# Patient Record
Sex: Female | Born: 1935 | Race: White | Hispanic: No | Smoking: Never smoker
Health system: Southern US, Community
[De-identification: ages and names within clinical notes are randomized; demographics above are authoritative.]

## PROBLEM LIST (undated history)

## (undated) DIAGNOSIS — E785 Hyperlipidemia, unspecified: Secondary | ICD-10-CM

## (undated) DIAGNOSIS — I714 Abdominal aortic aneurysm, without rupture, unspecified: Secondary | ICD-10-CM

## (undated) DIAGNOSIS — I442 Atrioventricular block, complete: Secondary | ICD-10-CM

## (undated) DIAGNOSIS — I10 Essential (primary) hypertension: Secondary | ICD-10-CM

## (undated) DIAGNOSIS — R42 Dizziness and giddiness: Secondary | ICD-10-CM

## (undated) HISTORY — PX: CHOLECYSTECTOMY: SHX55

## (undated) HISTORY — DX: Hyperlipidemia, unspecified: E78.5

## (undated) HISTORY — PX: TONSILLECTOMY: SUR1361

## (undated) HISTORY — PX: APPENDECTOMY: SHX54

---

## 2011-04-01 DIAGNOSIS — I517 Cardiomegaly: Secondary | ICD-10-CM

## 2011-04-01 DIAGNOSIS — I10 Essential (primary) hypertension: Secondary | ICD-10-CM

## 2012-06-13 DIAGNOSIS — R931 Abnormal findings on diagnostic imaging of heart and coronary circulation: Secondary | ICD-10-CM | POA: Insufficient documentation

## 2012-06-13 DIAGNOSIS — R011 Cardiac murmur, unspecified: Secondary | ICD-10-CM | POA: Insufficient documentation

## 2012-06-13 DIAGNOSIS — R9431 Abnormal electrocardiogram [ECG] [EKG]: Secondary | ICD-10-CM | POA: Insufficient documentation

## 2012-06-13 DIAGNOSIS — I4510 Unspecified right bundle-branch block: Secondary | ICD-10-CM | POA: Insufficient documentation

## 2015-05-26 DIAGNOSIS — I10 Essential (primary) hypertension: Secondary | ICD-10-CM | POA: Diagnosis not present

## 2015-05-26 DIAGNOSIS — Z1389 Encounter for screening for other disorder: Secondary | ICD-10-CM | POA: Diagnosis not present

## 2015-05-26 DIAGNOSIS — Z6821 Body mass index (BMI) 21.0-21.9, adult: Secondary | ICD-10-CM | POA: Diagnosis not present

## 2015-05-26 DIAGNOSIS — Z0000 Encounter for general adult medical examination without abnormal findings: Secondary | ICD-10-CM | POA: Diagnosis not present

## 2015-06-10 DIAGNOSIS — Z9049 Acquired absence of other specified parts of digestive tract: Secondary | ICD-10-CM | POA: Diagnosis not present

## 2015-06-10 DIAGNOSIS — R1084 Generalized abdominal pain: Secondary | ICD-10-CM | POA: Diagnosis not present

## 2015-06-10 DIAGNOSIS — I714 Abdominal aortic aneurysm, without rupture: Secondary | ICD-10-CM | POA: Diagnosis not present

## 2015-09-01 DIAGNOSIS — Z0000 Encounter for general adult medical examination without abnormal findings: Secondary | ICD-10-CM | POA: Diagnosis not present

## 2015-09-01 DIAGNOSIS — I10 Essential (primary) hypertension: Secondary | ICD-10-CM | POA: Diagnosis not present

## 2015-09-01 DIAGNOSIS — M810 Age-related osteoporosis without current pathological fracture: Secondary | ICD-10-CM | POA: Diagnosis not present

## 2015-09-01 DIAGNOSIS — Z6820 Body mass index (BMI) 20.0-20.9, adult: Secondary | ICD-10-CM | POA: Diagnosis not present

## 2015-09-01 DIAGNOSIS — R55 Syncope and collapse: Secondary | ICD-10-CM | POA: Diagnosis not present

## 2015-09-08 DIAGNOSIS — R55 Syncope and collapse: Secondary | ICD-10-CM | POA: Diagnosis not present

## 2015-09-08 DIAGNOSIS — I358 Other nonrheumatic aortic valve disorders: Secondary | ICD-10-CM | POA: Diagnosis not present

## 2015-09-08 DIAGNOSIS — G458 Other transient cerebral ischemic attacks and related syndromes: Secondary | ICD-10-CM | POA: Diagnosis not present

## 2015-09-08 DIAGNOSIS — I517 Cardiomegaly: Secondary | ICD-10-CM | POA: Diagnosis not present

## 2015-12-08 DIAGNOSIS — Z23 Encounter for immunization: Secondary | ICD-10-CM | POA: Diagnosis not present

## 2015-12-08 DIAGNOSIS — E784 Other hyperlipidemia: Secondary | ICD-10-CM | POA: Diagnosis not present

## 2015-12-08 DIAGNOSIS — Z6821 Body mass index (BMI) 21.0-21.9, adult: Secondary | ICD-10-CM | POA: Diagnosis not present

## 2015-12-08 DIAGNOSIS — M810 Age-related osteoporosis without current pathological fracture: Secondary | ICD-10-CM | POA: Diagnosis not present

## 2015-12-08 DIAGNOSIS — I10 Essential (primary) hypertension: Secondary | ICD-10-CM | POA: Diagnosis not present

## 2016-03-22 DIAGNOSIS — M810 Age-related osteoporosis without current pathological fracture: Secondary | ICD-10-CM | POA: Diagnosis not present

## 2016-03-22 DIAGNOSIS — Z6821 Body mass index (BMI) 21.0-21.9, adult: Secondary | ICD-10-CM | POA: Diagnosis not present

## 2016-03-22 DIAGNOSIS — E784 Other hyperlipidemia: Secondary | ICD-10-CM | POA: Diagnosis not present

## 2016-03-22 DIAGNOSIS — I10 Essential (primary) hypertension: Secondary | ICD-10-CM | POA: Diagnosis not present

## 2016-06-28 DIAGNOSIS — E784 Other hyperlipidemia: Secondary | ICD-10-CM | POA: Diagnosis not present

## 2016-06-28 DIAGNOSIS — I872 Venous insufficiency (chronic) (peripheral): Secondary | ICD-10-CM | POA: Diagnosis not present

## 2016-06-28 DIAGNOSIS — M810 Age-related osteoporosis without current pathological fracture: Secondary | ICD-10-CM | POA: Diagnosis not present

## 2016-06-28 DIAGNOSIS — I10 Essential (primary) hypertension: Secondary | ICD-10-CM | POA: Diagnosis not present

## 2016-10-11 DIAGNOSIS — E784 Other hyperlipidemia: Secondary | ICD-10-CM | POA: Diagnosis not present

## 2016-10-11 DIAGNOSIS — I10 Essential (primary) hypertension: Secondary | ICD-10-CM | POA: Diagnosis not present

## 2016-10-11 DIAGNOSIS — I872 Venous insufficiency (chronic) (peripheral): Secondary | ICD-10-CM | POA: Diagnosis not present

## 2016-10-11 DIAGNOSIS — M810 Age-related osteoporosis without current pathological fracture: Secondary | ICD-10-CM | POA: Diagnosis not present

## 2017-01-17 DIAGNOSIS — I872 Venous insufficiency (chronic) (peripheral): Secondary | ICD-10-CM | POA: Diagnosis not present

## 2017-01-17 DIAGNOSIS — I10 Essential (primary) hypertension: Secondary | ICD-10-CM | POA: Diagnosis not present

## 2017-01-17 DIAGNOSIS — E7849 Other hyperlipidemia: Secondary | ICD-10-CM | POA: Diagnosis not present

## 2017-01-17 DIAGNOSIS — M810 Age-related osteoporosis without current pathological fracture: Secondary | ICD-10-CM | POA: Diagnosis not present

## 2017-04-25 DIAGNOSIS — E7849 Other hyperlipidemia: Secondary | ICD-10-CM | POA: Diagnosis not present

## 2017-04-25 DIAGNOSIS — M810 Age-related osteoporosis without current pathological fracture: Secondary | ICD-10-CM | POA: Diagnosis not present

## 2017-04-25 DIAGNOSIS — I10 Essential (primary) hypertension: Secondary | ICD-10-CM | POA: Diagnosis not present

## 2017-04-25 DIAGNOSIS — N181 Chronic kidney disease, stage 1: Secondary | ICD-10-CM | POA: Diagnosis not present

## 2017-05-30 DIAGNOSIS — M4184 Other forms of scoliosis, thoracic region: Secondary | ICD-10-CM | POA: Diagnosis not present

## 2017-05-30 DIAGNOSIS — Z6822 Body mass index (BMI) 22.0-22.9, adult: Secondary | ICD-10-CM | POA: Diagnosis not present

## 2017-05-30 DIAGNOSIS — M4804 Spinal stenosis, thoracic region: Secondary | ICD-10-CM | POA: Diagnosis not present

## 2017-05-30 DIAGNOSIS — M545 Low back pain: Secondary | ICD-10-CM | POA: Diagnosis not present

## 2017-06-09 DIAGNOSIS — I700 Atherosclerosis of aorta: Secondary | ICD-10-CM | POA: Diagnosis not present

## 2017-07-10 ENCOUNTER — Inpatient Hospital Stay (HOSPITAL_COMMUNITY)
Admission: AD | Admit: 2017-07-10 | Discharge: 2017-07-13 | DRG: 244 | Disposition: A | Discharge status: Home / Self Care | Payer: Medicare Other | Source: Other Acute Inpatient Hospital | Attending: Internal Medicine | Admitting: Internal Medicine

## 2017-07-10 DIAGNOSIS — I700 Atherosclerosis of aorta: Secondary | ICD-10-CM | POA: Diagnosis not present

## 2017-07-10 DIAGNOSIS — Z79899 Other long term (current) drug therapy: Secondary | ICD-10-CM

## 2017-07-10 DIAGNOSIS — R55 Syncope and collapse: Secondary | ICD-10-CM | POA: Diagnosis present

## 2017-07-10 DIAGNOSIS — I10 Essential (primary) hypertension: Secondary | ICD-10-CM | POA: Diagnosis present

## 2017-07-10 DIAGNOSIS — J90 Pleural effusion, not elsewhere classified: Secondary | ICD-10-CM | POA: Diagnosis not present

## 2017-07-10 DIAGNOSIS — Z950 Presence of cardiac pacemaker: Secondary | ICD-10-CM | POA: Diagnosis not present

## 2017-07-10 DIAGNOSIS — R001 Bradycardia, unspecified: Secondary | ICD-10-CM | POA: Diagnosis not present

## 2017-07-10 DIAGNOSIS — I442 Atrioventricular block, complete: Principal | ICD-10-CM | POA: Diagnosis present

## 2017-07-10 DIAGNOSIS — R7989 Other specified abnormal findings of blood chemistry: Secondary | ICD-10-CM | POA: Diagnosis present

## 2017-07-10 DIAGNOSIS — E785 Hyperlipidemia, unspecified: Secondary | ICD-10-CM | POA: Diagnosis present

## 2017-07-10 DIAGNOSIS — I4891 Unspecified atrial fibrillation: Secondary | ICD-10-CM | POA: Diagnosis not present

## 2017-07-10 DIAGNOSIS — R42 Dizziness and giddiness: Secondary | ICD-10-CM | POA: Diagnosis not present

## 2017-07-10 DIAGNOSIS — Z8249 Family history of ischemic heart disease and other diseases of the circulatory system: Secondary | ICD-10-CM

## 2017-07-10 DIAGNOSIS — I714 Abdominal aortic aneurysm, without rupture: Secondary | ICD-10-CM | POA: Diagnosis not present

## 2017-07-10 DIAGNOSIS — J9811 Atelectasis: Secondary | ICD-10-CM | POA: Diagnosis not present

## 2017-07-10 DIAGNOSIS — K449 Diaphragmatic hernia without obstruction or gangrene: Secondary | ICD-10-CM | POA: Diagnosis not present

## 2017-07-10 DIAGNOSIS — Z7983 Long term (current) use of bisphosphonates: Secondary | ICD-10-CM

## 2017-07-10 DIAGNOSIS — E7800 Pure hypercholesterolemia, unspecified: Secondary | ICD-10-CM | POA: Diagnosis not present

## 2017-07-10 HISTORY — DX: Abdominal aortic aneurysm, without rupture, unspecified: I71.40

## 2017-07-10 HISTORY — DX: Essential (primary) hypertension: I10

## 2017-07-10 HISTORY — DX: Dizziness and giddiness: R42

## 2017-07-10 HISTORY — DX: Abdominal aortic aneurysm, without rupture: I71.4

## 2017-07-10 HISTORY — DX: Atrioventricular block, complete: I44.2

## 2017-07-11 ENCOUNTER — Other Ambulatory Visit: Payer: Self-pay

## 2017-07-11 ENCOUNTER — Encounter (HOSPITAL_COMMUNITY): Payer: Self-pay | Admitting: Cardiology

## 2017-07-11 DIAGNOSIS — E785 Hyperlipidemia, unspecified: Secondary | ICD-10-CM

## 2017-07-11 DIAGNOSIS — I442 Atrioventricular block, complete: Principal | ICD-10-CM | POA: Diagnosis present

## 2017-07-11 DIAGNOSIS — I10 Essential (primary) hypertension: Secondary | ICD-10-CM

## 2017-07-11 LAB — BASIC METABOLIC PANEL
ANION GAP: 8 (ref 5–15)
Anion gap: 10 (ref 5–15)
BUN: 29 mg/dL — ABNORMAL HIGH (ref 6–20)
BUN: 31 mg/dL — AB (ref 6–20)
CHLORIDE: 110 mmol/L (ref 101–111)
CHLORIDE: 111 mmol/L (ref 101–111)
CO2: 24 mmol/L (ref 22–32)
CO2: 25 mmol/L (ref 22–32)
CREATININE: 1.56 mg/dL — AB (ref 0.44–1.00)
Calcium: 9.6 mg/dL (ref 8.9–10.3)
Calcium: 9.2 mg/dL (ref 8.9–10.3)
Creatinine, Ser: 1.39 mg/dL — ABNORMAL HIGH (ref 0.44–1.00)
GFR calc Af Amer: 34 mL/min — ABNORMAL LOW (ref >60)
GFR calc non Af Amer: 29 mL/min — ABNORMAL LOW (ref >60)
GFR calc non Af Amer: 34 mL/min — ABNORMAL LOW (ref >60)
GFR, EST AFRICAN AMERICAN: 39 mL/min — AB (ref >60)
GLUCOSE: 119 mg/dL — AB (ref 65–99)
Glucose, Bld: 97 mg/dL (ref 65–99)
POTASSIUM: 4.8 mmol/L (ref 3.5–5.1)
Potassium: 5 mmol/L (ref 3.5–5.1)
SODIUM: 144 mmol/L (ref 135–145)
Sodium: 144 mmol/L (ref 135–145)

## 2017-07-11 LAB — CBC
HCT: 34.2 % — ABNORMAL LOW (ref 36.0–46.0)
HEMATOCRIT: 32 % — AB (ref 36.0–46.0)
HEMOGLOBIN: 10 g/dL — AB (ref 12.0–15.0)
Hemoglobin: 10.8 g/dL — ABNORMAL LOW (ref 12.0–15.0)
MCH: 28.5 pg (ref 26.0–34.0)
MCH: 29 pg (ref 26.0–34.0)
MCHC: 31.3 g/dL (ref 30.0–36.0)
MCHC: 31.6 g/dL (ref 30.0–36.0)
MCV: 91.2 fL (ref 78.0–100.0)
MCV: 91.9 fL (ref 78.0–100.0)
PLATELETS: 242 10*3/uL (ref 150–400)
Platelets: 235 10*3/uL (ref 150–400)
RBC: 3.72 MIL/uL — ABNORMAL LOW (ref 3.87–5.11)
RBC: 3.51 MIL/uL — AB (ref 3.87–5.11)
RDW: 13.3 % (ref 11.5–15.5)
RDW: 13.4 % (ref 11.5–15.5)
WBC: 11.3 10*3/uL — ABNORMAL HIGH (ref 4.0–10.5)
WBC: 7.9 10*3/uL (ref 4.0–10.5)

## 2017-07-11 LAB — HEMOGLOBIN A1C
Hgb A1c MFr Bld: 5.9 % — ABNORMAL HIGH (ref 4.8–5.6)
Mean Plasma Glucose: 122.63 mg/dL

## 2017-07-11 LAB — SURGICAL PCR SCREEN
MRSA, PCR: NEGATIVE
Staphylococcus aureus: NEGATIVE

## 2017-07-11 LAB — MAGNESIUM: MAGNESIUM: 2.2 mg/dL (ref 1.7–2.4)

## 2017-07-11 LAB — PROTIME-INR
INR: 1.05
Prothrombin Time: 13.6 seconds (ref 11.4–15.2)

## 2017-07-11 LAB — MRSA PCR SCREENING: MRSA by PCR: NEGATIVE

## 2017-07-11 LAB — LIPID PANEL
CHOLESTEROL: 148 mg/dL (ref 0–200)
HDL: 42 mg/dL (ref >40)
LDL Cholesterol: 73 mg/dL (ref 0–99)
TRIGLYCERIDES: 167 mg/dL — AB (ref <150)
Total CHOL/HDL Ratio: 3.5 RATIO
VLDL: 33 mg/dL (ref 0–40)

## 2017-07-11 LAB — TSH: TSH: 2.004 u[IU]/mL (ref 0.350–4.500)

## 2017-07-11 LAB — BRAIN NATRIURETIC PEPTIDE: B Natriuretic Peptide: 2333.8 pg/mL — ABNORMAL HIGH (ref 0.0–100.0)

## 2017-07-11 MED ORDER — CEFAZOLIN SODIUM-DEXTROSE 1-4 GM/50ML-% IV SOLN
1.0000 g | INTRAVENOUS | Status: AC
  Administered 2017-07-12: 1 g via INTRAVENOUS

## 2017-07-11 MED ORDER — CALCIUM CARBONATE-VITAMIN D 500-200 MG-UNIT PO TABS
1.0000 | ORAL_TABLET | Freq: Every day | ORAL | Status: DC
  Administered 2017-07-11 – 2017-07-13 (×3): 1 via ORAL
  Filled 2017-07-11 (×3): qty 1

## 2017-07-11 MED ORDER — ACETAMINOPHEN 325 MG PO TABS
650.0000 mg | ORAL_TABLET | ORAL | Status: DC | PRN
  Administered 2017-07-11 – 2017-07-13 (×4): 650 mg via ORAL
  Filled 2017-07-11 (×4): qty 2

## 2017-07-11 MED ORDER — LISINOPRIL 10 MG PO TABS
10.0000 mg | ORAL_TABLET | Freq: Every day | ORAL | Status: DC
  Administered 2017-07-12 – 2017-07-13 (×3): 10 mg via ORAL
  Filled 2017-07-11 (×3): qty 1

## 2017-07-11 MED ORDER — ASPIRIN 81 MG PO CHEW: 81.0000 mg | CHEWABLE_TABLET | Freq: Every day | ORAL | Status: DC

## 2017-07-11 MED ORDER — NITROGLYCERIN 0.4 MG SL SUBL: 0.4000 mg | SUBLINGUAL_TABLET | SUBLINGUAL | Status: DC | PRN

## 2017-07-11 MED ORDER — HEPARIN SODIUM (PORCINE) 5000 UNIT/ML IJ SOLN
5000.0000 [IU] | Freq: Three times a day (TID) | INTRAMUSCULAR | Status: DC
  Administered 2017-07-11 – 2017-07-13 (×5): 5000 [IU] via SUBCUTANEOUS
  Filled 2017-07-11 (×5): qty 1

## 2017-07-11 MED ORDER — HYDRALAZINE HCL 25 MG PO TABS
25.0000 mg | ORAL_TABLET | Freq: Three times a day (TID) | ORAL | Status: DC | PRN
Start: 2017-07-11 — End: 2017-07-13
  Administered 2017-07-11 – 2017-07-13 (×4): 25 mg via ORAL
  Filled 2017-07-11 (×4): qty 1

## 2017-07-11 MED ORDER — PRAVASTATIN SODIUM 40 MG PO TABS
40.0000 mg | ORAL_TABLET | Freq: Every day | ORAL | Status: DC
  Administered 2017-07-11 – 2017-07-12 (×2): 40 mg via ORAL
  Filled 2017-07-11 (×2): qty 1

## 2017-07-11 MED ORDER — ASPIRIN EC 81 MG PO TBEC
81.0000 mg | DELAYED_RELEASE_TABLET | Freq: Every day | ORAL | Status: DC
  Administered 2017-07-12 – 2017-07-13 (×2): 81 mg via ORAL
  Filled 2017-07-11 (×2): qty 1

## 2017-07-11 MED ORDER — ONDANSETRON HCL 4 MG/2ML IJ SOLN
4.0000 mg | Freq: Four times a day (QID) | INTRAMUSCULAR | Status: DC | PRN
  Administered 2017-07-12: 4 mg via INTRAVENOUS

## 2017-07-11 MED ORDER — OMEGA-3-ACID ETHYL ESTERS 1 G PO CAPS
1.0000 g | ORAL_CAPSULE | Freq: Every day | ORAL | Status: DC
  Administered 2017-07-11 – 2017-07-13 (×3): 1 g via ORAL
  Filled 2017-07-11 (×3): qty 1

## 2017-07-11 MED ORDER — SODIUM CHLORIDE 0.9 % IV SOLN
INTRAVENOUS | Status: DC
  Administered 2017-07-12: 07:00:00 via INTRAVENOUS

## 2017-07-11 MED ORDER — SODIUM CHLORIDE 0.9 % IV SOLN
80.0000 mg | INTRAVENOUS | Status: AC
  Administered 2017-07-12: 80 mg
  Filled 2017-07-11: qty 2

## 2017-07-11 NOTE — Progress Notes (Signed)
EKG CRITICAL VALUE     12 lead EKG performed.  Critical value noted.Vinnie Langton , RN notified.   Irish Elders, [place] 07/11/2017 8:02 AM

## 2017-07-11 NOTE — H&P (Signed)
Cardiology Admission History and Physical:   Patient ID: Deborah Moses; MRN: [id number]; DOB: 1931/08/03   Admission date: 07/10/2017  Primary Care Provider: Toma Deiters, MD Primary Cardiologist: Titus Mould [facility])  Chief Complaint:  CHB  Patient Profile:   Deborah Moses is a 84 y.o. female with a history of HTN, vertigo, AAA, who presented to OSH with presyncope and is found to have complete heart block.   History of Present Illness:   Deborah Moses is a 84 y.o. female with a history of HTN, vertigo, AAA, who presented to OSH with presyncope and is found to have complete heart block.   The patient has a largely unremarkable cardiac history but was evaluated by cardiology at [facility] previously after she had abnormal echocardiogram that apparently showed unspecified wall motion abnormalities. Conservative management was taken without further workup due to absence of symptoms. She also has known conduction disease with baseline RBBB.  She presented to [facility] today with episodes of lightheadedness and presyncope. She felt dizzy when ambulating and would have to sit down. She thought that she was experiencing a recurrence of her vertigo. She denied associated chest pain, dyspnea, edema or other cardiac symptoms. At the OSH ED, she was noted to be bradycardic to the 30s. ECG was obtained and showed complete heart block. CT head was performed and unremarkable, CXR showed L atelectasis with minimal L pleural effusion. Labs notable for Cr 1.7. Troponin negative.  She was transferred to [facility] for further management.  On arrival to [facility], the patient had HR 30s with SBP 150s. ECG showed complete heart block with variable RBBB-pattern escape. She is resting comfortably in bed and denies any active complaints.   Past Medical History:  Diagnosis Date  . AAA (abdominal aortic aneurysm) (HCC)   . Heart block AV third degree (HCC)   . HTN (hypertension)   . Vertigo       Medications Prior to Admission: Prior to Admission medications   Medication Sig Start Date End Date Taking? Authorizing Provider  alendronate (FOSAMAX) 70 MG tablet Take 70 mg by mouth once a week. Take with a full glass of water on an empty stomach.    [provider]  amLODipine (NORVASC) 5 MG tablet Take 5 mg by mouth daily.    [provider]  aspirin 81 MG chewable tablet Chew by mouth daily.    [provider]  calcium-vitamin D (OSCAL WITH D) 500-200 MG-UNIT TABS tablet Take by mouth.    [provider]  lisinopril (PRINIVIL,ZESTRIL) 20 MG tablet Take 20 mg by mouth daily.    [provider]  lovastatin (MEVACOR) 20 MG tablet Take 20 mg by mouth at bedtime.    [provider]  metoprolol succinate (TOPROL-XL) 25 MG 24 hr tablet Take 25 mg by mouth daily.    [provider]  Omega-3 Fatty Acids (FISH OIL) 1000 MG CPDR Take by mouth.    [provider]     Allergies:   No Known Allergies  Social History:   Social History   Socioeconomic History  . Marital status: Widowed    Spouse name: Not on file  . Number of children: 4  . Years of education: Not on file  . Highest education level: Not on file  Occupational History  . Not on file  Social Needs  . Financial resource strain: Not on file  . Food insecurity:    Worry: Not on file    Inability:  Not on file  . Transportation needs:    Medical: Not on file    Non-medical: Not on file  Tobacco Use  . Smoking status: Never Smoker  . Smokeless tobacco: Never Used  Substance and Sexual Activity  . Alcohol use: Never    Frequency: Never  . Drug use: Not on file  . Sexual activity: Not on file  Lifestyle  . Physical activity:    Days per week: Not on file    Minutes per session: Not on file  . Stress: Not on file  Relationships  . Social connections:    Talks on phone: Not on file    Gets together: Not on file    Attends religious service:  Not on file    Active member of club or organization: Not on file    Attends meetings of clubs or organizations: Not on file    Relationship status: Not on file  . Intimate partner violence:    Fear of current or ex partner: Not on file    Emotionally abused: Not on file    Physically abused: Not on file    Forced sexual activity: Not on file  Other Topics Concern  . Not on file  Social History Narrative  . Not on file    Family History:  The patient's family history includes Heart failure in her father.    ROS:  Please see the history of present illness.  All other ROS reviewed and negative.     Physical Exam/Data:   Vitals:   07/11/17 0000  Weight: 56.6 kg (124 lb 12.5 oz)  Height:  (1.575 m)   No intake or output data in the 24 hours ending 07/11/17 0057 Filed Weights   07/11/17 0000  Weight: 56.6 kg (124 lb 12.5 oz)   Body mass index is 22.82 kg/m.  General:  Well nourished, well developed, in no acute distress HEENT: poor dentition Lymph: no adenopathy Neck: no JVD. Cannon A waves present Vascular: 2+ radial pulse Cardiac:  Bradycardic, regular, II/VI systolic murmur  Lungs:  Some coarse breath sounds bilaterally Abd: soft, nontender Ext: no LE edema Musculoskeletal:  No deformities, BUE and BLE strength normal and equal Skin: warm and dry  Neuro:  No focal abnormalities noted Psych:  Normal affect    EKG:  The ECG that was done and was personally reviewed and demonstrates findings as above  Relevant CV Studies: TTE ~2012 (CareEverywhere): Ejection fraction 6065% with segmental wall motion abnormalities.   Laboratory Data:  ChemistryNo results for input(s): NA, K, CL, CO2, GLUCOSE, BUN, CREATININE, CALCIUM, GFRNONAA, GFRAA, ANIONGAP in the last 168 hours.  No results for input(s): PROT, ALBUMIN, AST, ALT, ALKPHOS, BILITOT in the last 168 hours. HematologyNo results for input(s): WBC, RBC, HGB, HCT, MCV, MCH, MCHC, RDW, PLT in the last 168  hours. Cardiac EnzymesNo results for input(s): TROPONINI in the last 168 hours. No results for input(s): TROPIPOC in the last 168 hours.  BNPNo results for input(s): BNP, PROBNP in the last 168 hours.  DDimer No results for input(s): DDIMER in the last 168 hours.  Radiology/Studies:  No results found.  Assessment and Plan:   Presyncope Complete heart block The patient has a history of conductive disease and presents as transfer from OSH ED, where she presented with syncope and is found to have complete heart block. She denies any active complaints and is hemodynamically stable with a stable escape. She will need PPM, which was discussed with her this  evening.  -Continue to monitor on telemetry -Hold nodal blockade -Echocardiogram ordered to evaluate for EF for device type planning -NPO for PPM. Will need cath lab activation for temporary pacer if she becomes unstable -Pads currently on patient.  Elevated Cr Cr 1.7 at OSH, baseline unclear. Repeat order pending here -Continue to monitor  HTN BP stable at this time -Will not order home antihypertensives at this time given tenuous state  AAA Reportedly measured at 4.6 cm at outside facility. Has pending outpatient vascular appointment.   HLD -Continue statin, fish oil   Severity of Illness: The appropriate patient status for this patient is INPATIENT. Inpatient status is judged to be reasonable and necessary in order to provide the required intensity of service to ensure the patient's safety. The patient's presenting symptoms, physical exam findings, and initial radiographic and laboratory data in the context of their chronic comorbidities is felt to place them at high risk for further clinical deterioration. Furthermore, it is not anticipated that the patient will be medically stable for discharge from the hospital within 2 midnights of admission. The following factors support the patient status of inpatient.   " The patient's  presenting symptoms include presyncope. " The worrisome physical exam findings include bradycardia. " The initial radiographic and laboratory data are worrisome because of CHB. " The chronic co-morbidities include HTN.   * I certify that at the point of admission it is my clinical judgment that the patient will require inpatient hospital care spanning beyond 2 midnights from the point of admission due to high intensity of service, high risk for further deterioration and high frequency of surveillance required.*    For questions or updates, please contact [facility] Please consult [email or web address] for contact info under Cardiology/STEMI.    Signed, Ernest Mallick, MD  07/11/2017 12:57 AM

## 2017-07-11 NOTE — Consult Note (Signed)
Cardiology Admission History and Physical:   Patient ID: Brandyn Thien; MRN: [id number]; DOB: [birth year]   Admission date: 07/10/2017  Primary Care Provider: Toma Deiters, MD Primary Cardiologist: Titus Mould [facility])  Chief Complaint:  CHB  Patient Profile:   Kyrielle Urbanski is a 84 y.o. female with a history of HTN, vertigo, AAA, who presented to OSH with presyncope and is found to have complete heart block.   History of Present Illness:   Talene Glastetter is a 84 y.o. female with a history of HTN, vertigo, AAA, who presented to OSH with presyncope and is found to have complete heart block. She is referred by Dr. Donnie Aho for additional evaluation.   The patient has a largely unremarkable cardiac history but was evaluated by cardiology at [facility] previously after she had abnormal echocardiogram that apparently showed unspecified wall motion abnormalities. Conservative management was taken without further workup due to absence of symptoms. She also has known conduction disease with baseline RBBB.  She presented to [facility] today with episodes of lightheadedness and presyncope. She felt dizzy when ambulating and would have to sit down. She thought that she was experiencing a recurrence of her vertigo. She denied associated chest pain, dyspnea, edema or other cardiac symptoms. At the OSH ED, she was noted to be bradycardic to the 30s. ECG was obtained and showed complete heart block. CT head was performed and unremarkable, CXR showed L atelectasis with minimal L pleural effusion. Labs notable for Cr 1.7. Troponin negative.  She was transferred to [facility] for further management.  On arrival to [facility], the patient had HR 30s with SBP 150s. ECG showed complete heart block with variable RBBB-pattern escape. She is resting comfortably in bed and denies any active complaints.       Past Medical History:  Diagnosis Date  . AAA (abdominal aortic aneurysm) (HCC)   .  Heart block AV third degree (HCC)   . HTN (hypertension)   . Vertigo      Medications Prior to Admission:        Prior to Admission medications   Medication Sig Start Date End Date Taking? Authorizing Provider  alendronate (FOSAMAX) 70 MG tablet Take 70 mg by mouth once a week. Take with a full glass of water on an empty stomach.    [provider]  amLODipine (NORVASC) 5 MG tablet Take 5 mg by mouth daily.    [provider]  aspirin 81 MG chewable tablet Chew by mouth daily.    [provider]  calcium-vitamin D (OSCAL WITH D) 500-200 MG-UNIT TABS tablet Take by mouth.    [provider]  lisinopril (PRINIVIL,ZESTRIL) 20 MG tablet Take 20 mg by mouth daily.    [provider]  lovastatin (MEVACOR) 20 MG tablet Take 20 mg by mouth at bedtime.    [provider]  metoprolol succinate (TOPROL-XL) 25 MG 24 hr tablet Take 25 mg by mouth daily.    [provider]  Omega-3 Fatty Acids (FISH OIL) 1000 MG CPDR Take by mouth.    [provider]     Allergies:   No Known Allergies  Social History:   Social History        Socioeconomic History  . Marital status: Widowed    Spouse name: Not on file  . Number of children: 4  . Years of education: Not on file  . Highest education level: Not on file  Occupational History  . Not on file  Social Needs  . Financial resource strain: Not on file  . Food insecurity:    Worry: Not on file    Inability: Not on file  . Transportation needs:    Medical: Not on file    Non-medical: Not on file  Tobacco Use  . Smoking status: Never Smoker  . Smokeless tobacco: Never Used  Substance and Sexual Activity  . Alcohol use: Never    Frequency: Never  . Drug use: Not on file  . Sexual activity: Not on file  Lifestyle  . Physical activity:    Days per week: Not on file    Minutes per session: Not on file  . Stress: Not on file    Relationships  . Social connections:    Talks on phone: Not on file    Gets together: Not on file    Attends religious service: Not on file    Active member of club or organization: Not on file    Attends meetings of clubs or organizations: Not on file    Relationship status: Not on file  . Intimate partner violence:    Fear of current or ex partner: Not on file    Emotionally abused: Not on file    Physically abused: Not on file    Forced sexual activity: Not on file  Other Topics Concern  . Not on file  Social History Narrative  . Not on file    Family History:  The patient's family history includes Heart failure in her father.    ROS:  Please see the history of present illness.  All other ROS reviewed and negative.     Physical Exam/Data:      Vitals:   07/11/17 0000  Weight: 56.6 kg (124 lb 12.5 oz)  Height:  (1.575 m)   No intake or output data in the 24 hours ending 07/11/17 0057    Filed Weights   07/11/17 0000  Weight: 56.6 kg (124 lb 12.5 oz)   Body mass index is 22.82 kg/m.  General:  Well nourished, well developed, in no acute distress HEENT: poor dentition Lymph: no adenopathy Neck: no JVD. Cannon A waves present Vascular: 2+ radial pulse Cardiac:  Bradycardic, regular, II/VI systolic murmur  Lungs:  Some coarse breath sounds bilaterally Abd: soft, nontender Ext: no LE edema Musculoskeletal:  No deformities, BUE and BLE strength normal and equal Skin: warm and dry  Neuro:  No focal abnormalities noted Psych:  Normal affect    EKG:  NSR with CHB and RBBB escape  Relevant CV Studies: TTE ~2012 (CareEverywhere): Ejection fraction 6065% with segmental wall motion abnormalities.   Laboratory Data:  Chemistry [facility]  No results for input(s): NA, K, CL, CO2, GLUCOSE, BUN, CREATININE, CALCIUM, GFRNONAA, GFRAA, ANIONGAP in the last 168 hours.    [facility]  No results for input(s): PROT, ALBUMIN, AST,  ALT, ALKPHOS, BILITOT in the last 168 hours.   Hematology [facility]  No results for input(s): WBC, RBC, HGB, HCT, MCV, MCH, MCHC, RDW, PLT in the last 168 hours.   Cardiac Enzymes [employer]  No results for input(s): TROPONINI in the last 168 hours.    [employer]  No results for input(s): TROPIPOC in the last 168 hours.    BNP [employer]  No results for input(s): BNP, PROBNP in the last 168 hours.    DDimer  Beau Fanny  No results for input(s): DDIMER in the last 168 hours.    Radiology/Studies:  No results found.  Assessment and  Plan:   Presyncope Complete heart block The patient has a history of conductive disease and presents as transfer from OSH ED, where she presented with syncope and is found to have complete heart block. She denies any active complaints and is hemodynamically stable with a stable escape. She will need PPM, which was discussed with her this evening.  -Continue to monitor on telemetry -Hold nodal blockade -Echocardiogram ordered to evaluate for EF for device type planning -NPO for PPM. Will need cath lab activation for temporary pacer if she becomes unstable -Pads currently on patient.  Elevated Cr Cr 1.7 at OSH, baseline unclear. Repeat order pending here -Continue to monitor  HTN BP stable at this time -Will not order home antihypertensives at this time given tenuous state  AAA Reportedly measured at 4.6 cm at outside facility. Has pending outpatient vascular appointment.   HLD -Continue statin, fish oil   I have reviewed the indications/risks/benefits/goals/expectations of PPM insertion with the patient and her family and they wish to proceed. We will plan to perform tomorrow morning.   [name]

## 2017-07-12 ENCOUNTER — Encounter (HOSPITAL_COMMUNITY): Payer: Self-pay

## 2017-07-12 ENCOUNTER — Inpatient Hospital Stay (HOSPITAL_COMMUNITY): Payer: Medicare Other

## 2017-07-12 ENCOUNTER — Inpatient Hospital Stay (HOSPITAL_COMMUNITY)
Admission: AD | Disposition: A | Discharge status: Home / Self Care | Payer: Self-pay | Source: Other Acute Inpatient Hospital | Attending: Cardiology

## 2017-07-12 DIAGNOSIS — I10 Essential (primary) hypertension: Secondary | ICD-10-CM

## 2017-07-12 HISTORY — PX: PACEMAKER IMPLANT: EP1218

## 2017-07-12 LAB — ECHOCARDIOGRAM COMPLETE
Height: 62 in
WEIGHTICAEL: 1996.49 [oz_av]

## 2017-07-12 LAB — CBC
HCT: 32.2 % — ABNORMAL LOW (ref 36.0–46.0)
Hemoglobin: 9.9 g/dL — ABNORMAL LOW (ref 12.0–15.0)
MCH: 28.6 pg (ref 26.0–34.0)
MCHC: 30.7 g/dL (ref 30.0–36.0)
MCV: 93.1 fL (ref 78.0–100.0)
PLATELETS: 230 10*3/uL (ref 150–400)
RBC: 3.46 MIL/uL — ABNORMAL LOW (ref 3.87–5.11)
RDW: 13.6 % (ref 11.5–15.5)
WBC: 8.7 10*3/uL (ref 4.0–10.5)

## 2017-07-12 LAB — BASIC METABOLIC PANEL
ANION GAP: 8 (ref 5–15)
BUN: 37 mg/dL — ABNORMAL HIGH (ref 6–20)
CALCIUM: 9 mg/dL (ref 8.9–10.3)
CO2: 24 mmol/L (ref 22–32)
Chloride: 111 mmol/L (ref 101–111)
Creatinine, Ser: 1.43 mg/dL — ABNORMAL HIGH (ref 0.44–1.00)
GFR calc non Af Amer: 33 mL/min — ABNORMAL LOW (ref >60)
GFR, EST AFRICAN AMERICAN: 38 mL/min — AB (ref >60)
Glucose, Bld: 110 mg/dL — ABNORMAL HIGH (ref 65–99)
POTASSIUM: 4.5 mmol/L (ref 3.5–5.1)
Sodium: 143 mmol/L (ref 135–145)

## 2017-07-12 SURGERY — PACEMAKER IMPLANT
Anesthesia: LOCAL

## 2017-07-12 MED ORDER — HEPARIN (PORCINE) IN NACL 2-0.9 UNITS/ML
INTRAMUSCULAR | Status: AC | PRN
  Administered 2017-07-12: 500 mL

## 2017-07-12 MED ORDER — FENTANYL CITRATE (PF) 100 MCG/2ML IJ SOLN
INTRAMUSCULAR | Status: AC
  Filled 2017-07-12: qty 2

## 2017-07-12 MED ORDER — ONDANSETRON HCL 4 MG/2ML IJ SOLN: 4.0000 mg | Freq: Four times a day (QID) | INTRAMUSCULAR | Status: DC | PRN

## 2017-07-12 MED ORDER — YOU HAVE A PACEMAKER BOOK
Freq: Once | Status: AC
  Administered 2017-07-12: 07:00:00
  Filled 2017-07-12: qty 1

## 2017-07-12 MED ORDER — ONDANSETRON HCL 4 MG/2ML IJ SOLN
INTRAMUSCULAR | Status: AC
  Filled 2017-07-12: qty 2

## 2017-07-12 MED ORDER — IOPAMIDOL (ISOVUE-370) INJECTION 76%
INTRAVENOUS | Status: AC
  Filled 2017-07-12: qty 50

## 2017-07-12 MED ORDER — FENTANYL CITRATE (PF) 100 MCG/2ML IJ SOLN
INTRAMUSCULAR | Status: DC | PRN
  Administered 2017-07-12: 12.5 ug via INTRAVENOUS

## 2017-07-12 MED ORDER — OFF THE BEAT BOOK
Freq: Once | Status: AC
  Administered 2017-07-12: 07:00:00
  Filled 2017-07-12: qty 1

## 2017-07-12 MED ORDER — CEFAZOLIN SODIUM-DEXTROSE 1-4 GM/50ML-% IV SOLN
1.0000 g | Freq: Four times a day (QID) | INTRAVENOUS | Status: AC
  Administered 2017-07-12 – 2017-07-13 (×3): 1 g via INTRAVENOUS
  Filled 2017-07-12 (×4): qty 50

## 2017-07-12 MED ORDER — LIDOCAINE HCL (PF) 1 % IJ SOLN
INTRAMUSCULAR | Status: DC | PRN
  Administered 2017-07-12: 60 mL

## 2017-07-12 MED ORDER — GENTAMICIN SULFATE 40 MG/ML IJ SOLN
INTRAMUSCULAR | Status: AC
  Filled 2017-07-12: qty 2

## 2017-07-12 MED ORDER — CHLORHEXIDINE GLUCONATE 4 % EX LIQD
CUTANEOUS | Status: AC
  Administered 2017-07-12: 09:00:00 via TOPICAL
  Filled 2017-07-12: qty 60

## 2017-07-12 MED ORDER — IOPAMIDOL (ISOVUE-370) INJECTION 76%
INTRAVENOUS | Status: DC | PRN
  Administered 2017-07-12: 10 mL via INTRAVENOUS

## 2017-07-12 MED ORDER — ACETAMINOPHEN 325 MG PO TABS
325.0000 mg | ORAL_TABLET | ORAL | Status: DC | PRN
  Administered 2017-07-12: 650 mg via ORAL
  Filled 2017-07-12: qty 2

## 2017-07-12 MED ORDER — CEFAZOLIN SODIUM-DEXTROSE 2-4 GM/100ML-% IV SOLN
INTRAVENOUS | Status: AC
  Filled 2017-07-12: qty 100

## 2017-07-12 MED ORDER — MIDAZOLAM HCL 5 MG/5ML IJ SOLN
INTRAMUSCULAR | Status: AC
  Filled 2017-07-12: qty 5

## 2017-07-12 SURGICAL SUPPLY — 12 items
CABLE SURGICAL S-101-97-12 (CABLE) ×4 IMPLANT
CATH RIGHTSITE C315HIS02 (CATHETERS) ×2 IMPLANT
IPG PACE AZUR XT DR MRI W1DR01 (Pacemaker) ×1 IMPLANT
LEAD CAPSURE NOVUS 5076-52CM (Lead) ×2 IMPLANT
LEAD SELECT SECURE 3830 383069 (Lead) ×1 IMPLANT
PACE AZURE XT DR MRI W1DR01 (Pacemaker) ×2 IMPLANT
PAD DEFIB LIFELINK (PAD) ×2 IMPLANT
SELECT SECURE 3830 383069 (Lead) ×2 IMPLANT
SHEATH CLASSIC 7F (SHEATH) ×4 IMPLANT
SLITTER 6232ADJ (MISCELLANEOUS) ×2 IMPLANT
TRAY PACEMAKER INSERTION (PACKS) ×2 IMPLANT
WIRE HI TORQ VERSACORE-J 145CM (WIRE) ×2 IMPLANT

## 2017-07-12 NOTE — Interval H&P Note (Signed)
History and Physical Interval Note:  07/12/2017 9:40 AM  Deborah Moses  has presented today for surgery, with the diagnosis of CHB  The various methods of treatment have been discussed with the patient and family. After consideration of risks, benefits and other options for treatment, the patient has consented to  Procedure(s): PACEMAKER IMPLANT (N/A) as a surgical intervention .  The patient's history has been reviewed, patient examined, no change in status, stable for surgery.  I have reviewed the patient's chart and labs.  Questions were answered to the patient's satisfaction.     Lewayne Bunting

## 2017-07-12 NOTE — Progress Notes (Signed)
Echocardiogram 2D Echocardiogram has been performed.  Pieter Partridge 07/12/2017, 8:12 AM

## 2017-07-12 NOTE — Progress Notes (Signed)
On call MD notified and updated regarding patients elevated BP despite Hydralazine and Lisinopril administration. Patients baseline with CHB from day shift to night shift has been noted to sustain elevated BP. BP systolic dopplered for confirmation and MD aware of period where systolic doppler was noted at 161W, which has since returned back to 150s-160s after Hydralazine administration. Manual BP assessed hourly at times due to bedside monitor having difficulty reading with such low HR. Patient remains asymptomatic with CHB (HR 27-35). Patient alert and oriented, no acute distress noted. No further orders received at this time for hypertension with concern of hypotension that may negatively affect patients ability to compensate for low HR. Will continue to monitor.

## 2017-07-12 NOTE — Progress Notes (Addendum)
Progress Note  Patient Name: Deborah Moses Date of Encounter: 07/12/2017  Primary Cardiologist: Dr. Titus Mould [facility])  Subjective   No dizziness, weakness at rest, no CP, palpitations or SOB here  Inpatient Medications    Scheduled Meds: . aspirin EC  81 mg Oral Daily  . calcium-vitamin D  1 tablet Oral Q breakfast  . chlorhexidine   Topical STAT  . gentamicin irrigation  80 mg Irrigation On Call  . heparin  5,000 Units Subcutaneous Q8H  . lisinopril  10 mg Oral Daily  . omega-3 acid ethyl esters  1 g Oral Daily  . pravastatin  40 mg Oral q1800   Continuous Infusions: . sodium chloride 50 mL/hr at 07/12/17 0640  .  ceFAZolin (ANCEF) IV     PRN Meds: acetaminophen, hydrALAZINE, nitroGLYCERIN, ondansetron (ZOFRAN) IV   Vital Signs    Vitals:   07/12/17 0320 07/12/17 0400 07/12/17 0426 07/12/17 0630  BP:   (!) 156/50 (!) 154/52  Pulse: (!) 29  (!) 30 (!) 29  Resp: 17  (!) 21 (!) 22  Temp:  97.9 F (36.6 C)    TempSrc:  Oral    SpO2: 93%  96% 96%  Weight:      Height:        Intake/Output Summary (Last 24 hours) at 07/12/2017 0845 Last data filed at 07/12/2017 0600 Gross per 24 hour  Intake 60 ml  Output 400 ml  Net -340 ml   Filed Weights   07/11/17 0000  Weight: 124 lb 12.5 oz (56.6 kg)    Telemetry    CHB, 30's, narrow QRS, occ PVCs - Personally Reviewed  ECG    No new EKGs - Personally Reviewed  Physical Exam   GEN: No acute distress, frail/thin body habitus  Neck: No JVD Cardiac: RRR, bradycardic, no murmurs, rubs, or gallops.  Respiratory: CTA b/l. GI: Soft, nontender, non-distended  MS: No edema; No deformity. Neuro:  Nonfocal  Psych: Normal affect   Labs    Chemistry Recent Labs  Lab 07/11/17 0028 07/11/17 0820 07/12/17 0347  NA 144 144 143  K 5.0 4.8 4.5  CL 110 111 111  CO2 GLUCOSE 119* 97 110*  BUN 31* 29* 37*  CREATININE 1.56* 1.39* 1.43*  CALCIUM 9.6 9.2 9.0  GFRNONAA 29* 34* 33*  GFRAA 34* 39* 38*    ANIONGAP Hematology Recent Labs  Lab 07/11/17 0028 07/11/17 0820 07/12/17 0347  WBC 11.3* 7.9 8.7  RBC 3.72* 3.51* 3.46*  HGB 10.8* 10.0* 9.9*  HCT 34.2* 32.0* 32.2*  MCV 91.9 91.2 93.1  MCH 29.0 28.5 28.6  MCHC 31.6 31.3 30.7  RDW 13.3 13.4 13.6  PLT 242 235 230    Cardiac EnzymesNo results for input(s): TROPONINI in the last 168 hours. No results for input(s): TROPIPOC in the last 168 hours.   BNP Recent Labs  Lab 07/11/17 0028  BNP 2,333.8*     DDimer No results for input(s): DDIMER in the last 168 hours.   Radiology    No results found.  Cardiac Studies   Echo is done, pending read  Patient Profile     84 y.o. female with a history of HTN, vertigo, AAA,who [facility] with presyncopeand is found to have complete heart block.  Her home Toprol was held, planned for likely PPM need today  Assessment & Plan    1. CHB     Remains in  CHB this AM, now > 24 hours off her metoprolol     Echo is done, pending read     Planned for PPM implant today  The patient confirms discussion yesterday with Dr. Ladona Ridgel, plans for PPM, indication, risks/benefits of procedure.  She has no follow up questions.  2. HTN     Can resume BB post pacer   3. AAA Reportedly measured at 4.6 cm at outside facility. Has pending outpatient vascular appointment.  4. Elevated Creat     Possibly 2/2 bradyc, waxing/waning here     F/u tomorrow   For questions or updates, please contact [facility] Please consult [email or web address] for contact info under Cardiology/STEMI.      Signed, Sheilah Pigeon, PA-C  07/12/2017, 8:45 AM    EP Attending  Patient seen and examined. Agree with above. The patient's CHB persists. We will plan to proceed with DDD PM insertion.  [name]

## 2017-07-12 NOTE — Progress Notes (Signed)
On call MD notified and updated regarding patients elevated BP. SBP goal noted to be less than 140 with patient maintaining 150s-160s. Orders received and carried out.

## 2017-07-12 NOTE — H&P (View-Only) (Signed)
 Progress Note  Patient Name: Deborah Moses Date of Encounter: 07/12/2017  Primary Cardiologist: Dr. Belford ([place])  Subjective   No dizziness, weakness at rest, no CP, palpitations or SOB here  Inpatient Medications    Scheduled Meds: . aspirin EC  81 mg Oral Daily  . calcium-vitamin D  1 tablet Oral Q breakfast  . chlorhexidine   Topical STAT  . gentamicin irrigation  80 mg Irrigation On Call  . heparin  5,000 Units Subcutaneous Q8H  . lisinopril  10 mg Oral Daily  . omega-3 acid ethyl esters  1 g Oral Daily  . pravastatin  40 mg Oral q1800   Continuous Infusions: . sodium chloride 50 mL/hr at 07/12/17 0640  .  ceFAZolin (ANCEF) IV     PRN Meds: acetaminophen, hydrALAZINE, nitroGLYCERIN, ondansetron (ZOFRAN) IV   Vital Signs    Vitals:   07/12/17 0320 07/12/17 0400 07/12/17 0426 07/12/17 0630  BP:   (!) 156/50 (!) 154/52  Pulse: (!) 29  (!) 30 (!) 29  Resp: 17  (!) 21 (!) 22  Temp:  97.9 F (36.6 C)    TempSrc:  Oral    SpO2: 93%  96% 96%  Weight:      Height:        Intake/Output Summary (Last 24 hours) at 07/12/2017 0845 Last data filed at 07/12/2017 0600 Gross per 24 hour  Intake 60 ml  Output 400 ml  Net -340 ml   Filed Weights   07/11/17 0000  Weight: 124 lb 12.5 oz (56.6 kg)    Telemetry    CHB, 30's, narrow QRS, occ PVCs - Personally Reviewed  ECG    No new EKGs - Personally Reviewed  Physical Exam   GEN: No acute distress, frail/thin body habitus  Neck: No JVD Cardiac: RRR, bradycardic, no murmurs, rubs, or gallops.  Respiratory: CTA b/l. GI: Soft, nontender, non-distended  MS: No edema; No deformity. Neuro:  Nonfocal  Psych: Normal affect   Labs    Chemistry Recent Labs  Lab 07/11/17 0028 07/11/17 0820 07/12/17 0347  NA 144 144 143  K 5.0 4.8 4.5  CL 110 111 111  CO2 24 25 24  GLUCOSE 119* 97 110*  BUN 31* 29* 37*  CREATININE 1.56* 1.39* 1.43*  CALCIUM 9.6 9.2 9.0  GFRNONAA 29* 34* 33*  GFRAA 34* 39* 38*    ANIONGAP 10 8 8     Hematology Recent Labs  Lab 07/11/17 0028 07/11/17 0820 07/12/17 0347  WBC 11.3* 7.9 8.7  RBC 3.72* 3.51* 3.46*  HGB 10.8* 10.0* 9.9*  HCT 34.2* 32.0* 32.2*  MCV 91.9 91.2 93.1  MCH 29.0 28.5 28.6  MCHC 31.6 31.3 30.7  RDW 13.3 13.4 13.6  PLT 242 235 230    Cardiac EnzymesNo results for input(s): TROPONINI in the last 168 hours. No results for input(s): TROPIPOC in the last 168 hours.   BNP Recent Labs  Lab 07/11/17 0028  BNP 2,333.8*     DDimer No results for input(s): DDIMER in the last 168 hours.   Radiology    No results found.  Cardiac Studies   Echo is done, pending read  Patient Profile     84 y.o. female with a history of HTN, vertigo, AAA,who presented to [facility] with presyncopeand is found to have complete heart block.  Her home Toprol was held, planned for likely PPM need today  Assessment & Plan    1. CHB     Remains in   CHB this AM, now > 24 hours off her metoprolol     Echo is done, pending read     Planned for PPM implant today  The patient confirms discussion yesterday with Dr. Ladona Ridgel, plans for PPM, indication, risks/benefits of procedure.  She has no follow up questions.  2. HTN     Can resume BB post pacer   3. AAA Reportedly measured at 4.6 cm at outside facility. Has pending outpatient vascular appointment.  4. Elevated Creat     Possibly 2/2 bradyc, waxing/waning here     F/u tomorrow   For questions or updates, please contact [facility] Please consult [email or web address] for contact info under Cardiology/STEMI.      Signed, Sheilah Pigeon, PA-C  07/12/2017, 8:45 AM    EP Attending  Patient seen and examined. Agree with above. The patient's CHB persists. We will plan to proceed with DDD PM insertion.  [name]

## 2017-07-13 ENCOUNTER — Inpatient Hospital Stay (HOSPITAL_COMMUNITY): Payer: Medicare Other

## 2017-07-13 LAB — BASIC METABOLIC PANEL
Anion gap: 8 (ref 5–15)
BUN: 29 mg/dL — AB (ref 6–20)
CHLORIDE: 107 mmol/L (ref 101–111)
CO2: 25 mmol/L (ref 22–32)
Calcium: 8.9 mg/dL (ref 8.9–10.3)
Creatinine, Ser: 1.13 mg/dL — ABNORMAL HIGH (ref 0.44–1.00)
GFR calc Af Amer: 50 mL/min — ABNORMAL LOW (ref >60)
GFR calc non Af Amer: 43 mL/min — ABNORMAL LOW (ref >60)
GLUCOSE: 104 mg/dL — AB (ref 65–99)
POTASSIUM: 3.9 mmol/L (ref 3.5–5.1)
Sodium: 140 mmol/L (ref 135–145)

## 2017-07-13 LAB — CBC
HEMATOCRIT: 29.7 % — AB (ref 36.0–46.0)
Hemoglobin: 9.3 g/dL — ABNORMAL LOW (ref 12.0–15.0)
MCH: 28.8 pg (ref 26.0–34.0)
MCHC: 31.3 g/dL (ref 30.0–36.0)
MCV: 92 fL (ref 78.0–100.0)
Platelets: 224 10*3/uL (ref 150–400)
RBC: 3.23 MIL/uL — ABNORMAL LOW (ref 3.87–5.11)
RDW: 13.5 % (ref 11.5–15.5)
WBC: 9.4 10*3/uL (ref 4.0–10.5)

## 2017-07-13 MED ORDER — APIXABAN 2.5 MG PO TABS: 2.5000 mg | ORAL_TABLET | Freq: Two times a day (BID) | ORAL | 6 refills | Status: DC

## 2017-07-13 MED FILL — Cefazolin Sodium-Dextrose IV Solution 2 GM/100ML-4%: INTRAVENOUS | Qty: 100 | Status: AC

## 2017-07-13 NOTE — Care Management Note (Addendum)
Case Management Note  Patient Details  Name: Marsela Kuan MRN: [id number] Date of Birth: 02/19/1931  Subjective/Objective:    Pt admitted with CHB and new onset A fib              Action/Plan:  PTA independent from home.  Pt has PCP.  CM provided Eliquis  free 30day card - pt informed CM that she does not have prescription coverage.  Pts grand daughter contacted drug company at bedside and provided all pertinent info.  CM also spoke with drug company; information will be mailed out today to pt and pt instructed to complete information and send back in promptly - assistance will be determined once information is received and processed.  Per drug company; this can occur within the free 30 day inventory.  CM also spoke with EP PA  and informed of barrier - EP department will follow up with pt next week to ensure process in complete - if not they will work with pt to either assist with pt getting medication or switch medication to more affordable choice.  Pt instructed if assistance is not approved or affordable to promptly call EP office.  Pt only wants to get her prescription filled at [facility] in [place] - CM confirmed that pharmacy can fill prescription today - also verified that pharmacy will take free 30day card  Expected Discharge Date:  07/13/17               Expected Discharge Plan:  Home/Self Care  In-House Referral:     Discharge planning Services  CM Consult  Post Acute Care Choice:    Choice offered to:     DME Arranged:    DME Agency:     HH Arranged:    HH Agency:     Status of Service:  In process, will continue to follow  If discussed at Long Length of Stay Meetings, dates discussed:    Additional Comments:  Cherylann Parr, RN 07/13/2017, 12:14 PM

## 2017-07-13 NOTE — Discharge Instructions (Signed)
° ° °  Supplemental Discharge Instructions for  Pacemaker/Defibrillator Patients  Activity No heavy lifting or vigorous activity with your left/right arm for 6 to 8 weeks.  Do not raise your left/right arm above your head for one week.  Gradually raise your affected arm as drawn below.             07/16/17                       07/17/17                      07/18/17                     07/19/17 __  NO DRIVING (patient does not drive)  WOUND CARE - Keep the wound area clean and dry.  Do not get this area wet for one week. No showers for one week; you may shower on  07/19/17  . - The tape/steri-strips on your wound will fall off; do not pull them off.  No bandage is needed on the site.  DO  NOT apply any creams, oils, or ointments to the wound area. - If you notice any drainage or discharge from the wound, any swelling or bruising at the site, or you develop a fever > 101? F after you are discharged home, call the office at once.  Special Instructions - You are still able to use cellular telephones; use the ear opposite the side where you have your pacemaker/defibrillator.  Avoid carrying your cellular phone near your device. - When traveling through airports, show security personnel your identification card to avoid being screened in the metal detectors.  Ask the security personnel to use the hand wand. - Avoid arc welding equipment, MRI testing (magnetic resonance imaging), TENS units (transcutaneous nerve stimulators).  Call the office for questions about other devices. - Avoid electrical appliances that are in poor condition or are not properly grounded. - Microwave ovens are safe to be near or to operate.

## 2017-07-13 NOTE — Discharge Summary (Addendum)
ELECTROPHYSIOLOGY PROCEDURE DISCHARGE SUMMARY    Patient ID: Deborah Moses,  MRN: [id number], DOB/AGE: April 29, 1931 84 y.o.  Admit date: 07/10/2017 Discharge date: 07/13/2017  Primary Care Physician: Toma Deiters, MD Primary Cardiologist: Dr. Titus Mould Electrophysiologist: new, Dr. Ladona Ridgel  Primary Discharge Diagnosis:  1. CHB 2. New AFib     CHA2DS2Vasc is 4  Secondary Discharge Diagnosis:  1. HTN 2. AAA (known)     Has out patient f/u   No Known Allergies   Procedures This Admission:  1.  Implantation of a MDT dual chamber PPM on 07/12/17 by Dr Ladona Ridgel.  The patient received a Medtronic (serial number [id number]) pacemaker, Medtronic (serial number [id number]) right atrial lead and a Medtronic (serial number [id number]) right ventricular lead (HIS) There were no immediate post procedure complications. 2.  CXR on 07/13/17 demonstrated no pneumothorax status post device implantation.   Brief HPI: Deborah Moses is a 84 y.o. female was admitted to [facility] with episodes of near syncope, weakness, dizziness, found in CHB, admitted to ICU, planned for [facility] implant  Hospital Course:  The patient was admitted and monitored closely in the ICU setting, BP remaind stable, and she remained asymptomatic asymptomatic at rest.  Echo was done noted LVEF 60-65%,  No WMA.  She  underwent implantation of a PPM with details as outlined above.  She was monitored on telemetry overnight which demonstrated new onset AFib, w/V paced rhythm.  Left chest was without hematoma or ecchymosis.  The device was interrogated and found to be functioning normally.  CXR was obtained and demonstrated no pneumothorax status post device implantation, lungs sound clear, no c/o SOB, encouraged ambulation to ability and OOB.  Wound care, arm mobility, and restrictions were reviewed with the patient.  We will stop her ASA and start Eliquis 2.5mg  BID to start in 3 days given new implant site.  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  The patient denies any CP or SOB, was examined by Dr. Ladona Ridgel and considered stable for discharge to home.   Case management provided 30 day card to patient for Eliquis, and given the patient paperwork from drug company to fill out for financial assistance.  I have reached out and discussed the case with our prior auth/assistance staff to follow up with the patient in the next next week or 2 to assist if needed with the process.  I have instructed to patient to follow up as well when at her wound check appointment on this.   Physical Exam: Vitals:   07/13/17 0800 07/13/17 0900 07/13/17 1000 07/13/17 1100  BP: (!) 122/59  (!) 130/53   Pulse: 71 (!) 58 60 60  Resp: 15 20 (!) 22 18  Temp: 98 F (36.7 C)     TempSrc: Oral     SpO2: 91% 91% 92% 90%  Weight:      Height:        GEN- The patient is well appearing, alert and oriented x 3 today.   HEENT: normocephalic, atraumatic; sclera clear, conjunctiva pink; hearing intact; oropharynx clear; neck supple, no JVP Lungs- CTA b/l, normal work of breathing.  No wheezes, rales, rhonchi Heart- RRR, no murmurs, rubs or gallops, PMI not laterally displaced GI- soft, non-tender, non-distended Extremities- no clubbing, cyanosis, or edema MS- no significant deformity or atrophy Skin- warm and dry, no rash or lesion, left chest without  hematoma/ecchymosis Psych- euthymic mood, full affect Neuro- no gross deficits   Labs:   Lab Results  Component Value Date   WBC 9.4 07/13/2017   HGB 9.3 (L) 07/13/2017   HCT 29.7 (L) 07/13/2017   MCV 92.0 07/13/2017   PLT 224 07/13/2017    Recent Labs  Lab 07/13/17 0248  NA 140  K 3.9  CL 107  CO2 25  BUN 29*  CREATININE 1.13*  CALCIUM 8.9  GLUCOSE 104*    Discharge Medications:  Allergies as of 07/13/2017   No Known Allergies     Medication List    STOP taking these medications     aspirin 81 MG chewable tablet     TAKE these medications   alendronate 70 MG tablet Commonly known as:  FOSAMAX Take 70 mg by mouth once a week. Take with a full glass of water on an empty stomach.   amLODipine 5 MG tablet Commonly known as:  NORVASC Take 5 mg by mouth daily.   apixaban 2.5 MG Tabs tablet Commonly known as:  ELIQUIS Take 1 tablet (2.5 mg total) by mouth 2 (two) times daily. Notes to patient:  Do not start until 07/16/17   calcium-vitamin D 500-200 MG-UNIT Tabs tablet Commonly known as:  OSCAL WITH D Take by mouth.   Fish Oil 1000 MG Cpdr Take 1,000 mg by mouth daily.   lisinopril 20 MG tablet Commonly known as:  PRINIVIL,ZESTRIL Take 40 mg by mouth daily.   lovastatin 20 MG tablet Commonly known as:  MEVACOR Take 20 mg by mouth at bedtime.   metoprolol succinate 25 MG 24 hr tablet Commonly known as:  TOPROL-XL Take 25 mg by mouth daily.       Disposition:  Home Discharge Instructions    Diet - low sodium heart healthy   Complete by:  As directed    Increase activity slowly   Complete by:  As directed      Follow-up Information    [facility] Office Follow up on 07/25/2017.   Specialty:  Cardiology Why:  3:30PM, wound check visit Contact information: [address], [address] [place] [address] [phone]       Marinus Maw, MD .   Specialty:  Cardiology Contact information: [address] [place] [address] [phone]           Duration of Discharge Encounter: Greater than 30 minutes including physician time.  Norma Fredrickson, PA-C 07/13/2017 11:39 AM   EP Attending  Patient seen and examined. Agree with above. Patient is doing well, s/p PPM insertion. PPM interogation under my direction demonstrates normal device function. Will dc home with usual followup.  [name]

## 2017-07-13 NOTE — Plan of Care (Signed)
  Problem: Education: Goal: Knowledge of General Education information will improve Outcome: Adequate for Discharge   Problem: Health Behavior/Discharge Planning: Goal: Ability to manage health-related needs will improve Outcome: Adequate for Discharge   Problem: Clinical Measurements: Goal: Ability to maintain clinical measurements within normal limits will improve Outcome: Adequate for Discharge Goal: Will remain free from infection Outcome: Adequate for Discharge Goal: Diagnostic test results will improve Outcome: Adequate for Discharge Goal: Respiratory complications will improve Outcome: Adequate for Discharge Goal: Cardiovascular complication will be avoided Outcome: Adequate for Discharge   Problem: Activity: Goal: Risk for activity intolerance will decrease Outcome: Adequate for Discharge   Problem: Nutrition: Goal: Adequate nutrition will be maintained Outcome: Adequate for Discharge   Problem: Coping: Goal: Level of anxiety will decrease Outcome: Adequate for Discharge   Problem: Elimination: Goal: Will not experience complications related to bowel motility Outcome: Adequate for Discharge Goal: Will not experience complications related to urinary retention Outcome: Adequate for Discharge   Problem: Pain Managment: Goal: General experience of comfort will improve Outcome: Adequate for Discharge   Problem: Safety: Goal: Ability to remain free from injury will improve Outcome: Adequate for Discharge   Problem: Education: Goal: Ability to manage disease process will improve Outcome: Adequate for Discharge   Problem: Skin Integrity: Goal: Risk for impaired skin integrity will decrease Outcome: Adequate for Discharge   Problem: Cardiac: Goal: Ability to achieve and maintain adequate cardiopulmonary perfusion will improve Outcome: Adequate for Discharge   Problem: Education: Goal: Knowledge of cardiac device and self-care will improve Outcome: Adequate  for Discharge Goal: Ability to safely manage health related needs after discharge will improve Outcome: Adequate for Discharge   Problem: Cardiac: Goal: Ability to achieve and maintain adequate cardiopulmonary perfusion will improve Outcome: Adequate for Discharge

## 2017-07-13 NOTE — Progress Notes (Signed)
CSW spoke with RN about consult regarding patient's financial concerns. Per RN, patient has concerns about how she can receive help with her hospital bill. That isn't something that CSW can help with; patient will need to reach out to hospital billing after discharge to determine if she would qualify for any assistance programs. CSW relayed information to RN, RN said she would pass info along to the patient.  Please consult if CSW need arises. CSW signing off.  Blenda Nicely, [place] Clinical Social Worker [phone]

## 2017-07-18 ENCOUNTER — Encounter: Payer: Self-pay | Admitting: Surgery

## 2017-07-18 ENCOUNTER — Ambulatory Visit: Payer: Medicare Other | Admitting: Surgery

## 2017-07-18 VITALS — BP 141/71 | HR 65 | Temp 97.3°F | Resp 17 | Ht 62.0 in | Wt 120.9 lb

## 2017-07-18 DIAGNOSIS — I714 Abdominal aortic aneurysm, without rupture, unspecified: Secondary | ICD-10-CM

## 2017-07-18 NOTE — Progress Notes (Signed)
Vascular and Vein Specialist of [facility]  Patient name: Deborah Moses MRN: [id number] DOB: [birth year] Sex: female   REQUESTING PROVIDER:    Dr. Olena Leatherwood   REASON FOR CONSULT:    AAA  HISTORY OF PRESENT ILLNESS:   Deborah Moses is a 84 y.o. female, who is referred today for evaluation of an abdominal aortic aneurysm.  She has a known history of an abdominal aortic aneurysm however this is now increased to 4.6 cm on recent ultrasound.  She denies any abdominal pain.  The patient recently had a pacemaker placed for symptomatic bradycardia and third-degree AV block she is a non-smoker..  She is medically managed for hypertension.  She does suffer from arthritis.  PAST MEDICAL HISTORY    Past Medical History:  Diagnosis Date  . AAA (abdominal aortic aneurysm) (HCC)   . Heart block AV third degree (HCC)   . HTN (hypertension)   . Vertigo      FAMILY HISTORY   Family History  Problem Relation Age of Onset  . Heart failure Father     SOCIAL HISTORY:   Social History   Socioeconomic History  . Marital status: Widowed    Spouse name: Not on file  . Number of children: 4  . Years of education: Not on file  . Highest education level: Not on file  Occupational History  . Not on file  Social Needs  . Financial resource strain: Not on file  . Food insecurity:    Worry: Not on file    Inability: Not on file  . Transportation needs:    Medical: Not on file    Non-medical: Not on file  Tobacco Use  . Smoking status: Never Smoker  . Smokeless tobacco: Never Used  Substance and Sexual Activity  . Alcohol use: Never    Frequency: Never  . Drug use: Not on file  . Sexual activity: Not on file  Lifestyle  . Physical activity:    Days per week: Not on file    Minutes per session: Not on file  . Stress: Not on file  Relationships  . Social connections:    Talks on phone: Not on file    Gets together: Not on file    Attends  religious service: Not on file    Active member of club or organization: Not on file    Attends meetings of clubs or organizations: Not on file    Relationship status: Not on file  . Intimate partner violence:    Fear of current or ex partner: Not on file    Emotionally abused: Not on file    Physically abused: Not on file    Forced sexual activity: Not on file  Other Topics Concern  . Not on file  Social History Narrative  . Not on file    ALLERGIES:    No Known Allergies  CURRENT MEDICATIONS:    Current Outpatient Medications  Medication Sig Dispense Refill  . acetaminophen (TYLENOL) 500 MG tablet Take 500 mg by mouth every 6 (six) hours as needed.    Marland Kitchen alendronate (FOSAMAX) 70 MG tablet Take 70 mg by mouth once a week. Take with a full glass of water on an empty stomach.    Marland Kitchen amLODipine (NORVASC) 5 MG tablet Take 5 mg by mouth daily.    Marland Kitchen apixaban (ELIQUIS) 2.5 MG TABS tablet Take 1 tablet (2.5 mg total) by mouth 2 (two) times daily. 60 tablet 6  . calcium-vitamin D (OSCAL WITH  D) 500-200 MG-UNIT TABS tablet Take by mouth.    Marland Kitchen. lisinopril (PRINIVIL,ZESTRIL) 20 MG tablet Take 40 mg by mouth daily.     Marland Kitchen. lovastatin (MEVACOR) 20 MG tablet Take 20 mg by mouth at bedtime.    . metoprolol succinate (TOPROL-XL) 25 MG 24 hr tablet Take 25 mg by mouth daily.    . Omega-3 Fatty Acids (FISH OIL) 1000 MG CPDR Take 1,000 mg by mouth daily.      No current facility-administered medications for this visit.     REVIEW OF SYSTEMS:   [X]  denotes positive finding, [ ]  denotes negative finding Cardiac  Comments:  Chest pain or chest pressure:    Shortness of breath upon exertion:    Short of breath when lying flat:    Irregular heart rhythm:        Vascular    Pain in calf, thigh, or hip brought on by ambulation:    Pain in feet at night that wakes you up from your sleep:     Blood clot in your veins:    Leg swelling:         Pulmonary    Oxygen at home:    Productive cough:       Wheezing:         Neurologic    Sudden weakness in arms or legs:     Sudden numbness in arms or legs:     Sudden onset of difficulty speaking or slurred speech:    Temporary loss of vision in one eye:     Problems with dizziness:  x       Gastrointestinal    Blood in stool:      Vomited blood:         Genitourinary    Burning when urinating:     Blood in urine:        Psychiatric    Major depression:         Hematologic    Bleeding problems:    Problems with blood clotting too easily:        Skin    Rashes or ulcers:        Constitutional    Fever or chills:     PHYSICAL EXAM:   Vitals:   07/18/17 0921 07/18/17 0924  BP: (!) 150/74 (!) 141/71  Pulse: 65   Resp: 17   Temp: (!) 97.3 F (36.3 C)   TempSrc: Oral   SpO2: 96%   Weight: 120 lb 14.4 oz (54.8 kg)   Height: 5\' 2"  (1.575 m)     GENERAL: The patient is a well-nourished female, in no acute distress. The vital signs are documented above. CARDIAC: There is a regular rate and rhythm.  VASCULAR: Palpable left dorsalis pedis pulse, nonpalpable right.  No carotid bruits. PULMONARY: Nonlabored respirations ABDOMEN: Soft and non-tender.  Pulsatile mass MUSCULOSKELETAL: There are no major deformities or cyanosis. NEUROLOGIC: No focal weakness or paresthesias are detected. SKIN: There are no ulcers or rashes noted. PSYCHIATRIC: The patient has a normal affect.  STUDIES:   I have reviewed her ultrasound which is a 4.6 cm infrarenal abdominal aortic aneurysm.  ASSESSMENT and PLAN   AAA: I have recommended getting a CT angiogram in 6 months to determine the true diameter of the aneurysm and also to determine if she is a candidate for percutaneous repair.  We discussed the possibility of rupture.  All other questions were answered today.   Durene CalWells Emmert Roethler, MD Vascular and Vein  Specialists of [facility] [phone] Pager [phone]

## 2017-07-19 ENCOUNTER — Telehealth: Payer: Self-pay

## 2017-07-19 ENCOUNTER — Encounter: Payer: Self-pay | Admitting: Internal Medicine

## 2017-07-19 NOTE — Telephone Encounter (Signed)
**Note De-Identified Maximilian Tallo Obfuscation** I called the pt and she stated that her daughter has the Eliquis pt assistance application and is going to fill it out. I have advised the pt that the sooner we get her application back the better as we want to be sure that she has her medications as it is very important that she not miss any doses.  I have advised her that if she gets down to 10 tablets (5 days worth of Eliquis) to call the office to let us know and we will give her samples if available.  She did write our office phone number down and verbalized understanding.

## 2017-07-19 NOTE — Telephone Encounter (Addendum)
**Note De-identified Adalida Garver Obfuscation** -----  **Note De-Identified Lesleyann Fichter Obfuscation** Message from Lorelle FormosaPatricia M Cheron Pasquarelli, LPN sent at 4/09/81195/29/2019  1:15 PM EDT ----- Regarding: Eliquis Per Francis Dowseenee Ursuy, PA-c this pt was d/ced from hosp on 07/13/17 with pt asst application for Eliquis and a 30 day card to use at her pharmacy. Per Luster Landsbergenee she has no medication coverage. Luster LandsbergRenee has asked me to call the pt next week to see if she has completed her Eliquis pt assistance application.

## 2017-07-25 ENCOUNTER — Encounter (INDEPENDENT_AMBULATORY_CARE_PROVIDER_SITE_OTHER): Payer: Self-pay

## 2017-07-25 ENCOUNTER — Ambulatory Visit (INDEPENDENT_AMBULATORY_CARE_PROVIDER_SITE_OTHER): Payer: Medicare Other | Admitting: *Deleted

## 2017-07-25 ENCOUNTER — Ambulatory Visit: Payer: Medicare Other

## 2017-07-25 DIAGNOSIS — I442 Atrioventricular block, complete: Secondary | ICD-10-CM | POA: Diagnosis not present

## 2017-07-25 LAB — CUP PACEART INCLINIC DEVICE CHECK
Battery Remaining Longevity: 59 mo
Battery Voltage: 3.04 V
Brady Statistic AS VS Percent: 7.51 %
Implantable Lead Implant Date: 20190528
Implantable Lead Implant Date: 20190528
Implantable Lead Location: 753860
Implantable Lead Model: 5076
Implantable Pulse Generator Implant Date: 20190528
Lead Channel Impedance Value: 190 Ohm
Lead Channel Impedance Value: 570 Ohm
Lead Channel Pacing Threshold Amplitude: 0.5 V
Lead Channel Sensing Intrinsic Amplitude: 0.25 mV
Lead Channel Setting Pacing Pulse Width: 1 ms
MDC IDC LEAD LOCATION: 753859
MDC IDC MSMT LEADCHNL RA IMPEDANCE VALUE: 266 Ohm
MDC IDC MSMT LEADCHNL RA SENSING INTR AMPL: 0.5 mV
MDC IDC MSMT LEADCHNL RV IMPEDANCE VALUE: 437 Ohm
MDC IDC MSMT LEADCHNL RV PACING THRESHOLD PULSEWIDTH: 1 ms
MDC IDC MSMT LEADCHNL RV SENSING INTR AMPL: 16.75 mV
MDC IDC MSMT LEADCHNL RV SENSING INTR AMPL: 19.875 mV
MDC IDC SESS DTM: 20190610121039
MDC IDC SET LEADCHNL RA PACING AMPLITUDE: 5 V
MDC IDC SET LEADCHNL RV PACING AMPLITUDE: 3.5 V
MDC IDC SET LEADCHNL RV SENSING SENSITIVITY: 1.2 mV
MDC IDC STAT BRADY AP VP PERCENT: 25.75 %
MDC IDC STAT BRADY AP VS PERCENT: 0.17 %
MDC IDC STAT BRADY AS VP PERCENT: 66.49 %
MDC IDC STAT BRADY RA PERCENT PACED: 21.73 %
MDC IDC STAT BRADY RV PERCENT PACED: 92.13 %

## 2017-07-25 NOTE — Progress Notes (Signed)
Wound check appointment. Steri-strips removed. Wound without redness or edema. Incision edges approximated, wound well healed. Normal device function. Thresholds, sensing, and impedances consistent with implant measurements. RA unipolar impedance alert- trends stable, lowest impedance alert parameter already programmed. HIS lead tested with 12 lead EKG, septal capture noted from 5V @ 1ms down to LOC at 0.25V @ 1ms. Device programmed at 3.5V @ 1 in RV (HIS) and autocap in RA for extra safety margin until 3 month visit. Histogram distribution appropriate for patient and level of activity. 38% AT/AF- Eliquis. No high ventricular rates noted. Patient educated about wound care, arm mobility, lifting restrictions and Carelink monitoring. ROV with Dr. Ladona Ridgelaylor in [facility] 10/27/17.

## 2017-07-26 ENCOUNTER — Telehealth: Payer: Self-pay | Admitting: Cardiology

## 2017-07-26 NOTE — Telephone Encounter (Signed)
Pt is in a fib > 6 hours Device unipolar but supposed to be bi polar?

## 2017-07-29 ENCOUNTER — Other Ambulatory Visit: Payer: Self-pay

## 2017-07-29 ENCOUNTER — Other Ambulatory Visit: Payer: Self-pay | Admitting: Surgery

## 2017-07-29 DIAGNOSIS — I714 Abdominal aortic aneurysm, without rupture, unspecified: Secondary | ICD-10-CM

## 2017-08-01 ENCOUNTER — Other Ambulatory Visit: Payer: Self-pay

## 2017-08-01 DIAGNOSIS — Z6821 Body mass index (BMI) 21.0-21.9, adult: Secondary | ICD-10-CM | POA: Diagnosis not present

## 2017-08-01 DIAGNOSIS — M545 Low back pain: Secondary | ICD-10-CM | POA: Diagnosis not present

## 2017-08-01 DIAGNOSIS — I10 Essential (primary) hypertension: Secondary | ICD-10-CM | POA: Diagnosis not present

## 2017-08-01 MED ORDER — APIXABAN 2.5 MG PO TABS: 2.5000 mg | ORAL_TABLET | Freq: Two times a day (BID) | ORAL | 3 refills | Status: DC

## 2017-08-01 NOTE — Telephone Encounter (Signed)
The pt left the provider part of her BMS pt [facility]st [facility] with a request to fax to this office to be completed. I have completed the providers part of the application, printed an Eliquis RX and placed both in Dr Koren BoundKleins Mail bin awaiting his signature.

## 2017-08-02 ENCOUNTER — Telehealth: Payer: Self-pay | Admitting: *Deleted

## 2017-08-02 NOTE — Telephone Encounter (Signed)
Received telephone from [employer]ynn Via at [address] [facility]. Patient needs samples of Eliquis 2.5 mg. .States that they have no transportation [place] go [place]. Wanted to know if our office could supply samples to patient.  Please call patient at home.

## 2017-08-02 NOTE — Telephone Encounter (Signed)
Samples ready for pick up

## 2017-08-09 NOTE — Telephone Encounter (Signed)
Dr Graciela HusbandsKlein has signed the pts BMS Pt asst application and Eliquis RX.  The pt only sent the provider part of the application to us and not the pt part that she filled out.  I called to ask her if she wanted to bring me her part and I will fax it all to BMS together. She requested that I mail the provider part along with the signed RX for Eliquis to her and she states that she will mail into BMS herself.  I have placed the application and Eliquis RX in out out going mail bin.

## 2017-08-25 NOTE — Telephone Encounter (Signed)
I called patient because we received duplicate [employer] medication assistance forms with help with ELIQUIS. She states she mailed those forms about a week ago, I did call BMS and confirmed they received forms. BMS did have a question for us and now they are going to process the application. I called patient back and gave her this information.

## 2017-08-30 NOTE — Telephone Encounter (Signed)
Letter received via fax from BMS stating that they have approved the pt for pt asst with Eliquis. Approval good until 02/14/2018.  Application Case# [id number]

## 2017-10-14 ENCOUNTER — Ambulatory Visit: Payer: Medicare Other | Admitting: Internal Medicine

## 2017-10-27 ENCOUNTER — Encounter: Payer: Self-pay | Admitting: Internal Medicine

## 2017-10-27 ENCOUNTER — Ambulatory Visit (INDEPENDENT_AMBULATORY_CARE_PROVIDER_SITE_OTHER): Payer: Medicare Other | Admitting: Internal Medicine

## 2017-10-27 VITALS — BP 140/82 | HR 73 | Ht 62.0 in | Wt 112.2 lb

## 2017-10-27 DIAGNOSIS — I442 Atrioventricular block, complete: Secondary | ICD-10-CM | POA: Diagnosis not present

## 2017-10-27 DIAGNOSIS — I495 Sick sinus syndrome: Secondary | ICD-10-CM

## 2017-10-27 NOTE — Progress Notes (Signed)
HPI Deborah Moses returns today for followup of her PPM. She is a pleasant 84 yo woman with peripheral vascular disease and a AAA, sinus node dysfunction, s/p PPM insertion, and PAF. She has not had palpitations. She was found to have a AAA and a CT scan was recommended but she decided not to have this done. She denies abdominal pain. No chest pain or sob. No edema. No Known Allergies   Current Outpatient Medications  Medication Sig Dispense Refill  . acetaminophen (TYLENOL) 500 MG tablet Take 500 mg by mouth every 6 (six) hours as needed.    Marland Kitchen. alendronate (FOSAMAX) 70 MG tablet Take 70 mg by mouth once a week. Take with a full glass of water on an empty stomach.    Marland Kitchen. amLODipine (NORVASC) 5 MG tablet Take 5 mg by mouth daily.    Marland Kitchen. apixaban (ELIQUIS) 2.5 MG TABS tablet Take 1 tablet (2.5 mg total) by mouth 2 (two) times daily. 180 tablet 3  . calcium-vitamin D (OSCAL WITH D) 500-200 MG-UNIT TABS tablet Take by mouth.    Marland Kitchen. lisinopril (PRINIVIL,ZESTRIL) 20 MG tablet Take 40 mg by mouth daily.     Marland Kitchen. lovastatin (MEVACOR) 20 MG tablet Take 20 mg by mouth at bedtime.    . metoprolol succinate (TOPROL-XL) 25 MG 24 hr tablet Take 25 mg by mouth daily.    . Omega-3 Fatty Acids (FISH OIL) 1000 MG CPDR Take 1,000 mg by mouth daily.      No current facility-administered medications for this visit.      Past Medical History:  Diagnosis Date  . AAA (abdominal aortic aneurysm) (HCC)   . Heart block AV third degree (HCC)   . HTN (hypertension)   . Vertigo     ROS:   All systems reviewed and negative except as noted in the HPI.   Past Surgical History:  Procedure Laterality Date  . PACEMAKER IMPLANT N/A 07/12/2017   Procedure: PACEMAKER IMPLANT;  Surgeon: Marinus Mawaylor, Arriah Wadle W, MD;  [facility];  Service: Cardiovascular;  Laterality: N/A;     Family History  Problem Relation Age of Onset  . Heart failure Father      Social History   Socioeconomic History  . Marital  status: Widowed    Spouse name: Not on file  . Number of children: 4  . Years of education: Not on file  . Highest education level: Not on file  Occupational History  . Not on file  Social Needs  . Financial resource strain: Not on file  . Food insecurity:    Worry: Not on file    Inability: Not on file  . Transportation needs:    Medical: Not on file    Non-medical: Not on file  Tobacco Use  . Smoking status: Never Smoker  . Smokeless tobacco: Never Used  Substance and Sexual Activity  . Alcohol use: Never    Frequency: Never  . Drug use: Not on file  . Sexual activity: Not on file  Lifestyle  . Physical activity:    Days per week: Not on file    Minutes per session: Not on file  . Stress: Not on file  Relationships  . Social connections:    Talks on phone: Not on file    Gets together: Not on file    Attends religious service: Not on file    Active member of club or organization: Not on file    Attends meetings  of clubs or organizations: Not on file    Relationship status: Not on file  . Intimate partner violence:    Fear of current or ex partner: Not on file    Emotionally abused: Not on file    Physically abused: Not on file    Forced sexual activity: Not on file  Other Topics Concern  . Not on file  Social History Narrative  . Not on file     BP 140/82   Pulse 73   Ht 5\' 2"  (1.575 m)   Wt 112 lb 3.2 oz (50.9 kg)   SpO2 96%   BMI 20.52 kg/m   Physical Exam:  Well appearing elderly woman, NAD HEENT: Unremarkable Neck:  6 cm JVD, no thyromegally Lymphatics:  No adenopathy Back:  No CVA tenderness Lungs:  Clear with no wheezes HEART:  Regular rate rhythm, no murmurs, no rubs, no clicks Abd:  soft, positive bowel sounds, no organomegally, no rebound, no guarding Ext:  2 plus pulses, no edema, no cyanosis, no clubbing Skin:  No rashes no nodules Neuro:  CN II through XII intact, motor grossly intact  DEVICE  Normal device function.  See PaceArt  for details.   Assess/Plan: 1. PAF - she is maintaining nSR. She will continue her current meds. She does not have an indication for AA drug therapy at this time. 2. HTN - her blood pressure is up a bit but better at home. She will continue her current meds. 3. PPM - her atrial lead was initially questionable but is now working normally. She is programmed DDD with long AV delays to avoid ventricular pacing 4. AAA - she has decided for now not to have the CT scan. She is not having any pain. She will followup with Dr. Myra Gianotti.   Deborah Moses.D.

## 2017-10-27 NOTE — Patient Instructions (Signed)
Medication Instructions:  Your physician recommends that you continue on your current medications as directed. Please refer to the Current Medication list given to you today.   Labwork: NONE  Testing/Procedures: NONE  Follow-Up: Your physician wants you to follow-up in: 9 MONTHS.  You will receive a reminder letter in the mail two months in advance. If you don't receive a letter, please call our office to schedule the follow-up appointment.   Any Other Special Instructions Will Be Listed Below (If Applicable).     If you need a refill on your cardiac medications before your next appointment, please call your pharmacy.   

## 2017-11-14 DIAGNOSIS — I481 Persistent atrial fibrillation: Secondary | ICD-10-CM | POA: Diagnosis not present

## 2017-11-14 DIAGNOSIS — M545 Low back pain: Secondary | ICD-10-CM | POA: Diagnosis not present

## 2017-11-14 DIAGNOSIS — Z6820 Body mass index (BMI) 20.0-20.9, adult: Secondary | ICD-10-CM | POA: Diagnosis not present

## 2017-11-14 DIAGNOSIS — Z0000 Encounter for general adult medical examination without abnormal findings: Secondary | ICD-10-CM | POA: Diagnosis not present

## 2017-11-14 DIAGNOSIS — Z6821 Body mass index (BMI) 21.0-21.9, adult: Secondary | ICD-10-CM | POA: Diagnosis not present

## 2017-11-14 DIAGNOSIS — I10 Essential (primary) hypertension: Secondary | ICD-10-CM | POA: Diagnosis not present

## 2017-11-17 DIAGNOSIS — Z23 Encounter for immunization: Secondary | ICD-10-CM | POA: Diagnosis not present

## 2017-11-21 DIAGNOSIS — M85851 Other specified disorders of bone density and structure, right thigh: Secondary | ICD-10-CM | POA: Diagnosis not present

## 2017-11-21 DIAGNOSIS — M810 Age-related osteoporosis without current pathological fracture: Secondary | ICD-10-CM | POA: Diagnosis not present

## 2017-12-21 ENCOUNTER — Other Ambulatory Visit: Payer: Self-pay

## 2017-12-21 DIAGNOSIS — I714 Abdominal aortic aneurysm, without rupture, unspecified: Secondary | ICD-10-CM

## 2018-01-03 ENCOUNTER — Telehealth: Payer: Self-pay

## 2018-01-03 NOTE — Telephone Encounter (Signed)
Dr Ladona Ridgelaylor has signed the BMS pt asst application, I have put it in an envelope addressed to the pt and placed it in out outgoing mail.

## 2018-01-03 NOTE — Telephone Encounter (Signed)
The pt mailed the provider part of a [employer] pt assistance application to us with a written letter asking us to complete the form, have Dr Ladona Ridgelaylor sign and mail back to her so she can mail her application herself.  I have completed the form and placed it in Dr Lubertha Basqueaylor's mail bin awaiting his signature.

## 2018-01-16 ENCOUNTER — Ambulatory Visit (HOSPITAL_COMMUNITY): Payer: Medicare Other

## 2018-01-16 ENCOUNTER — Ambulatory Visit (HOSPITAL_COMMUNITY)
Admission: RE | Admit: 2018-01-16 | Discharge: 2018-01-16 | Disposition: A | Discharge status: Home / Self Care | Payer: Medicare Other | Source: Ambulatory Visit | Attending: Surgery | Admitting: Surgery

## 2018-01-16 ENCOUNTER — Ambulatory Visit: Payer: Medicare Other | Admitting: Surgery

## 2018-01-16 DIAGNOSIS — I714 Abdominal aortic aneurysm, without rupture, unspecified: Secondary | ICD-10-CM

## 2018-01-16 DIAGNOSIS — I716 Thoracoabdominal aortic aneurysm, without rupture: Secondary | ICD-10-CM | POA: Diagnosis not present

## 2018-01-16 LAB — POCT I-STAT CREATININE: Creatinine, Ser: 0.9 mg/dL (ref 0.44–1.00)

## 2018-01-16 MED ORDER — IOPAMIDOL (ISOVUE-370) INJECTION 76%
100.0000 mL | Freq: Once | INTRAVENOUS | Status: AC | PRN
  Administered 2018-01-16: 100 mL via INTRAVENOUS

## 2018-01-23 ENCOUNTER — Ambulatory Visit: Payer: Medicare Other | Admitting: Surgery

## 2018-01-23 ENCOUNTER — Other Ambulatory Visit: Payer: Self-pay

## 2018-01-23 ENCOUNTER — Encounter: Payer: Self-pay | Admitting: Surgery

## 2018-01-23 VITALS — BP 153/81 | HR 70 | Temp 96.8°F | Resp 14 | Ht 62.0 in | Wt 114.0 lb

## 2018-01-23 DIAGNOSIS — I712 Thoracic aortic aneurysm, without rupture, unspecified: Secondary | ICD-10-CM

## 2018-01-23 DIAGNOSIS — I714 Abdominal aortic aneurysm, without rupture, unspecified: Secondary | ICD-10-CM

## 2018-01-23 NOTE — Progress Notes (Signed)
[facility]  Patient name: Deborah Moses MRN: [id number] DOB: May 29, 1931 Sex: female   REASON FOR VISIT:    Follow up  HISOTRY OF PRESENT ILLNESS:   Deborah Moses is a 84 y.o. female, who I initially saw in June 2019 for 4.6 cm aneurysm.  She is back today for follow-up.  She denies any abdominal pain.  The patient recently had a pacemaker placed for symptomatic bradycardia and third-degree AV block she is a non-smoker..  She is medically managed for hypertension.  She does suffer from arthritis.  PAST MEDICAL HISTORY:   Past Medical History:  Diagnosis Date  . AAA (abdominal aortic aneurysm) (HCC)   . Heart block AV third degree (HCC)   . HTN (hypertension)   . Vertigo      FAMILY HISTORY:   Family History  Problem Relation Age of Onset  . Heart failure Father     SOCIAL HISTORY:   Social History   Tobacco Use  . Smoking status: Never Smoker  . Smokeless tobacco: Never Used  Substance Use Topics  . Alcohol use: Never    Frequency: Never     ALLERGIES:   No Known Allergies   CURRENT MEDICATIONS:   Current Outpatient Medications  Medication Sig Dispense Refill  . acetaminophen (TYLENOL) 500 MG tablet Take 500 mg by mouth every 6 (six) hours as needed.    Marland Kitchen. alendronate (FOSAMAX) 70 MG tablet Take 70 mg by mouth once a week. Take with a full glass of water on an empty stomach.    Marland Kitchen. amLODipine (NORVASC) 5 MG tablet Take 5 mg by mouth daily.    Marland Kitchen. apixaban (ELIQUIS) 2.5 MG TABS tablet Take 1 tablet (2.5 mg total) by mouth 2 (two) times daily. 180 tablet 3  . calcium-vitamin D (OSCAL WITH D) 500-200 MG-UNIT TABS tablet Take by mouth.    Marland Kitchen. lisinopril (PRINIVIL,ZESTRIL) 20 MG tablet Take 40 mg by mouth daily.     Marland Kitchen. lovastatin (MEVACOR) 20 MG tablet Take 20 mg by mouth at bedtime.    . metoprolol succinate (TOPROL-XL) 25 MG 24 hr tablet Take 25 mg by mouth daily.    . Omega-3 Fatty Acids (FISH  OIL) 1000 MG CPDR Take 1,000 mg by mouth daily.      No current facility-administered medications for this visit.     REVIEW OF SYSTEMS:   [X]  denotes positive finding, [ ]  denotes negative finding Cardiac  Comments:  Chest pain or chest pressure:    Shortness of breath upon exertion:    Short of breath when lying flat:    Irregular heart rhythm:        Vascular    Pain in calf, thigh, or hip brought on by ambulation:    Pain in feet at night that wakes you up from your sleep:     Blood clot in your veins:    Leg swelling:         Pulmonary    Oxygen at home:    Productive cough:     Wheezing:         Neurologic    Sudden weakness in arms or legs:     Sudden numbness in arms or legs:     Sudden onset of difficulty speaking or slurred speech:    Temporary loss of vision in one eye:     Problems with dizziness:         Gastrointestinal    Blood in stool:  Vomited blood:         Genitourinary    Burning when urinating:     Blood in urine:        Psychiatric    Major depression:         Hematologic    Bleeding problems:    Problems with blood clotting too easily:        Skin    Rashes or ulcers:        Constitutional    Fever or chills:      PHYSICAL EXAM:   Vitals:   01/23/18 0903  BP: (!) 153/81  Pulse: 70  Resp: 14  Temp: (!) 96.8 F (36 C)  TempSrc: Oral  SpO2: 98%  Weight: 114 lb (51.7 kg)  Height: 5\' 2"  (1.575 m)    GENERAL: The patient is a well-nourished female, in no acute distress. The vital signs are documented above. CARDIAC: There is a regular rate and rhythm.  VASCULAR: Extremities are warm and well-perfused PULMONARY: Non-labored respirations ABDOMEN: Soft and non-tender with normal pitched bowel sounds.  MUSCULOSKELETAL: There are no major deformities or cyanosis. NEUROLOGIC: No focal weakness or paresthesias are detected. SKIN: There are no ulcers or rashes noted. PSYCHIATRIC: The patient has a normal affect.  STUDIES:    I have reviewed her CT scan with the following findings: 1. 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 Recommend semi-annual imaging followup by CTA or MRA and referral to cardiothoracic surgery if not already obtained. This recommendation follows 2010 ACCF/AHA/AATS/ACR/ASA/SCA/SCAI/SIR/STS/SVM Guidelines for the Diagnosis and Management of Patients With Thoracic Aortic Disease. Circulation. 2010; 121: e266-e36 2. Bilateral renal artery ostial stenosis, left worse than right. Possible FMD involving anterior and posterior division branches of the right renal artery. 3. No acute thoracic or abdominal findings. 4. Descending and sigmoid diverticulosis.   MEDICAL ISSUES:   AAA: Maximum diameter was 4.3 cm.  She does appear to be a endovascular candidate.  We discussed continuing to monitor this with consideration for surgery once the size becomes greater than 5 cm  Thoracic aneurysm: This measured 4.6 cm.  I will have her return in 9 months for repeat imaging.    Durene Cal, MD [facility] [phone] Pager [phone]

## 2018-01-26 ENCOUNTER — Ambulatory Visit (INDEPENDENT_AMBULATORY_CARE_PROVIDER_SITE_OTHER): Payer: Medicare Other

## 2018-01-26 DIAGNOSIS — I442 Atrioventricular block, complete: Secondary | ICD-10-CM

## 2018-01-27 ENCOUNTER — Encounter: Payer: Self-pay | Admitting: Cardiology

## 2018-01-27 NOTE — Progress Notes (Signed)
Remote pacemaker transmission.   

## 2018-02-20 DIAGNOSIS — N182 Chronic kidney disease, stage 2 (mild): Secondary | ICD-10-CM | POA: Diagnosis not present

## 2018-02-20 DIAGNOSIS — I10 Essential (primary) hypertension: Secondary | ICD-10-CM | POA: Diagnosis not present

## 2018-02-20 DIAGNOSIS — E785 Hyperlipidemia, unspecified: Secondary | ICD-10-CM | POA: Diagnosis not present

## 2018-02-20 DIAGNOSIS — M818 Other osteoporosis without current pathological fracture: Secondary | ICD-10-CM | POA: Diagnosis not present

## 2018-03-11 LAB — CUP PACEART REMOTE DEVICE CHECK
Battery Remaining Longevity: 76 mo
Battery Voltage: 3 V
Brady Statistic AP VP Percent: 0.1 %
Brady Statistic AP VS Percent: 30.39 %
Brady Statistic AS VP Percent: 0.04 %
Brady Statistic AS VS Percent: 69.48 %
Brady Statistic RA Percent Paced: 30.85 %
Brady Statistic RV Percent Paced: 0.14 %
Date Time Interrogation Session: 20191212121311
Implantable Lead Implant Date: 20190528
Implantable Lead Implant Date: 20190528
Implantable Lead Location: 753859
Implantable Lead Model: 3830
Implantable Lead Model: 5076
Implantable Pulse Generator Implant Date: 20190528
Lead Channel Impedance Value: 361 Ohm
Lead Channel Impedance Value: 361 Ohm
Lead Channel Impedance Value: 418 Ohm
Lead Channel Impedance Value: 494 Ohm
Lead Channel Pacing Threshold Amplitude: 1.125 V
Lead Channel Pacing Threshold Amplitude: 2.5 V
Lead Channel Pacing Threshold Pulse Width: 0.4 ms
Lead Channel Pacing Threshold Pulse Width: 0.4 ms
Lead Channel Sensing Intrinsic Amplitude: 16 mV
Lead Channel Sensing Intrinsic Amplitude: 16 mV
Lead Channel Sensing Intrinsic Amplitude: 2 mV
Lead Channel Sensing Intrinsic Amplitude: 2 mV
Lead Channel Setting Pacing Amplitude: 5 V
Lead Channel Setting Pacing Pulse Width: 1 ms
Lead Channel Setting Sensing Sensitivity: 1.2 mV
MDC IDC LEAD LOCATION: 753860
MDC IDC SET LEADCHNL RV PACING AMPLITUDE: 3.5 V

## 2018-04-27 ENCOUNTER — Ambulatory Visit (INDEPENDENT_AMBULATORY_CARE_PROVIDER_SITE_OTHER): Payer: Medicare Other | Admitting: *Deleted

## 2018-04-27 DIAGNOSIS — I442 Atrioventricular block, complete: Secondary | ICD-10-CM | POA: Diagnosis not present

## 2018-04-27 DIAGNOSIS — I495 Sick sinus syndrome: Secondary | ICD-10-CM

## 2018-04-27 LAB — CUP PACEART REMOTE DEVICE CHECK
Battery Remaining Longevity: 70 mo
Battery Voltage: 2.99 V
Brady Statistic AP VP Percent: 0.19 %
Brady Statistic AP VS Percent: 39.03 %
Brady Statistic AS VP Percent: 0.16 %
Brady Statistic AS VS Percent: 60.62 %
Brady Statistic RV Percent Paced: 0.36 %
Date Time Interrogation Session: 20200312124024
Implantable Lead Implant Date: 20190528
Implantable Lead Implant Date: 20190528
Implantable Lead Location: 753859
Implantable Lead Location: 753860
Implantable Lead Model: 3830
Implantable Pulse Generator Implant Date: 20190528
Lead Channel Impedance Value: 285 Ohm
Lead Channel Impedance Value: 342 Ohm
Lead Channel Impedance Value: 361 Ohm
Lead Channel Impedance Value: 494 Ohm
Lead Channel Pacing Threshold Amplitude: 1.125 V
Lead Channel Pacing Threshold Amplitude: 2.375 V
Lead Channel Pacing Threshold Pulse Width: 0.4 ms
Lead Channel Pacing Threshold Pulse Width: 0.4 ms
Lead Channel Sensing Intrinsic Amplitude: 15.25 mV
Lead Channel Sensing Intrinsic Amplitude: 15.25 mV
Lead Channel Sensing Intrinsic Amplitude: 3 mV
Lead Channel Sensing Intrinsic Amplitude: 3 mV
Lead Channel Setting Pacing Amplitude: 3.5 V
Lead Channel Setting Pacing Amplitude: 5 V
Lead Channel Setting Sensing Sensitivity: 1.2 mV
MDC IDC SET LEADCHNL RV PACING PULSEWIDTH: 1 ms
MDC IDC STAT BRADY RA PERCENT PACED: 43.61 %

## 2018-05-04 ENCOUNTER — Encounter: Payer: Self-pay | Admitting: Cardiology

## 2018-05-04 NOTE — Progress Notes (Signed)
Remote pacemaker transmission.   

## 2018-06-05 DIAGNOSIS — M818 Other osteoporosis without current pathological fracture: Secondary | ICD-10-CM | POA: Diagnosis not present

## 2018-06-05 DIAGNOSIS — N182 Chronic kidney disease, stage 2 (mild): Secondary | ICD-10-CM | POA: Diagnosis not present

## 2018-06-05 DIAGNOSIS — E785 Hyperlipidemia, unspecified: Secondary | ICD-10-CM | POA: Diagnosis not present

## 2018-06-05 DIAGNOSIS — I10 Essential (primary) hypertension: Secondary | ICD-10-CM | POA: Diagnosis not present

## 2018-07-27 ENCOUNTER — Ambulatory Visit (INDEPENDENT_AMBULATORY_CARE_PROVIDER_SITE_OTHER): Payer: Medicare Other | Admitting: *Deleted

## 2018-07-27 DIAGNOSIS — I495 Sick sinus syndrome: Secondary | ICD-10-CM

## 2018-07-27 DIAGNOSIS — I442 Atrioventricular block, complete: Secondary | ICD-10-CM

## 2018-07-27 LAB — CUP PACEART REMOTE DEVICE CHECK
Battery Remaining Longevity: 70 mo
Battery Voltage: 2.97 V
Brady Statistic AP VP Percent: 2.34 %
Brady Statistic AP VS Percent: 35.76 %
Brady Statistic AS VP Percent: 4.45 %
Brady Statistic AS VS Percent: 57.45 %
Brady Statistic RA Percent Paced: 40.18 %
Brady Statistic RV Percent Paced: 7 %
Date Time Interrogation Session: 20200611065829
Implantable Lead Implant Date: 20190528
Implantable Lead Implant Date: 20190528
Implantable Lead Location: 753859
Implantable Lead Location: 753860
Implantable Lead Model: 3830
Implantable Lead Model: 5076
Implantable Pulse Generator Implant Date: 20190528
Lead Channel Impedance Value: 342 Ohm
Lead Channel Impedance Value: 380 Ohm
Lead Channel Impedance Value: 399 Ohm
Lead Channel Impedance Value: 513 Ohm
Lead Channel Pacing Threshold Amplitude: 1.25 V
Lead Channel Pacing Threshold Amplitude: 2.375 V
Lead Channel Pacing Threshold Pulse Width: 0.4 ms
Lead Channel Pacing Threshold Pulse Width: 0.4 ms
Lead Channel Sensing Intrinsic Amplitude: 16.875 mV
Lead Channel Sensing Intrinsic Amplitude: 2 mV
Lead Channel Setting Pacing Amplitude: 3.5 V
Lead Channel Setting Pacing Amplitude: 5 V
Lead Channel Setting Pacing Pulse Width: 1 ms
Lead Channel Setting Sensing Sensitivity: 1.2 mV

## 2018-08-02 ENCOUNTER — Encounter: Payer: Self-pay | Admitting: Cardiology

## 2018-08-02 NOTE — Progress Notes (Signed)
Remote pacemaker transmission.   

## 2018-09-11 DIAGNOSIS — E785 Hyperlipidemia, unspecified: Secondary | ICD-10-CM | POA: Diagnosis not present

## 2018-09-11 DIAGNOSIS — I10 Essential (primary) hypertension: Secondary | ICD-10-CM | POA: Diagnosis not present

## 2018-09-11 DIAGNOSIS — N182 Chronic kidney disease, stage 2 (mild): Secondary | ICD-10-CM | POA: Diagnosis not present

## 2018-09-11 DIAGNOSIS — M818 Other osteoporosis without current pathological fracture: Secondary | ICD-10-CM | POA: Diagnosis not present

## 2018-10-16 ENCOUNTER — Other Ambulatory Visit: Payer: Self-pay | Admitting: Surgery

## 2018-10-16 DIAGNOSIS — I712 Thoracic aortic aneurysm, without rupture, unspecified: Secondary | ICD-10-CM

## 2018-10-16 DIAGNOSIS — I714 Abdominal aortic aneurysm, without rupture, unspecified: Secondary | ICD-10-CM

## 2018-10-26 ENCOUNTER — Ambulatory Visit (INDEPENDENT_AMBULATORY_CARE_PROVIDER_SITE_OTHER): Payer: Medicare Other | Admitting: *Deleted

## 2018-10-26 DIAGNOSIS — I442 Atrioventricular block, complete: Secondary | ICD-10-CM

## 2018-10-26 LAB — CUP PACEART REMOTE DEVICE CHECK
Battery Remaining Longevity: 82 mo
Battery Voltage: 2.98 V
Brady Statistic AP VP Percent: 19.44 %
Brady Statistic AP VS Percent: 33.74 %
Brady Statistic AS VP Percent: 19.59 %
Brady Statistic AS VS Percent: 27.24 %
Brady Statistic RA Percent Paced: 53.88 %
Brady Statistic RV Percent Paced: 39.26 %
Date Time Interrogation Session: 20200910070521
Implantable Lead Implant Date: 20190528
Implantable Lead Implant Date: 20190528
Implantable Lead Location: 753859
Implantable Lead Location: 753860
Implantable Lead Model: 3830
Implantable Lead Model: 5076
Implantable Pulse Generator Implant Date: 20190528
Lead Channel Impedance Value: 285 Ohm
Lead Channel Impedance Value: 342 Ohm
Lead Channel Impedance Value: 361 Ohm
Lead Channel Impedance Value: 608 Ohm
Lead Channel Pacing Threshold Amplitude: 1.75 V
Lead Channel Pacing Threshold Amplitude: 2.5 V
Lead Channel Pacing Threshold Pulse Width: 0.4 ms
Lead Channel Pacing Threshold Pulse Width: 0.4 ms
Lead Channel Sensing Intrinsic Amplitude: 15 mV
Lead Channel Sensing Intrinsic Amplitude: 2.375 mV
Lead Channel Setting Pacing Amplitude: 3.5 V
Lead Channel Setting Pacing Amplitude: 5 V
Lead Channel Setting Pacing Pulse Width: 1 ms
Lead Channel Setting Sensing Sensitivity: 1.2 mV

## 2018-11-06 NOTE — Progress Notes (Signed)
Remote pacemaker transmission.   

## 2018-11-08 ENCOUNTER — Ambulatory Visit (HOSPITAL_COMMUNITY): Payer: Medicare Other

## 2018-11-13 ENCOUNTER — Ambulatory Visit (INDEPENDENT_AMBULATORY_CARE_PROVIDER_SITE_OTHER): Payer: Medicare Other | Admitting: Surgery

## 2018-11-13 ENCOUNTER — Encounter: Payer: Self-pay | Admitting: Surgery

## 2018-11-13 ENCOUNTER — Ambulatory Visit (HOSPITAL_COMMUNITY)
Admission: RE | Admit: 2018-11-13 | Discharge: 2018-11-13 | Disposition: A | Discharge status: Home / Self Care | Payer: Medicare Other | Source: Ambulatory Visit | Attending: Surgery | Admitting: Surgery

## 2018-11-13 ENCOUNTER — Other Ambulatory Visit: Payer: Self-pay

## 2018-11-13 DIAGNOSIS — I714 Abdominal aortic aneurysm, without rupture, unspecified: Secondary | ICD-10-CM

## 2018-11-13 DIAGNOSIS — I712 Thoracic aortic aneurysm, without rupture, unspecified: Secondary | ICD-10-CM

## 2018-11-13 LAB — POCT I-STAT CREATININE: Creatinine, Ser: 1.2 mg/dL — ABNORMAL HIGH (ref 0.44–1.00)

## 2018-11-13 MED ORDER — IOHEXOL 350 MG/ML SOLN
75.0000 mL | Freq: Once | INTRAVENOUS | Status: AC | PRN
  Administered 2018-11-13: 60 mL via INTRAVENOUS

## 2018-11-13 NOTE — Progress Notes (Signed)
Vascular and Vein Specialist of [facility]  Patient name: Deborah Moses MRN: [id number] DOB: 1931-07-23 Sex: female      Virtual Visit via Telephone Note   This visit type was conducted due to national recommendations for restrictions regarding the COVID-19 Pandemic (e.g. social distancing) in an effort to limit this patient's exposure and mitigate transmission in our community.  Due to her co-morbid illnesses, this patient is at least at moderate risk for complications without adequate follow up.  This format is felt to be most appropriate for this patient at this time.  The patient did not have access to video technology/had technical difficulties with video requiring transitioning to audio format only (telephone).  All issues noted in this document were discussed and addressed.  No physical exam could be performed with this format.   Patient Location: Home Provider Location: Office    REASON FOR APPOINTMENT:    Follow up  HISTORY OF PRESENT ILLNESS:   Deborah Moses is a 85 y.o. female, who I began seeing in June 2019 for a 4.6 cm aneurysm.  She recently had a CT scan.  She denies any abdominal pain or back pain.  She denies chest pain or shortness of breath  Patient has a pacemaker for symptomatic bradycardia and third-degree heart block.  She is a non-smoker.  She is medically managed for hypertension.  She takes a statin for hypercholesterolemia.    The patient does not have symptoms concerning for COVID-19 infection (fever, chills, cough, or new shortness of breath).   PAST MEDICAL HISTORY    Past Medical History:  Diagnosis Date  . AAA (abdominal aortic aneurysm) ([place])   . Heart block AV third degree ([place])   . HTN (hypertension)   . Vertigo      FAMILY HISTORY   Family History  Problem Relation Age of Onset  . Heart failure Father     SOCIAL HISTORY:   Social History   Socioeconomic History  . Marital status: Widowed    Spouse name: Not on  file  . Number of children: 4  . Years of education: Not on file  . Highest education level: Not on file  Occupational History  . Not on file  Social Needs  . Financial resource strain: Not on file  . Food insecurity    Worry: Not on file    Inability: Not on file  . Transportation needs    Medical: Not on file    Non-medical: Not on file  Tobacco Use  . Smoking status: Never Smoker  . Smokeless tobacco: Never Used  Substance and Sexual Activity  . Alcohol use: Never    Frequency: Never  . Drug use: Not on file  . Sexual activity: Not on file  Lifestyle  . Physical activity    Days per week: Not on file    Minutes per session: Not on file  . Stress: Not on file  Relationships  . Social Herbalist on phone: Not on file    Gets together: Not on file    Attends religious service: Not on file    Active member of club or organization: Not on file    Attends meetings of clubs or organizations: Not on file    Relationship status: Not on file  . Intimate partner violence    Fear of current or ex partner: Not on file    Emotionally abused: Not on  file    Physically abused: Not on file    Forced sexual activity: Not on file  Other Topics Concern  . Not on file  Social History Narrative  . Not on file    ALLERGIES:    No Known Allergies  CURRENT MEDICATIONS:    Current Outpatient Medications  Medication Sig Dispense Refill  . acetaminophen (TYLENOL) 500 MG tablet Take 500 mg by mouth every 6 (six) hours as needed.    Marland Kitchen alendronate (FOSAMAX) 70 MG tablet Take 70 mg by mouth once a week. Take with a full glass of water on an empty stomach.    Marland Kitchen amLODipine (NORVASC) 5 MG tablet Take 5 mg by mouth daily.    Marland Kitchen apixaban (ELIQUIS) 2.5 MG TABS tablet Take 1 tablet (2.5 mg total) by mouth 2 (two) times daily. 180 tablet 3  . calcium-vitamin D (OSCAL WITH D) 500-200 MG-UNIT TABS tablet Take by mouth.    Marland Kitchen lisinopril (PRINIVIL,ZESTRIL) 20 MG tablet Take 40 mg by mouth  daily.     Marland Kitchen lovastatin (MEVACOR) 20 MG tablet Take 20 mg by mouth at bedtime.    . metoprolol succinate (TOPROL-XL) 25 MG 24 hr tablet Take 25 mg by mouth daily.    . Omega-3 Fatty Acids (FISH OIL) 1000 MG CPDR Take 1,000 mg by mouth daily.      No current facility-administered medications for this visit.     REVIEW OF SYSTEMS:   Please see the history of present illness.     All other systems reviewed and are negative.  PHYSICAL EXAM:   There were no vitals filed for this visit.  GENERAL: The patient is no acute distress.  PULMONARY: Nonlabored breathing  NEUROLOGIC: No slurred speach PSYCHIATRIC: The patient has a normal affect.   Recent Labs: 11/13/2018: Creatinine, Ser 1.20   Recent Lipid Panel Lab Results  Component Value Date/Time   CHOL 148 07/11/2017 12:28 AM   TRIG 167 (H) 07/11/2017 12:28 AM   HDL 42 07/11/2017 12:28 AM   CHOLHDL 3.5 07/11/2017 12:28 AM   LDLCALC 73 07/11/2017 12:28 AM    Wt Readings from Last 3 Encounters:  01/23/18 114 lb (51.7 kg)  10/27/17 112 lb 3.2 oz (50.9 kg)  07/18/17 120 lb 14.4 oz (54.8 kg)     STUDIES:   I have reviewed her scan with the following findings: IMPRESSION: 1. Aneurysmal dilatation of the distal aortic arch measuring up to 4.3 x 4.2 cm in diameter, and of the distal descending thoracic aorta which measures up to 3.8 x 3.5 cm. Recommend semi-annual imaging followup by CTA or MRA and referral to cardiothoracic surgery if not already obtained. This recommendation follows 2010 ACCF/AHA/AATS/ACR/ASA/SCA/SCAI/SIR/STS/SVM Guidelines for the Diagnosis and Management of Patients With Thoracic Aortic Disease. Circulation. 2010; 121: Z610-R60. Aortic aneurysm NOS (ICD10-I71.9). 2. There is also left main and 3 vessel coronary artery disease. 3. There are calcifications of the aortic valve. Echocardiographic correlation for evaluation of potential valvular dysfunction may be warranted if clinically indicated. 4. Large  hiatal hernia.  ASSESSMENT and PLAN   AAA:  No significant change in her aneurysm.  Abd / pelvis was not imaged today. I will repeat her CTA chest, abdomen and pelvis in 6 months   Time:   Today, I have spent 11 minutes with the patient with telehealth technology discussing the above problems.        Charlena Cross, MD, FACS [facility] of [facility] [phone] Pager [phone]

## 2018-12-18 DIAGNOSIS — M818 Other osteoporosis without current pathological fracture: Secondary | ICD-10-CM | POA: Diagnosis not present

## 2018-12-18 DIAGNOSIS — Z23 Encounter for immunization: Secondary | ICD-10-CM | POA: Diagnosis not present

## 2018-12-18 DIAGNOSIS — E7849 Other hyperlipidemia: Secondary | ICD-10-CM | POA: Diagnosis not present

## 2018-12-18 DIAGNOSIS — N182 Chronic kidney disease, stage 2 (mild): Secondary | ICD-10-CM | POA: Diagnosis not present

## 2018-12-18 DIAGNOSIS — I10 Essential (primary) hypertension: Secondary | ICD-10-CM | POA: Diagnosis not present

## 2019-01-04 ENCOUNTER — Telehealth: Payer: Self-pay | Admitting: *Deleted

## 2019-01-04 NOTE — Telephone Encounter (Signed)
Have completed patient [employer] pt assistance application for ELIQUIS. Will fax forms after DOD signs provider part.  

## 2019-01-23 ENCOUNTER — Other Ambulatory Visit: Payer: Self-pay | Admitting: Internal Medicine

## 2019-01-25 ENCOUNTER — Ambulatory Visit (INDEPENDENT_AMBULATORY_CARE_PROVIDER_SITE_OTHER): Payer: Medicare Other | Admitting: *Deleted

## 2019-01-25 DIAGNOSIS — I442 Atrioventricular block, complete: Secondary | ICD-10-CM | POA: Diagnosis not present

## 2019-01-25 LAB — CUP PACEART REMOTE DEVICE CHECK
Battery Remaining Longevity: 50 mo
Battery Voltage: 2.96 V
Brady Statistic AP VP Percent: 22.59 %
Brady Statistic AP VS Percent: 27.57 %
Brady Statistic AS VP Percent: 30.36 %
Brady Statistic AS VS Percent: 19.48 %
Brady Statistic RA Percent Paced: 50.49 %
Brady Statistic RV Percent Paced: 53.37 %
Date Time Interrogation Session: 20201210020525
Implantable Lead Implant Date: 20190528
Implantable Lead Implant Date: 20190528
Implantable Lead Location: 753859
Implantable Lead Location: 753860
Implantable Lead Model: 3830
Implantable Lead Model: 5076
Implantable Pulse Generator Implant Date: 20190528
Lead Channel Impedance Value: 285 Ohm
Lead Channel Impedance Value: 342 Ohm
Lead Channel Impedance Value: 361 Ohm
Lead Channel Impedance Value: 475 Ohm
Lead Channel Pacing Threshold Amplitude: 1.375 V
Lead Channel Pacing Threshold Amplitude: 1.5 V
Lead Channel Pacing Threshold Pulse Width: 0.4 ms
Lead Channel Pacing Threshold Pulse Width: 0.4 ms
Lead Channel Sensing Intrinsic Amplitude: 16 mV
Lead Channel Sensing Intrinsic Amplitude: 16 mV
Lead Channel Sensing Intrinsic Amplitude: 3.125 mV
Lead Channel Sensing Intrinsic Amplitude: 3.125 mV
Lead Channel Setting Pacing Amplitude: 3.5 V
Lead Channel Setting Pacing Amplitude: 5 V
Lead Channel Setting Pacing Pulse Width: 1 ms
Lead Channel Setting Sensing Sensitivity: 1.2 mV

## 2019-02-05 NOTE — Telephone Encounter (Signed)
Received notification that [employer] has received her Pt Asst Application. They will review and notify us when they determine if the pt is eligible for the program.

## 2019-02-13 NOTE — Telephone Encounter (Signed)
**Note De-Identified Quavion Boule Obfuscation** We have re-faxed the provider page to [employer]MS as requested.

## 2019-02-13 NOTE — Telephone Encounter (Signed)
**Note De-Identified Deborah Moses Obfuscation** Fax from [place] stating they need the provider part of the pts pt asst application for Eliquis.

## 2019-02-28 NOTE — Telephone Encounter (Signed)
**Note De-Identified Jeslie Lowe Obfuscation** Letter received from [employer] stating that they have approved the pt for asst with her Eliquis. Approval good until 02/15/2020 Application Case#: [id number]  The letter states that they have notified the pt of this approval as well.

## 2019-03-03 NOTE — Progress Notes (Signed)
PPM remote 

## 2019-03-14 ENCOUNTER — Encounter: Payer: Self-pay | Admitting: Internal Medicine

## 2019-03-14 ENCOUNTER — Ambulatory Visit (INDEPENDENT_AMBULATORY_CARE_PROVIDER_SITE_OTHER): Payer: Medicare Other | Admitting: Internal Medicine

## 2019-03-14 ENCOUNTER — Other Ambulatory Visit: Payer: Self-pay

## 2019-03-14 VITALS — BP 160/88 | HR 94 | Temp 97.4°F | Ht 64.0 in | Wt 124.0 lb

## 2019-03-14 DIAGNOSIS — I442 Atrioventricular block, complete: Secondary | ICD-10-CM | POA: Diagnosis not present

## 2019-03-14 NOTE — Patient Instructions (Signed)
Medication Instructions:  Your physician recommends that you continue on your current medications as directed. Please refer to the Current Medication list given to you today.  *If you need a refill on your cardiac medications before your next appointment, please call your pharmacy*  Lab Work: NONE  If you have labs (blood work) drawn today and your tests are completely normal, you will receive your results only by: . MyChart Message (if you have MyChart) OR . A paper copy in the mail If you have any lab test that is abnormal or we need to change your treatment, we will call you to review the results.  Testing/Procedures: NONE   Follow-Up: At [facility], you and your health needs are our priority.  As part of our continuing mission to provide you with exceptional heart care, we have created designated Provider Care Teams.  These Care Teams include your primary Cardiologist (physician) and Advanced Practice Providers (APPs -  Physician Assistants and Nurse Practitioners) who all work together to provide you with the care you need, when you need it.  Your next appointment:   1 year(s)  The format for your next appointment:   In Person  Provider:   Gregg Taylor, MD  Other Instructions Thank you for choosing [facility]!    

## 2019-03-14 NOTE — Progress Notes (Signed)
HPI Deborah Moses returns today for followup. She has a h/o PAF, CHB, s/p PPM insertion. She denies chest pain or sob. No palpitations. She denies peripheral edema. No Covid infection. No  Bleeding on Eliquis.  No Known Allergies   Current Outpatient Medications  Medication Sig Dispense Refill  . acetaminophen (TYLENOL) 500 MG tablet Take 500 mg by mouth every 6 (six) hours as needed.    Marland Kitchen alendronate (FOSAMAX) 70 MG tablet Take 70 mg by mouth once a week. Take with a full glass of water on an empty stomach.    Marland Kitchen amLODipine (NORVASC) 5 MG tablet Take 5 mg by mouth daily.    . calcium-vitamin D (OSCAL WITH D) 500-200 MG-UNIT TABS tablet Take by mouth.    . ELIQUIS 2.5 MG TABS tablet TAKE 1 TABLET BY MOUTH TWICE DAILY 180 tablet 1  . lisinopril (PRINIVIL,ZESTRIL) 20 MG tablet Take 40 mg by mouth daily.     Marland Kitchen lovastatin (MEVACOR) 20 MG tablet Take 20 mg by mouth at bedtime.    . metoprolol succinate (TOPROL-XL) 25 MG 24 hr tablet Take 25 mg by mouth daily.    . Omega-3 Fatty Acids (FISH OIL) 1000 MG CPDR Take 1,000 mg by mouth daily.      No current facility-administered medications for this visit.     Past Medical History:  Diagnosis Date  . AAA (abdominal aortic aneurysm) (HCC)   . Heart block AV third degree (HCC)   . HTN (hypertension)   . Hyperlipidemia   . Vertigo     ROS:   All systems reviewed and negative except as noted in the HPI.   Past Surgical History:  Procedure Laterality Date  . PACEMAKER IMPLANT N/A 07/12/2017   Procedure: PACEMAKER IMPLANT;  Surgeon: Marinus Maw, MD;  Location: [facility] INVASIVE CV LAB;  Service: Cardiovascular;  Laterality: N/A;     Family History  Problem Relation Age of Onset  . Heart failure Father      Social History   Socioeconomic History  . Marital status: Widowed    Spouse name: Not on file  . Number of children: 4  . Years of education: Not on file  . Highest education level: Not on file  Occupational History  .  Not on file  Tobacco Use  . Smoking status: Never Smoker  . Smokeless tobacco: Never Used  Substance and Sexual Activity  . Alcohol use: Never  . Drug use: Never  . Sexual activity: Not on file  Other Topics Concern  . Not on file  Social History Narrative  . Not on file   Social Determinants of Health   Financial Resource Strain:   . Difficulty of Paying Living Expenses: Not on file  Food Insecurity:   . Worried About Programme researcher, broadcasting/film/video in the Last Year: Not on file  . Ran Out of Food in the Last Year: Not on file  Transportation Needs:   . Lack of Transportation (Medical): Not on file  . Lack of Transportation (Non-Medical): Not on file  Physical Activity:   . Days of Exercise per Week: Not on file  . Minutes of Exercise per Session: Not on file  Stress:   . Feeling of Stress : Not on file  Social Connections:   . Frequency of Communication with Friends and Family: Not on file  . Frequency of Social Gatherings with Friends and Family: Not on file  . Attends Religious Services: Not on file  .  Active Member of Clubs or Organizations: Not on file  . Attends Archivist Meetings: Not on file  . Marital Status: Not on file  Intimate Partner Violence:   . Fear of Current or Ex-Partner: Not on file  . Emotionally Abused: Not on file  . Physically Abused: Not on file  . Sexually Abused: Not on file     BP (!) 160/88   Pulse 94   Temp (!) 97.4 F (36.3 C)   Ht 5\' 4"  (1.626 m)   Wt 124 lb (56.2 kg)   SpO2 97%   BMI 21.28 kg/m   Physical Exam:  Well appearing elderly woman, NAD HEENT: Unremarkable Neck:  6 cm JVD, no thyromegally Lymphatics:  No adenopathy Back:  No CVA tenderness Lungs:  Clear with no wheezes HEART:  Regular rate rhythm, no murmurs, no rubs, no clicks Abd:  soft, positive bowel sounds, no organomegally, no rebound, no guarding Ext:  2 plus pulses, no edema, no cyanosis, no clubbing Skin:  No rashes no nodules Neuro:  CN II through  XII intact, motor grossly intact   DEVICE  Normal device function.  See PaceArt for details. Interrogation of her device demonstrates PAF/flutter. She has some far field atrial oversensing. She has a ventricular threshold of 1 volt at 1 ms. P waves measured 2.3 and no R waves due to PM dependence. Impedence was 494/570 in the A and V.  Assess/Plan: 1. CHB - she is dependent today but asymptomatic, s/p PPM insertion.  2. PPM - her Medtronic DDD PM is working normally. We will recheck in several months. 3. HTN - her bp is high today but she has not had her meds. It has been better at home. 4. PAF - she is asymptomatic and her VR is well controlled. We will follow. She will continue Eliquis.  Mikle Bosworth.D.

## 2019-03-20 ENCOUNTER — Telehealth: Payer: Self-pay | Admitting: *Deleted

## 2019-03-20 NOTE — Telephone Encounter (Signed)
PPM alert received for AF episode >6hrs. Presenting rhythm suggests atrial non-capture with retrograde P-waves falling in PVARP. Available "AF" EGMs all appear false episodes due to FFRWs and atrial non-capture. On Eliquis for a history of PAF. RA output reduced to 2.5V @ 0.85ms at OV on 03/14/19. RV capture appears appropriate.   Spoke with patient. She reports that she has been doing well since her appointment with Dr. Ladona Ridgel on 03/14/19, denies any cardiac symptoms at this time. Advised will discuss with Dr. Ladona Ridgel regarding potential reprogramming recommendations and call back later this week. Pt verbalizes understanding and agreement with plan.

## 2019-03-21 NOTE — Telephone Encounter (Signed)
Attempted to reach pt to offer DC appointment for RA lead testing and possible reprogramming. No answer, no option to LM.

## 2019-03-22 NOTE — Telephone Encounter (Signed)
Discussed with Dr. Ladona Ridgel, f/u on 04/06/19 is appropriate as patient is asymptomatic.  Patient is agreeable to appointment on 04/06/19 at 8:30am with [facility] in [place]. Patient is unable to come later that day due to transportation difficulties. She is aware to call back with any concerns prior to this appointment.

## 2019-03-22 NOTE — Telephone Encounter (Signed)
Spoke with patient. She reports she feels good. She is able to come to [facility] on Mondays only when her daughter is off work, but has more access to transportation if the appointment can be scheduled in [place]. Advised pt will discuss with Gypsy Balsam, NP, who will be in [place] on 04/06/19, then call her back later today. Pt is in agreement with plan.

## 2019-04-02 DIAGNOSIS — E7849 Other hyperlipidemia: Secondary | ICD-10-CM | POA: Diagnosis not present

## 2019-04-02 DIAGNOSIS — M818 Other osteoporosis without current pathological fracture: Secondary | ICD-10-CM | POA: Diagnosis not present

## 2019-04-02 DIAGNOSIS — N182 Chronic kidney disease, stage 2 (mild): Secondary | ICD-10-CM | POA: Diagnosis not present

## 2019-04-02 DIAGNOSIS — I10 Essential (primary) hypertension: Secondary | ICD-10-CM | POA: Diagnosis not present

## 2019-04-05 ENCOUNTER — Telehealth: Payer: Self-pay | Admitting: Internal Medicine

## 2019-04-05 NOTE — Telephone Encounter (Signed)
Please give pt a call to r/s her device check that was scheduled in the [place] office for tomorrow 2/19-   I was unsure of the time frame pt needed to be seen in and we currently do not have a device schedule for [place]

## 2019-04-05 NOTE — Telephone Encounter (Signed)
Spoke with patient. She is agreeable to rescheduling DC appointment in [place] to 04/18/19 at 8:30am. No additional concerns at this time.

## 2019-04-06 NOTE — Telephone Encounter (Signed)
Spoke with patient. Due to relying on her daughters for transportation, she is not able to get to [facility] easily. She continues to deny symptoms with ? RA non-capture first noted on 03/19/19 transmission. Pt is agreeable to moving appointment to 04/26/19 at 9:30am. She will confirm with her daughter and call us back if this appointment doesn't work for her. Direct DC number provided. Pt appreciative of call and denies questions or concerns at this time.

## 2019-04-06 NOTE — Telephone Encounter (Signed)
Pt called stating she cannot come on 04/18/19 until late that afternoon- is there another day that device will be here for her to get checked.

## 2019-04-10 ENCOUNTER — Other Ambulatory Visit: Payer: Self-pay

## 2019-04-10 DIAGNOSIS — I998 Other disorder of circulatory system: Secondary | ICD-10-CM

## 2019-04-26 ENCOUNTER — Ambulatory Visit (HOSPITAL_COMMUNITY)
Admission: RE | Admit: 2019-04-26 | Discharge: 2019-04-26 | Disposition: A | Discharge status: Home / Self Care | Payer: Medicare Other | Source: Ambulatory Visit | Attending: Internal Medicine | Admitting: Internal Medicine

## 2019-04-26 ENCOUNTER — Ambulatory Visit (INDEPENDENT_AMBULATORY_CARE_PROVIDER_SITE_OTHER): Payer: Medicare Other | Admitting: *Deleted

## 2019-04-26 ENCOUNTER — Other Ambulatory Visit: Payer: Self-pay

## 2019-04-26 VITALS — BP 140/70 | HR 68 | Temp 98.6°F | Ht 64.0 in

## 2019-04-26 DIAGNOSIS — Z950 Presence of cardiac pacemaker: Secondary | ICD-10-CM | POA: Diagnosis not present

## 2019-04-26 DIAGNOSIS — K449 Diaphragmatic hernia without obstruction or gangrene: Secondary | ICD-10-CM | POA: Diagnosis not present

## 2019-04-26 DIAGNOSIS — I442 Atrioventricular block, complete: Secondary | ICD-10-CM

## 2019-04-26 LAB — CUP PACEART REMOTE DEVICE CHECK
Battery Remaining Longevity: 93 mo
Battery Voltage: 2.97 V
Brady Statistic AP VP Percent: 41.91 %
Brady Statistic AP VS Percent: 2.92 %
Brady Statistic AS VP Percent: 53.85 %
Brady Statistic AS VS Percent: 1.24 %
Brady Statistic RA Percent Paced: 57.08 %
Brady Statistic RV Percent Paced: 96.41 %
Date Time Interrogation Session: 20210311020521
Implantable Lead Implant Date: 20190528
Implantable Lead Implant Date: 20190528
Implantable Lead Location: 753859
Implantable Lead Location: 753860
Implantable Lead Model: 3830
Implantable Lead Model: 5076
Implantable Pulse Generator Implant Date: 20190528
Lead Channel Impedance Value: 285 Ohm
Lead Channel Impedance Value: 342 Ohm
Lead Channel Impedance Value: 361 Ohm
Lead Channel Impedance Value: 475 Ohm
Lead Channel Pacing Threshold Amplitude: 1.375 V
Lead Channel Pacing Threshold Amplitude: 1.5 V
Lead Channel Pacing Threshold Pulse Width: 0.4 ms
Lead Channel Pacing Threshold Pulse Width: 0.4 ms
Lead Channel Sensing Intrinsic Amplitude: 15.75 mV
Lead Channel Sensing Intrinsic Amplitude: 15.75 mV
Lead Channel Sensing Intrinsic Amplitude: 2.75 mV
Lead Channel Sensing Intrinsic Amplitude: 2.75 mV
Lead Channel Setting Pacing Amplitude: 2.5 V
Lead Channel Setting Pacing Amplitude: 2.5 V
Lead Channel Setting Pacing Pulse Width: 1 ms
Lead Channel Setting Sensing Sensitivity: 1.2 mV

## 2019-04-26 LAB — CUP PACEART INCLINIC DEVICE CHECK
Battery Remaining Longevity: 71 mo
Battery Voltage: 2.97 V
Brady Statistic AP VP Percent: 42.52 %
Brady Statistic AP VS Percent: 2.51 %
Brady Statistic AS VP Percent: 53.78 %
Brady Statistic AS VS Percent: 1.13 %
Brady Statistic RA Percent Paced: 57 %
Brady Statistic RV Percent Paced: 96.83 %
Date Time Interrogation Session: 20210311134449
Implantable Lead Implant Date: 20190528
Implantable Lead Implant Date: 20190528
Implantable Lead Location: 753859
Implantable Lead Location: 753860
Implantable Lead Model: 3830
Implantable Lead Model: 5076
Implantable Pulse Generator Implant Date: 20190528
Lead Channel Impedance Value: 380 Ohm
Lead Channel Impedance Value: 399 Ohm
Lead Channel Impedance Value: 456 Ohm
Lead Channel Impedance Value: 494 Ohm
Lead Channel Pacing Threshold Amplitude: 1 V
Lead Channel Pacing Threshold Amplitude: 2.75 V
Lead Channel Pacing Threshold Pulse Width: 0.8 ms
Lead Channel Pacing Threshold Pulse Width: 1 ms
Lead Channel Sensing Intrinsic Amplitude: 1.625 mV
Lead Channel Setting Pacing Amplitude: 2.5 V
Lead Channel Setting Pacing Amplitude: 4 V
Lead Channel Setting Pacing Pulse Width: 1 ms
Lead Channel Setting Sensing Sensitivity: 1.2 mV

## 2019-04-26 NOTE — Progress Notes (Signed)
PPM Remote  

## 2019-04-26 NOTE — Progress Notes (Signed)
Pacemaker check in clinic, added-on for atrial non-capture noted on remote transmission. RV (HBP) threshold and impedance consistent with previous measurements. RA threshold now 2.75V @ 0.54ms, sensing and impedance trends stable. Initial concern for crosstalk with RA PW at 1.66ms, but EGM indicates bigeminal PVCs per Dr. Ladona Ridgel. Changes per Dr. Ladona Ridgel: RA output fixed at 4.0V @ 0.63ms, lower rate reduced to 50bpm, rate response enabled with ADL and exertion response at 2. Plan for CXR today. 3628 AT/AF episodes (23.8% burden)--all available EGMs show FFRWs and intrinsic/retrograde P-waves due to RA non-capture; PVAB method reprogrammed to Partial+. No high ventricular rates noted. Device programmed at appropriate safety margins. Estimated longevity 6 years. Patient enrolled in remote follow-up. Patient education completed. [facility] on 07/26/19 and ROV with Dr. Ladona Ridgel in [place] in 3 months.

## 2019-05-04 ENCOUNTER — Telehealth: Payer: Self-pay | Admitting: *Deleted

## 2019-05-04 NOTE — Telephone Encounter (Signed)
Spoke with patient to provide CXR results from 04/26/19. Per Dr. Ladona Ridgel, CXR shows stable PPM lead position. Will plan to keep current f/u on 07/23/19 with Dr. Ladona Ridgel in [place]. Pt verbalize understanding and appreciation of call.

## 2019-05-09 ENCOUNTER — Other Ambulatory Visit: Payer: Medicare Other

## 2019-05-14 ENCOUNTER — Ambulatory Visit: Payer: Medicare Other | Admitting: Surgery

## 2019-05-14 ENCOUNTER — Other Ambulatory Visit (HOSPITAL_COMMUNITY): Payer: Medicare Other

## 2019-05-22 ENCOUNTER — Other Ambulatory Visit: Payer: Self-pay

## 2019-05-22 MED ORDER — APIXABAN 2.5 MG PO TABS: 2.5000 mg | ORAL_TABLET | Freq: Two times a day (BID) | ORAL | 3 refills | Status: DC

## 2019-05-22 NOTE — Telephone Encounter (Signed)
Refilled Eliquis to [facility].

## 2019-05-30 ENCOUNTER — Other Ambulatory Visit: Payer: Self-pay

## 2019-05-30 DIAGNOSIS — I714 Abdominal aortic aneurysm, without rupture, unspecified: Secondary | ICD-10-CM

## 2019-06-04 ENCOUNTER — Ambulatory Visit: Payer: Medicare Other | Admitting: Surgery

## 2019-06-04 ENCOUNTER — Other Ambulatory Visit (HOSPITAL_COMMUNITY): Payer: Medicare Other

## 2019-06-11 ENCOUNTER — Other Ambulatory Visit (HOSPITAL_COMMUNITY): Payer: Medicare Other

## 2019-06-11 ENCOUNTER — Ambulatory Visit: Payer: Medicare Other | Admitting: Surgery

## 2019-06-13 ENCOUNTER — Other Ambulatory Visit: Payer: Self-pay

## 2019-06-25 ENCOUNTER — Other Ambulatory Visit (HOSPITAL_COMMUNITY): Payer: Medicare Other

## 2019-06-25 ENCOUNTER — Ambulatory Visit: Payer: Medicare Other | Admitting: Surgery

## 2019-06-25 DIAGNOSIS — N182 Chronic kidney disease, stage 2 (mild): Secondary | ICD-10-CM | POA: Diagnosis not present

## 2019-06-25 DIAGNOSIS — M818 Other osteoporosis without current pathological fracture: Secondary | ICD-10-CM | POA: Diagnosis not present

## 2019-06-25 DIAGNOSIS — I10 Essential (primary) hypertension: Secondary | ICD-10-CM | POA: Diagnosis not present

## 2019-06-25 DIAGNOSIS — E7849 Other hyperlipidemia: Secondary | ICD-10-CM | POA: Diagnosis not present

## 2019-07-02 ENCOUNTER — Other Ambulatory Visit: Payer: Medicare Other

## 2019-07-03 ENCOUNTER — Ambulatory Visit (HOSPITAL_COMMUNITY)
Admission: RE | Admit: 2019-07-03 | Discharge: 2019-07-03 | Disposition: A | Discharge status: Home / Self Care | Payer: Medicare Other | Source: Ambulatory Visit | Attending: Surgery | Admitting: Surgery

## 2019-07-03 ENCOUNTER — Other Ambulatory Visit: Payer: Self-pay

## 2019-07-03 ENCOUNTER — Other Ambulatory Visit: Payer: Self-pay | Admitting: *Deleted

## 2019-07-03 DIAGNOSIS — I712 Thoracic aortic aneurysm, without rupture: Secondary | ICD-10-CM | POA: Diagnosis not present

## 2019-07-03 DIAGNOSIS — I714 Abdominal aortic aneurysm, without rupture, unspecified: Secondary | ICD-10-CM

## 2019-07-03 DIAGNOSIS — I716 Thoracoabdominal aortic aneurysm, without rupture: Secondary | ICD-10-CM | POA: Diagnosis not present

## 2019-07-03 LAB — POCT I-STAT CREATININE: Creatinine, Ser: 1 mg/dL (ref 0.44–1.00)

## 2019-07-03 MED ORDER — IOHEXOL 350 MG/ML SOLN
100.0000 mL | Freq: Once | INTRAVENOUS | Status: AC | PRN
  Administered 2019-07-03: 75 mL via INTRAVENOUS

## 2019-07-06 ENCOUNTER — Telehealth (HOSPITAL_COMMUNITY): Payer: Self-pay

## 2019-07-06 NOTE — Telephone Encounter (Signed)
The above patient or their representative was contacted and gave the following answers to these questions:         Do you have any of the following symptoms?    NO  Fever                    Cough                   Shortness of breath  Do  you have any of the following other symptoms?    muscle pain         vomiting,        diarrhea        rash         weakness        red eye        abdominal pain         bruising          bruising or bleeding              joint pain           severe headache    Have you been in contact with someone who was or has been sick in the past 2 weeks?  NO  Yes                 Unsure                         Unable to assess   Does the person that you were in contact with have any of the following symptoms?   Cough         shortness of breath           muscle pain         vomiting,            diarrhea            rash            weakness           fever            red eye           abdominal pain           bruising  or  bleeding                joint pain                severe headache                 COMMENTS OR ACTION PLAN FOR THIS PATIENT:        07/06/19 PATIENT IS FULLY VACCINATED/[facility] 

## 2019-07-09 ENCOUNTER — Other Ambulatory Visit: Payer: Self-pay

## 2019-07-09 ENCOUNTER — Ambulatory Visit (INDEPENDENT_AMBULATORY_CARE_PROVIDER_SITE_OTHER)
Admission: RE | Admit: 2019-07-09 | Discharge: 2019-07-09 | Disposition: A | Discharge status: Home / Self Care | Payer: Medicare Other | Source: Ambulatory Visit | Attending: Surgery | Admitting: Surgery

## 2019-07-09 ENCOUNTER — Ambulatory Visit (INDEPENDENT_AMBULATORY_CARE_PROVIDER_SITE_OTHER): Payer: Medicare Other | Admitting: Surgery

## 2019-07-09 ENCOUNTER — Other Ambulatory Visit: Payer: Self-pay | Admitting: *Deleted

## 2019-07-09 ENCOUNTER — Ambulatory Visit (HOSPITAL_COMMUNITY)
Admission: RE | Admit: 2019-07-09 | Discharge: 2019-07-09 | Disposition: A | Discharge status: Home / Self Care | Payer: Medicare Other | Source: Ambulatory Visit | Attending: Surgery | Admitting: Surgery

## 2019-07-09 ENCOUNTER — Encounter: Payer: Self-pay | Admitting: Surgery

## 2019-07-09 VITALS — BP 148/80 | HR 67 | Temp 97.6°F | Resp 20 | Ht 64.0 in | Wt 123.0 lb

## 2019-07-09 DIAGNOSIS — R0989 Other specified symptoms and signs involving the circulatory and respiratory systems: Secondary | ICD-10-CM

## 2019-07-09 DIAGNOSIS — I714 Abdominal aortic aneurysm, without rupture, unspecified: Secondary | ICD-10-CM

## 2019-07-09 DIAGNOSIS — I998 Other disorder of circulatory system: Secondary | ICD-10-CM | POA: Diagnosis not present

## 2019-07-09 NOTE — Progress Notes (Signed)
Vascular and Vein Specialist of [facility]  Patient name: Deborah Moses MRN: [id number] DOB: 1931/05/11 Sex: female   REASON FOR VISIT:    Follow up  HISOTRY OF PRESENT ILLNESS:    Deborah Moses is a 86 y.o. female with a known abdominal aortic aneurysm.  This measured 4.6 cm in June 2019.  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  She is back today for follow-up.  She denies any abdominal pain or back pain.  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  She is a non-smoker.  She is medically managed for hypertension.  She takes a statin for hypercholesterolemia.  PAST MEDICAL HISTORY:   Past Medical History:  Diagnosis Date  . AAA (abdominal aortic aneurysm) (HCC)   . Heart block AV third degree (HCC)   . HTN (hypertension)   . Hyperlipidemia   . Vertigo      FAMILY HISTORY:   Family History  Problem Relation Age of Onset  . Heart failure Father     SOCIAL HISTORY:   Social History   Tobacco Use  . Smoking status: Never Smoker  . Smokeless tobacco: Never Used  Substance Use Topics  . Alcohol use: Never     ALLERGIES:   No Known Allergies   CURRENT MEDICATIONS:   Current Outpatient Medications  Medication Sig Dispense Refill  . acetaminophen (TYLENOL) 500 MG tablet Take 500 mg by mouth every 6 (six) hours as needed.    Marland Kitchen alendronate (FOSAMAX) 70 MG tablet Take 70 mg by mouth once a week. Take with a full glass of water on an empty stomach.    Marland Kitchen amLODipine (NORVASC) 5 MG tablet Take 5 mg by mouth daily.    Marland Kitchen apixaban (ELIQUIS) 2.5 MG TABS tablet Take 1 tablet (2.5 mg total) by mouth 2 (two) times daily. 180 tablet 3  . calcium-vitamin D (OSCAL WITH D) 500-200 MG-UNIT TABS tablet Take by mouth.    Marland Kitchen lisinopril (PRINIVIL,ZESTRIL) 20 MG tablet Take 40 mg by mouth daily.     Marland Kitchen lovastatin (MEVACOR) 20 MG tablet Take 20 mg by mouth  at bedtime.    . metoprolol succinate (TOPROL-XL) 25 MG 24 hr tablet Take 25 mg by mouth daily.    . Omega-3 Fatty Acids (FISH OIL) 1000 MG CPDR Take 1,000 mg by mouth daily.      No current facility-administered medications for this visit.    REVIEW OF SYSTEMS:   [X]  denotes positive finding, [ ]  denotes negative finding Cardiac  Comments:  Chest pain or chest pressure:    Shortness of breath upon exertion:    Short of breath when lying flat:    Irregular heart rhythm:        Vascular    Pain in calf, thigh, or hip brought on by ambulation:    Pain in feet at night that wakes you up from your sleep:     Blood clot in your veins:    Leg swelling:         Pulmonary    Oxygen at home:    Productive cough:     Wheezing:         Neurologic    Sudden weakness in arms or legs:     Sudden numbness in arms or legs:     Sudden onset of difficulty speaking or slurred speech:    Temporary loss of vision in one eye:  Problems with dizziness:         Gastrointestinal    Blood in stool:     Vomited blood:         Genitourinary    Burning when urinating:     Blood in urine:        Psychiatric    Major depression:         Hematologic    Bleeding problems:    Problems with blood clotting too easily:        Skin    Rashes or ulcers:        Constitutional    Fever or chills:      PHYSICAL EXAM:   Vitals:   07/09/19 0826  BP: (!) 148/80  Pulse: 67  Resp: 20  Temp: 97.6 F (36.4 C)  SpO2: 93%  Weight: 123 lb (55.8 kg)  Height: 5\' 4"  (1.626 m)    GENERAL: The patient is a well-nourished female, in no acute distress. The vital signs are documented above. CARDIAC: There is a regular rate and rhythm.  VASCULAR: I do not palpate pedal pulses PULMONARY: Non-labored respirations ABDOMEN: Soft and non-tender with normal pitched bowel sounds.  MUSCULOSKELETAL: There are no major deformities or cyanosis. NEUROLOGIC: No focal weakness or paresthesias are  detected. SKIN: There are no ulcers or rashes noted. PSYCHIATRIC: The patient has a normal affect.  STUDIES:   I have reviewed the following CTA: 1. Enlarging fusiform infrarenal abdominal aortic aneurysm with maximal diameter of 5.2 x 4.9 cm compared to 4.3 x 4.2 cm in December of 2019. Aortic aneurysm NOS (ICD10-I71.9); Aortic Atherosclerosis (ICD10-I70.0). 2. Stable to incrementally enlarged fusiform aneurysmal dilation of the visceral aorta with a maximal diameter of 3.9 cm compared to 3.8 cm in September of 2020. 3. Moderate bilateral renal artery stenoses secondary to calcified atherosclerotic plaque. Additionally, there is early branching on the right with beading of the division renal artery suggesting underlying fibromuscular dysplasia. 4. Extensive colonic diverticulosis without evidence of active diverticulitis. 5. Pelvic floor laxity. 6. Lower lumbar degenerative disc disease.  +-------+-----------+-----------+------------+------------+  ABI/TBIToday's ABIToday's TBIPrevious ABIPrevious TBI  +-------+-----------+-----------+------------+------------+  Right 0.71    0.42                  +-------+-----------+-----------+------------+------------+  Left  0.66    0.49                  +-------+-----------+-----------+------------+------------+    CAROTID: Right Carotid: Velocities in the right ICA are consistent with a 1-39%  stenosis.         Non-hemodynamically significant plaque <50% noted in the  CCA. The         ECA appears <50% stenosed. Right ICA was very tortuous  limiting         some views of the mid and distal segments.         Innominate artery demontrates non-hemodynamically  significant         mixed echogenicity plaque; 66 cm/s.   Left Carotid: Velocities in the left ICA are consistent with a 40-59%  stenosis.        Non-hemodynamically  significant plaque <50% noted in the  CCA. The        ECA appears <50% stenosed. Left ICA was very tortuous  limiting        some views of the mid and distal segments.   Vertebrals: Bilateral vertebral arteries demonstrate antegrade flow.  Subclavians: Normal flow hemodynamics were seen in bilateral subclavian  arteries MEDICAL ISSUES:   5.2 cm infrarenal abdominal aortic aneurysm: This has grown over a centimeter in approximately 2 years.  We discussed proceeding with repair despite her age.  We discussed the risks and benefits of surgery.  She is leaning towards getting it fixed due to concerns over rupture which she does think about frequently.  If she decides to proceed with repair, she will need clearance from Dr. Lovena Le with EP as well as cardiology.  She will need to be off of her Eliquis.  I have also encouraged her to speak with her primary care doctor, Dr. Deforest Hoyles before making a final decision.    Leia Alf, MD, FACS [facility]in Specialists of [facility] [phone] Pager [phone]

## 2019-07-09 NOTE — H&P (View-Only) (Signed)
 Vascular and Vein Specialist of [place]  Patient name: Deborah Moses MRN: [id number] DOB: 05/22/1931 Sex: female   REASON FOR VISIT:    Follow up  HISOTRY OF PRESENT ILLNESS:    Deborah Moses is a 86 y.o. female with a known abdominal aortic aneurysm.  This measured 4.6 cm in June 2019.  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  She is back today for follow-up.  She denies any abdominal pain or back pain.  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  She is a non-smoker.  She is medically managed for hypertension.  She takes a statin for hypercholesterolemia.  PAST MEDICAL HISTORY:   Past Medical History:  Diagnosis Date  . AAA (abdominal aortic aneurysm) (HCC)   . Heart block AV third degree (HCC)   . HTN (hypertension)   . Hyperlipidemia   . Vertigo      FAMILY HISTORY:   Family History  Problem Relation Age of Onset  . Heart failure Father     SOCIAL HISTORY:   Social History   Tobacco Use  . Smoking status: Never Smoker  . Smokeless tobacco: Never Used  Substance Use Topics  . Alcohol use: Never     ALLERGIES:   No Known Allergies   CURRENT MEDICATIONS:   Current Outpatient Medications  Medication Sig Dispense Refill  . acetaminophen (TYLENOL) 500 MG tablet Take 500 mg by mouth every 6 (six) hours as needed.    . alendronate (FOSAMAX) 70 MG tablet Take 70 mg by mouth once a week. Take with a full glass of water on an empty stomach.    . amLODipine (NORVASC) 5 MG tablet Take 5 mg by mouth daily.    . apixaban (ELIQUIS) 2.5 MG TABS tablet Take 1 tablet (2.5 mg total) by mouth 2 (two) times daily. 180 tablet 3  . calcium-vitamin D (OSCAL WITH D) 500-200 MG-UNIT TABS tablet Take by mouth.    . lisinopril (PRINIVIL,ZESTRIL) 20 MG tablet Take 40 mg by mouth daily.     . lovastatin (MEVACOR) 20 MG tablet Take 20 mg by mouth  at bedtime.    . metoprolol succinate (TOPROL-XL) 25 MG 24 hr tablet Take 25 mg by mouth daily.    . Omega-3 Fatty Acids (FISH OIL) 1000 MG CPDR Take 1,000 mg by mouth daily.      No current facility-administered medications for this visit.    REVIEW OF SYSTEMS:   [X] denotes positive finding, [ ] denotes negative finding Cardiac  Comments:  Chest pain or chest pressure:    Shortness of breath upon exertion:    Short of breath when lying flat:    Irregular heart rhythm:        Vascular    Pain in calf, thigh, or hip brought on by ambulation:    Pain in feet at night that wakes you up from your sleep:     Blood clot in your veins:    Leg swelling:         Pulmonary    Oxygen at home:    Productive cough:     Wheezing:         Neurologic    Sudden weakness in arms or legs:     Sudden numbness in arms or legs:     Sudden onset of difficulty speaking or slurred speech:    Temporary loss of vision in one eye:       Problems with dizziness:         Gastrointestinal    Blood in stool:     Vomited blood:         Genitourinary    Burning when urinating:     Blood in urine:        Psychiatric    Major depression:         Hematologic    Bleeding problems:    Problems with blood clotting too easily:        Skin    Rashes or ulcers:        Constitutional    Fever or chills:      PHYSICAL EXAM:   Vitals:   07/09/19 0826  BP: (!) 148/80  Pulse: 67  Resp: 20  Temp: 97.6 F (36.4 C)  SpO2: 93%  Weight: 123 lb (55.8 kg)  Height: 5\' 4"  (1.626 m)    GENERAL: The patient is a well-nourished female, in no acute distress. The vital signs are documented above. CARDIAC: There is a regular rate and rhythm.  VASCULAR: I do not palpate pedal pulses PULMONARY: Non-labored respirations ABDOMEN: Soft and non-tender with normal pitched bowel sounds.  MUSCULOSKELETAL: There are no major deformities or cyanosis. NEUROLOGIC: No focal weakness or paresthesias are  detected. SKIN: There are no ulcers or rashes noted. PSYCHIATRIC: The patient has a normal affect.  STUDIES:   I have reviewed the following CTA: 1. Enlarging fusiform infrarenal abdominal aortic aneurysm with maximal diameter of 5.2 x 4.9 cm compared to 4.3 x 4.2 cm in December of 2019. Aortic aneurysm NOS (ICD10-I71.9); Aortic Atherosclerosis (ICD10-I70.0). 2. Stable to incrementally enlarged fusiform aneurysmal dilation of the visceral aorta with a maximal diameter of 3.9 cm compared to 3.8 cm in September of 2020. 3. Moderate bilateral renal artery stenoses secondary to calcified atherosclerotic plaque. Additionally, there is early branching on the right with beading of the division renal artery suggesting underlying fibromuscular dysplasia. 4. Extensive colonic diverticulosis without evidence of active diverticulitis. 5. Pelvic floor laxity. 6. Lower lumbar degenerative disc disease.  +-------+-----------+-----------+------------+------------+  ABI/TBIToday's ABIToday's TBIPrevious ABIPrevious TBI  +-------+-----------+-----------+------------+------------+  Right 0.71    0.42                  +-------+-----------+-----------+------------+------------+  Left  0.66    0.49                  +-------+-----------+-----------+------------+------------+    CAROTID: Right Carotid: Velocities in the right ICA are consistent with a 1-39%  stenosis.         Non-hemodynamically significant plaque <50% noted in the  CCA. The         ECA appears <50% stenosed. Right ICA was very tortuous  limiting         some views of the mid and distal segments.         Innominate artery demontrates non-hemodynamically  significant         mixed echogenicity plaque; 66 cm/s.   Left Carotid: Velocities in the left ICA are consistent with a 40-59%  stenosis.        Non-hemodynamically  significant plaque <50% noted in the  CCA. The        ECA appears <50% stenosed. Left ICA was very tortuous  limiting        some views of the mid and distal segments.   Vertebrals: Bilateral vertebral arteries demonstrate antegrade flow.  Subclavians: Normal flow hemodynamics were seen in bilateral subclavian  arteries MEDICAL ISSUES:   5.2 cm infrarenal abdominal aortic aneurysm: This has grown over a centimeter in approximately 2 years.  We discussed proceeding with repair despite her age.  We discussed the risks and benefits of surgery.  She is leaning towards getting it fixed due to concerns over rupture which she does think about frequently.  If she decides to proceed with repair, she will need clearance from Dr. Lovena Le with EP as well as cardiology.  She will need to be off of her Eliquis.  I have also encouraged her to speak with her primary care doctor, Dr. Deforest Hoyles before making a final decision.    Leia Alf, MD, FACS [facility] [phone] Pager [phone]

## 2019-07-23 ENCOUNTER — Other Ambulatory Visit: Payer: Self-pay

## 2019-07-23 ENCOUNTER — Ambulatory Visit (INDEPENDENT_AMBULATORY_CARE_PROVIDER_SITE_OTHER): Payer: Medicare Other | Admitting: Internal Medicine

## 2019-07-23 ENCOUNTER — Encounter: Payer: Self-pay | Admitting: Internal Medicine

## 2019-07-23 VITALS — BP 124/84 | HR 68 | Ht 62.0 in | Wt 120.0 lb

## 2019-07-23 DIAGNOSIS — Z950 Presence of cardiac pacemaker: Secondary | ICD-10-CM | POA: Diagnosis not present

## 2019-07-23 DIAGNOSIS — I442 Atrioventricular block, complete: Secondary | ICD-10-CM

## 2019-07-23 LAB — CUP PACEART INCLINIC DEVICE CHECK
Battery Remaining Longevity: 69 mo
Battery Voltage: 2.96 V
Brady Statistic AP VP Percent: 25.2 %
Brady Statistic AP VS Percent: 6.77 %
Brady Statistic AS VP Percent: 55.32 %
Brady Statistic AS VS Percent: 12.71 %
Brady Statistic RA Percent Paced: 31.97 %
Brady Statistic RV Percent Paced: 80.52 %
Date Time Interrogation Session: 20210607084711
Implantable Lead Implant Date: 20190528
Implantable Lead Implant Date: 20190528
Implantable Lead Location: 753859
Implantable Lead Location: 753860
Implantable Lead Model: 3830
Implantable Lead Model: 5076
Implantable Pulse Generator Implant Date: 20190528
Lead Channel Impedance Value: 418 Ohm
Lead Channel Impedance Value: 475 Ohm
Lead Channel Impedance Value: 494 Ohm
Lead Channel Impedance Value: 570 Ohm
Lead Channel Pacing Threshold Amplitude: 1 V
Lead Channel Pacing Threshold Amplitude: 3.75 V
Lead Channel Pacing Threshold Pulse Width: 0.8 ms
Lead Channel Pacing Threshold Pulse Width: 1 ms
Lead Channel Sensing Intrinsic Amplitude: 17.8 mV
Lead Channel Sensing Intrinsic Amplitude: 2.4 mV
Lead Channel Setting Pacing Amplitude: 2.5 V
Lead Channel Setting Pacing Amplitude: 4 V
Lead Channel Setting Pacing Pulse Width: 1 ms
Lead Channel Setting Sensing Sensitivity: 1.2 mV

## 2019-07-23 NOTE — Patient Instructions (Signed)
Medication Instructions:  Your physician recommends that you continue on your current medications as directed. Please refer to the Current Medication list given to you today.  *If you need a refill on your cardiac medications before your next appointment, please call your pharmacy*   Lab Work: None today If you have labs (blood work) drawn today and your tests are completely normal, you will receive your results only by: . MyChart Message (if you have MyChart) OR . A paper copy in the mail If you have any lab test that is abnormal or we need to change your treatment, we will call you to review the results.   Testing/Procedures: None today   Follow-Up: At [facility], you and your health needs are our priority.  As part of our continuing mission to provide you with exceptional heart care, we have created designated Provider Care Teams.  These Care Teams include your primary Cardiologist (physician) and Advanced Practice Providers (APPs -  Physician Assistants and Nurse Practitioners) who all work together to provide you with the care you need, when you need it.  We recommend signing up for the patient portal called "MyChart".  Sign up information is provided on this After Visit Summary.  MyChart is used to connect with patients for Virtual Visits (Telemedicine).  Patients are able to view lab/test results, encounter notes, upcoming appointments, etc.  Non-urgent messages can be sent to your provider as well.   To learn more about what you can do with MyChart, go to [email or web address].    Your next appointment:   12 month(s)  The format for your next appointment:   In Person  Provider:   Gregg Taylor, MD   Other Instructions None       Thank you for choosing [facility] !         

## 2019-07-23 NOTE — Progress Notes (Signed)
HPI Deborah Moses returns today for followup. She is a pleasant 86 yo woman with CHB, s/p PPM insertion, peripheral vascular disease and AAA. She has had worsening in her AAA and is pending endovascular graft repair. She is limited by claudication. She denies chest pain or sob. No edema. She has some elevated pacing thresholds.  No Known Allergies   Current Outpatient Medications  Medication Sig Dispense Refill   acetaminophen (TYLENOL) 500 MG tablet Take 500 mg by mouth every 6 (six) hours as needed for moderate pain.      alendronate (FOSAMAX) 70 MG tablet Take 70 mg by mouth once a week. Take with a full glass of water on an empty stomach.     amLODipine (NORVASC) 5 MG tablet Take 5 mg by mouth daily.     apixaban (ELIQUIS) 2.5 MG TABS tablet Take 1 tablet (2.5 mg total) by mouth 2 (two) times daily. 180 tablet 3   calcium-vitamin D (OSCAL WITH D) 500-200 MG-UNIT TABS tablet Take 1 tablet by mouth daily.      lisinopril (PRINIVIL,ZESTRIL) 20 MG tablet Take 40 mg by mouth daily.      lovastatin (MEVACOR) 20 MG tablet Take 20 mg by mouth at bedtime.     metoprolol succinate (TOPROL-XL) 25 MG 24 hr tablet Take 25 mg by mouth daily.     Omega-3 Fatty Acids (FISH OIL) 1000 MG CPDR Take 1,000 mg by mouth in the morning, at noon, and at bedtime.      No current facility-administered medications for this visit.     Past Medical History:  Diagnosis Date   AAA (abdominal aortic aneurysm) (HCC)    Heart block AV third degree (HCC)    HTN (hypertension)    Hyperlipidemia    Vertigo     ROS:   All systems reviewed and negative except as noted in the HPI.   Past Surgical History:  Procedure Laterality Date   PACEMAKER IMPLANT N/A 07/12/2017   Procedure: PACEMAKER IMPLANT;  Surgeon: Evans Lance, MD;  Location: [facility];  Service: Cardiovascular;  Laterality: N/A;     Family History  Problem Relation Age of Onset   Heart failure Father       Social History   Socioeconomic History   Marital status: Widowed    Spouse name: Not on file   Number of children: 4   Years of education: Not on file   Highest education level: Not on file  Occupational History   Not on file  Tobacco Use   Smoking status: Never Smoker   Smokeless tobacco: Never Used  Substance and Sexual Activity   Alcohol use: Never   Drug use: Never   Sexual activity: Not on file  Other Topics Concern   Not on file  Social History Narrative   Not on file   Social Determinants of Health   Financial Resource Strain:    Difficulty of Paying Living Expenses:   Food Insecurity:    Worried About [place] in the Last Year:    Arboriculturist in the Last Year:   Transportation Needs:    Film/video editor (Medical):    Lack of Transportation (Non-Medical):   Physical Activity:    Days of Exercise per Week:    Minutes of Exercise per Session:   Stress:    Feeling of Stress :   Social Connections:    Frequency of Communication with Friends and Family:  Frequency of Social Gatherings with Friends and Family:    Attends Religious Services:    Active Member of Clubs or Organizations:    Attends Engineer, structural:    Marital Status:   Intimate Partner Violence:    Fear of Current or Ex-Partner:    Emotionally Abused:    Physically Abused:    Sexually Abused:      BP 124/84    Pulse 68    Ht 5\' 2"  (1.575 m)    Wt 120 lb (54.4 kg)    SpO2 94%    BMI 21.95 kg/m   Physical Exam:  Well appearing NAD HEENT: Unremarkable Neck:  No JVD, no thyromegally Lymphatics:  No adenopathy Back:  No CVA tenderness Lungs:  Clear with no wheezes HEART:  Regular rate rhythm, no murmurs, no rubs, no clicks Abd:  soft, positive bowel sounds, no organomegally, no rebound, no guarding Ext:  2 plus pulses, no edema, no cyanosis, no clubbing Skin:  No rashes no nodules Neuro:  CN II through XII intact,  motor grossly intact  EKG - nsr with p synchronous ventricular pacing  DEVICE  Normal device function.  See PaceArt for details.   Assess/Plan: 1. CHB - she is asymptomatic, s/p PPM insertion. 2. PPM - her atrial threshold is elevated but she is not using her atrial lead. She will undergo watchful waiting.  3. HTN - her bp is well controlled.  4. Preoperative eval - she may proceed with surgery. She is low risk. She can hold Eliquis 2-3 days prior to surgery.  .D.

## 2019-07-25 NOTE — Addendum Note (Signed)
Addended by: Kerney Elbe on: 07/25/2019 09:17 AM   Modules accepted: Orders

## 2019-07-26 ENCOUNTER — Ambulatory Visit (INDEPENDENT_AMBULATORY_CARE_PROVIDER_SITE_OTHER): Payer: Medicare Other | Admitting: *Deleted

## 2019-07-26 DIAGNOSIS — I442 Atrioventricular block, complete: Secondary | ICD-10-CM | POA: Diagnosis not present

## 2019-07-26 LAB — CUP PACEART REMOTE DEVICE CHECK
Battery Remaining Longevity: 65 mo
Battery Voltage: 2.97 V
Brady Statistic AP VP Percent: 13.62 %
Brady Statistic AP VS Percent: 0.17 %
Brady Statistic AS VP Percent: 85.95 %
Brady Statistic AS VS Percent: 0.25 %
Brady Statistic RA Percent Paced: 13.82 %
Brady Statistic RV Percent Paced: 99.57 %
Date Time Interrogation Session: 20210610030918
Implantable Lead Implant Date: 20190528
Implantable Lead Implant Date: 20190528
Implantable Lead Location: 753859
Implantable Lead Location: 753860
Implantable Lead Model: 3830
Implantable Lead Model: 5076
Implantable Pulse Generator Implant Date: 20190528
Lead Channel Impedance Value: 361 Ohm
Lead Channel Impedance Value: 399 Ohm
Lead Channel Impedance Value: 399 Ohm
Lead Channel Impedance Value: 608 Ohm
Lead Channel Pacing Threshold Amplitude: 1.375 V
Lead Channel Pacing Threshold Amplitude: 1.375 V
Lead Channel Pacing Threshold Pulse Width: 0.4 ms
Lead Channel Pacing Threshold Pulse Width: 0.4 ms
Lead Channel Sensing Intrinsic Amplitude: 16.875 mV
Lead Channel Sensing Intrinsic Amplitude: 16.875 mV
Lead Channel Sensing Intrinsic Amplitude: 3 mV
Lead Channel Sensing Intrinsic Amplitude: 3 mV
Lead Channel Setting Pacing Amplitude: 2.5 V
Lead Channel Setting Pacing Amplitude: 4 V
Lead Channel Setting Pacing Pulse Width: 1 ms
Lead Channel Setting Sensing Sensitivity: 1.2 mV

## 2019-07-27 NOTE — Pre-Procedure Instructions (Signed)
[address], [place] [address] Phone: [phone] Fax: [place], [address] [place] [address] Phone: [phone] Fax: [phone]      Your procedure is scheduled on Wednesday June 16th.  Report to [facility] Main Entrance "A" at 0830 A.M., and check in at the Admitting office.  Call this number if you have problems the morning of surgery:  [phone]  Call [phone] if you have any questions prior to your surgery date Monday-Friday 8am-4pm    Remember:  Do not eat or drink after midnight the night before your surgery    Take these medicines the morning of surgery with A SIP OF WATER  amLODipine (NORVASC) 5 MG  lisinopril (PRINIVIL,ZESTRIL) 20 MG  metoprolol succinate (TOPROL-XL) 25 MG     acetaminophen (TYLENOL) 500 MG as needed for pain    As of today, STOP taking any Aspirin (unless otherwise instructed by your surgeon) and Aspirin containing products, Aleve, Naproxen, Ibuprofen, Motrin, Advil, Goody's, BC's, all herbal medications, fish oil, and all vitamins.                      Do not wear jewelry, make up, or nail polish            Do not wear lotions, powders, perfumes, or deodorant.            Do not shave 48 hours prior to surgery.              Do not bring valuables to the hospital.            [facility] is not responsible for any belongings or valuables.  Do NOT Smoke (Tobacco/Vapping) or drink Alcohol 24 hours prior to your procedure If you use a CPAP at night, you may bring all equipment for your overnight stay.   Contacts, glasses, dentures or bridgework may not be worn into surgery.      For patients admitted to the hospital, discharge time will be determined by your treatment team.   Patients discharged the day of surgery will not be allowed to drive home, and someone needs to stay with them for 24  hours.    Special instructions:   [place]- Preparing For Surgery  Before surgery, you can play an important role. Because skin is not sterile, your skin needs to be as free of germs as possible. You can reduce the number of germs on your skin by washing with CHG (chlorahexidine gluconate) Soap before surgery.  CHG is an antiseptic cleaner which kills germs and bonds with the skin to continue killing germs even after washing.    Oral Hygiene is also important to reduce your risk of infection.  Remember - BRUSH YOUR TEETH THE MORNING OF SURGERY WITH YOUR REGULAR TOOTHPASTE  Please do not use if you have an allergy to CHG or antibacterial soaps. If your skin becomes reddened/irritated stop using the CHG.  Do not shave (including legs and underarms) for at least 48 hours prior to first CHG shower. It is OK to shave your face.  Please follow these instructions carefully.   1. Shower the NIGHT BEFORE SURGERY and the MORNING OF SURGERY with CHG Soap.   2. If you chose to wash your hair, wash your hair first as usual with your normal shampoo.  3. After  you shampoo, rinse your hair and body thoroughly to remove the shampoo.  4. Use CHG as you would any other liquid soap. You can apply CHG directly to the skin and wash gently with a scrungie or a clean washcloth.   5. Apply the CHG Soap to your body ONLY FROM THE NECK DOWN.  Do not use on open wounds or open sores. Avoid contact with your eyes, ears, mouth and genitals (private parts). Wash Face and genitals (private parts)  with your normal soap.   6. Wash thoroughly, paying special attention to the area where your surgery will be performed.  7. Thoroughly rinse your body with warm water from the neck down.  8. DO NOT shower/wash with your normal soap after using and rinsing off the CHG Soap.  9. Pat yourself dry with a CLEAN TOWEL.  10. Wear CLEAN PAJAMAS to bed the night before surgery, wear comfortable clothes the morning of  surgery  11. Place CLEAN SHEETS on your bed the night of your first shower and DO NOT SLEEP WITH PETS.   Day of Surgery:   Do not apply any deodorants/lotions.  Please wear clean clothes to the hospital/surgery center.   Remember to brush your teeth WITH YOUR REGULAR TOOTHPASTE.   Please read over the following fact sheets that you were given.

## 2019-07-27 NOTE — Progress Notes (Signed)
Remote pacemaker transmission.   

## 2019-07-30 ENCOUNTER — Other Ambulatory Visit (HOSPITAL_COMMUNITY)
Admission: RE | Admit: 2019-07-30 | Discharge: 2019-07-30 | Disposition: A | Discharge status: Home / Self Care | Payer: Medicare Other | Source: Ambulatory Visit | Attending: Surgery | Admitting: Surgery

## 2019-07-30 ENCOUNTER — Other Ambulatory Visit: Payer: Self-pay

## 2019-07-30 ENCOUNTER — Encounter (HOSPITAL_COMMUNITY)
Admission: RE | Admit: 2019-07-30 | Discharge: 2019-07-30 | Disposition: A | Discharge status: Home / Self Care | Payer: Medicare Other | Source: Ambulatory Visit | Attending: Surgery | Admitting: Surgery

## 2019-07-30 ENCOUNTER — Encounter (HOSPITAL_COMMUNITY): Payer: Self-pay

## 2019-07-30 ENCOUNTER — Encounter: Payer: Self-pay | Admitting: Surgery

## 2019-07-30 DIAGNOSIS — Z20822 Contact with and (suspected) exposure to covid-19: Secondary | ICD-10-CM | POA: Diagnosis not present

## 2019-07-30 DIAGNOSIS — Z01812 Encounter for preprocedural laboratory examination: Secondary | ICD-10-CM | POA: Insufficient documentation

## 2019-07-30 DIAGNOSIS — Z8249 Family history of ischemic heart disease and other diseases of the circulatory system: Secondary | ICD-10-CM | POA: Diagnosis not present

## 2019-07-30 DIAGNOSIS — Z950 Presence of cardiac pacemaker: Secondary | ICD-10-CM | POA: Diagnosis not present

## 2019-07-30 DIAGNOSIS — J90 Pleural effusion, not elsewhere classified: Secondary | ICD-10-CM | POA: Diagnosis not present

## 2019-07-30 DIAGNOSIS — I714 Abdominal aortic aneurysm, without rupture: Secondary | ICD-10-CM | POA: Diagnosis not present

## 2019-07-30 DIAGNOSIS — Z79899 Other long term (current) drug therapy: Secondary | ICD-10-CM | POA: Diagnosis not present

## 2019-07-30 DIAGNOSIS — R54 Age-related physical debility: Secondary | ICD-10-CM | POA: Diagnosis not present

## 2019-07-30 DIAGNOSIS — I10 Essential (primary) hypertension: Secondary | ICD-10-CM | POA: Diagnosis not present

## 2019-07-30 DIAGNOSIS — E785 Hyperlipidemia, unspecified: Secondary | ICD-10-CM | POA: Diagnosis not present

## 2019-07-30 DIAGNOSIS — E7800 Pure hypercholesterolemia, unspecified: Secondary | ICD-10-CM | POA: Diagnosis not present

## 2019-07-30 DIAGNOSIS — I442 Atrioventricular block, complete: Secondary | ICD-10-CM | POA: Diagnosis not present

## 2019-07-30 LAB — CBC
HCT: 41.7 % (ref 36.0–46.0)
Hemoglobin: 13.3 g/dL (ref 12.0–15.0)
MCH: 31.5 pg (ref 26.0–34.0)
MCHC: 31.9 g/dL (ref 30.0–36.0)
MCV: 98.8 fL (ref 80.0–100.0)
Platelets: 243 10*3/uL (ref 150–400)
RBC: 4.22 MIL/uL (ref 3.87–5.11)
RDW: 11.4 % — ABNORMAL LOW (ref 11.5–15.5)
WBC: 8.5 10*3/uL (ref 4.0–10.5)
nRBC: 0 % (ref 0.0–0.2)

## 2019-07-30 LAB — COMPREHENSIVE METABOLIC PANEL
ALT: 19 U/L (ref 0–44)
AST: 25 U/L (ref 15–41)
Albumin: 4 g/dL (ref 3.5–5.0)
Alkaline Phosphatase: 47 U/L (ref 38–126)
Anion gap: 11 (ref 5–15)
BUN: 25 mg/dL — ABNORMAL HIGH (ref 8–23)
CO2: 25 mmol/L (ref 22–32)
Calcium: 10.4 mg/dL — ABNORMAL HIGH (ref 8.9–10.3)
Chloride: 103 mmol/L (ref 98–111)
Creatinine, Ser: 1.23 mg/dL — ABNORMAL HIGH (ref 0.44–1.00)
GFR calc Af Amer: 46 mL/min — ABNORMAL LOW (ref >60)
GFR calc non Af Amer: 40 mL/min — ABNORMAL LOW (ref >60)
Glucose, Bld: 100 mg/dL — ABNORMAL HIGH (ref 70–99)
Potassium: 4.6 mmol/L (ref 3.5–5.1)
Sodium: 139 mmol/L (ref 135–145)
Total Bilirubin: 0.4 mg/dL (ref 0.3–1.2)
Total Protein: 7.6 g/dL (ref 6.5–8.1)

## 2019-07-30 LAB — URINALYSIS, ROUTINE W REFLEX MICROSCOPIC
Bilirubin Urine: NEGATIVE
Glucose, UA: NEGATIVE mg/dL
Ketones, ur: NEGATIVE mg/dL
Leukocytes,Ua: NEGATIVE
Nitrite: POSITIVE — AB
Protein, ur: NEGATIVE mg/dL
Specific Gravity, Urine: 1.023 (ref 1.005–1.030)
pH: 5 (ref 5.0–8.0)

## 2019-07-30 LAB — SARS CORONAVIRUS 2 (TAT 6-24 HRS): SARS Coronavirus 2: NEGATIVE

## 2019-07-30 LAB — APTT: aPTT: 33 seconds (ref 24–36)

## 2019-07-30 LAB — TYPE AND SCREEN
ABO/RH(D): A POS
Antibody Screen: NEGATIVE

## 2019-07-30 LAB — PROTIME-INR
INR: 1 (ref 0.8–1.2)
Prothrombin Time: 13.1 seconds (ref 11.4–15.2)

## 2019-07-30 LAB — SURGICAL PCR SCREEN
MRSA, PCR: NEGATIVE
Staphylococcus aureus: NEGATIVE

## 2019-07-30 LAB — ABO/RH: ABO/RH(D): A POS

## 2019-07-30 NOTE — Progress Notes (Signed)
Primary Care Physician: Toma Deiters, MD Primary Cardiologist: Dr. Titus Mould Electrophysiologist: Dr. Ladona Ridgel   PPM/ICD - PPM  Device Orders - requested 07/30/19  Rep Notified - Jana Half emailed. Cyndia Diver, RN cc'd   Chest x-ray - 04/26/19 EKG - 07/23/19  Stress Test - pt deneis ECHO - 07/12/17 Cardiac Cath - pt denies   Blood Thinner Instructions: Follow your surgeon's instructions on when to stop Eliquis.  If no instructions were given by your surgeon then you will need to call the office to get those instructions.   Per pt last dose Eliquis 07/28/19  Aspirin Instructions:n/a   COVID TEST- 07/30/19  Coronavirus Screening  Have you experienced the following symptoms:  Cough yes/no: No Fever (>100.6F)  yes/no: No Runny nose yes/no: No Sore throat yes/no: No Difficulty breathing/shortness of breath  yes/no: No  Have you or a family member traveled in the last 14 days and where? yes/no: No   If the patient indicates "YES" to the above questions, their PAT will be rescheduled to limit the exposure to others and, the surgeon will be notified. THE PATIENT WILL NEED TO BE ASYMPTOMATIC FOR 14 DAYS.   If the patient is not experiencing any of these symptoms, the PAT nurse will instruct them to NOT bring anyone with them to their appointment since they may have these symptoms or traveled as well.   Please remind your patients and families that hospital visitation restrictions are in effect and the importance of the restrictions.     Anesthesia review: n/a  Patient denies shortness of breath, fever, cough and chest pain at PAT appointment   All instructions explained to the patient, with a verbal understanding of the material. Patient agrees to go over the instructions while at home for a better understanding. Patient also instructed to self quarantine after being tested for COVID-19. The opportunity to ask questions was provided.

## 2019-07-30 NOTE — Progress Notes (Signed)
Your procedure is scheduled on Wednesday June 16.  Report to [facility] Main Entrance "A" at 08:30 A.M., and check in at the Admitting office.  Call this number if you have problems the morning of surgery: [phone]  Call [phone] if you have any questions prior to your surgery date Monday-Friday 8am-4pm   Remember: Do not eat or drink after midnight the night before your surgery  Take these medicines the morning of surgery with A SIP OF WATER: alendronate (FOSAMAX) amLODipine (NORVASC)  metoprolol succinate (TOPROL-XL)  IF NEEDED: acetaminophen (Tylenol)   Follow your surgeon's instructions on when to stop apixaban (ELIQUIS).  If no instructions were given by your surgeon then you will need to call the office to get those instructions.      As of today, STOP taking any Aspirin (unless otherwise instructed by your surgeon), and Aspirin containing products, Aleve, Naproxen, Ibuprofen, Motrin, Advil, Goody's, BC's, all herbal medications, fish oil, and all vitamins.    The Morning of Surgery  Do not wear jewelry, make-up or nail polish.  Do not wear lotions, powders, or perfumes, or deodorant  Do not shave 48 hours prior to surgery.    Do not bring valuables to the hospital.  [facility] is not responsible for any belongings or valuables.  If you are a smoker, DO NOT Smoke 24 hours prior to surgery  If you wear a CPAP at night please bring your mask the morning of surgery   Remember that you must have someone to transport you home after your surgery, and remain with you for 24 hours if you are discharged the same day.   Please bring cases for contacts, glasses, hearing aids, dentures or bridgework because it cannot be worn into surgery.    Leave your suitcase in the car.  After surgery it may be brought to your room.  For patients admitted to the hospital, discharge time will be determined by your treatment team.  Patients discharged the day of surgery will not  be allowed to drive home.    Special instructions:   [place]- Preparing For Surgery  Before surgery, you can play an important role. Because skin is not sterile, your skin needs to be as free of germs as possible. You can reduce the number of germs on your skin by washing with CHG (chlorahexidine gluconate) Soap before surgery.  CHG is an antiseptic cleaner which kills germs and bonds with the skin to continue killing germs even after washing.    Oral Hygiene is also important to reduce your risk of infection.  Remember - BRUSH YOUR TEETH THE MORNING OF SURGERY WITH YOUR REGULAR TOOTHPASTE  Please do not use if you have an allergy to CHG or antibacterial soaps. If your skin becomes reddened/irritated stop using the CHG.  Do not shave (including legs and underarms) for at least 48 hours prior to first CHG shower. It is OK to shave your face.  Please follow these instructions carefully.   1. Shower the NIGHT BEFORE SURGERY and the MORNING OF SURGERY with CHG Soap.   2. If you chose to wash your hair and body, wash as usual with your normal shampoo and body-wash/soap.  3. Rinse your hair and body thoroughly to remove the shampoo and soap.  4. Apply CHG directly to the skin (ONLY FROM THE NECK DOWN) and wash gently with a scrungie or a clean washcloth.   5. Do not use on open wounds or open sores. Avoid contact with  your eyes, ears, mouth and genitals (private parts). Wash Face and genitals (private parts)  with your normal soap.   6. Wash thoroughly, paying special attention to the area where your surgery will be performed.  7. Thoroughly rinse your body with warm water from the neck down.  8. DO NOT shower/wash with your normal soap after using and rinsing off the CHG Soap.  9. Pat yourself dry with a CLEAN TOWEL.  10. Wear CLEAN PAJAMAS to bed the night before surgery  11. Place CLEAN SHEETS on your bed the night of your first shower and DO NOT SLEEP WITH PETS.  12. Wear  comfortable clothes the morning of surgery.     Day of Surgery:  Please shower the morning of surgery with the CHG soap Do not apply any deodorants/lotions. Please wear clean clothes to the hospital/surgery center.   Remember to brush your teeth WITH YOUR REGULAR TOOTHPASTE.   Please read over the following fact sheets that you were given.

## 2019-07-30 NOTE — Progress Notes (Addendum)
PERIOPERATIVE PRESCRIPTION FOR IMPLANTED CARDIAC DEVICE PROGRAMMING  Patient Information: Name:  Deborah Moses  DOB:  03/08/1931  MRN:  [id number]    Planned Procedure: Endovascular Stent Repair  Surgeon: Dr. Coral Else  Date of Procedure: 08/01/19 @ 1030  Cautery will be used.  Position during surgery:   Please send documentation back to:  [facility] (Fax # [phone]  Device Information:  Clinic EP Physician:  Dr. Lewayne Bunting, M.D.   Device Type:  Pacemaker Manufacturer and Phone #:  [employer]tronic: [id number] Pacemaker Dependent?:  Yes.   Date of Last Device Check:  05/17/2019 Normal Device Function?:  Yes.    Electrophysiologist's Recommendations:   Have magnet available.  Provide continuous ECG monitoring when magnet is used or reprogramming is to be performed.   Procedure will likely interfere with device function.  Device should be programmed:  Asynchronous pacing during procedure and returned to normal programming after procedure  Per Device Clinic Standing Orders, Lenor Coffin, RN  2:17 PM 07/30/2019

## 2019-08-01 ENCOUNTER — Inpatient Hospital Stay (HOSPITAL_COMMUNITY): Payer: Medicare Other

## 2019-08-01 ENCOUNTER — Inpatient Hospital Stay (HOSPITAL_COMMUNITY)
Admission: RE | Admit: 2019-08-01 | Discharge: 2019-08-02 | DRG: 269 | Disposition: A | Discharge status: Home / Self Care | Payer: Medicare Other | Attending: Surgery | Admitting: Surgery

## 2019-08-01 ENCOUNTER — Other Ambulatory Visit: Payer: Self-pay

## 2019-08-01 ENCOUNTER — Encounter (HOSPITAL_COMMUNITY): Payer: Self-pay | Admitting: Surgery

## 2019-08-01 ENCOUNTER — Encounter (HOSPITAL_COMMUNITY)
Admission: RE | Disposition: A | Discharge status: Home / Self Care | Payer: Self-pay | Source: Home / Self Care | Attending: Surgery

## 2019-08-01 DIAGNOSIS — E785 Hyperlipidemia, unspecified: Secondary | ICD-10-CM | POA: Diagnosis present

## 2019-08-01 DIAGNOSIS — Z20822 Contact with and (suspected) exposure to covid-19: Secondary | ICD-10-CM | POA: Diagnosis present

## 2019-08-01 DIAGNOSIS — Z950 Presence of cardiac pacemaker: Secondary | ICD-10-CM | POA: Diagnosis not present

## 2019-08-01 DIAGNOSIS — R54 Age-related physical debility: Secondary | ICD-10-CM | POA: Diagnosis present

## 2019-08-01 DIAGNOSIS — I714 Abdominal aortic aneurysm, without rupture, unspecified: Secondary | ICD-10-CM | POA: Diagnosis present

## 2019-08-01 DIAGNOSIS — I10 Essential (primary) hypertension: Secondary | ICD-10-CM | POA: Diagnosis present

## 2019-08-01 DIAGNOSIS — Z79899 Other long term (current) drug therapy: Secondary | ICD-10-CM | POA: Diagnosis not present

## 2019-08-01 DIAGNOSIS — Z8679 Personal history of other diseases of the circulatory system: Secondary | ICD-10-CM

## 2019-08-01 DIAGNOSIS — E7800 Pure hypercholesterolemia, unspecified: Secondary | ICD-10-CM | POA: Diagnosis present

## 2019-08-01 DIAGNOSIS — I442 Atrioventricular block, complete: Secondary | ICD-10-CM | POA: Diagnosis present

## 2019-08-01 DIAGNOSIS — Z8249 Family history of ischemic heart disease and other diseases of the circulatory system: Secondary | ICD-10-CM | POA: Diagnosis not present

## 2019-08-01 HISTORY — PX: ABDOMINAL AORTIC ENDOVASCULAR STENT GRAFT: SHX5707

## 2019-08-01 HISTORY — PX: ULTRASOUND GUIDANCE FOR VASCULAR ACCESS: SHX6516

## 2019-08-01 LAB — MAGNESIUM: Magnesium: 1.6 mg/dL — ABNORMAL LOW (ref 1.7–2.4)

## 2019-08-01 LAB — CBC
HCT: 35.5 % — ABNORMAL LOW (ref 36.0–46.0)
Hemoglobin: 11.4 g/dL — ABNORMAL LOW (ref 12.0–15.0)
MCH: 31.4 pg (ref 26.0–34.0)
MCHC: 32.1 g/dL (ref 30.0–36.0)
MCV: 97.8 fL (ref 80.0–100.0)
Platelets: 160 10*3/uL (ref 150–400)
RBC: 3.63 MIL/uL — ABNORMAL LOW (ref 3.87–5.11)
RDW: 11.5 % (ref 11.5–15.5)
WBC: 10.3 10*3/uL (ref 4.0–10.5)
nRBC: 0 % (ref 0.0–0.2)

## 2019-08-01 LAB — PROTIME-INR
INR: 1.2 (ref 0.8–1.2)
Prothrombin Time: 14.2 seconds (ref 11.4–15.2)

## 2019-08-01 LAB — BASIC METABOLIC PANEL
Anion gap: 11 (ref 5–15)
BUN: 22 mg/dL (ref 8–23)
CO2: 23 mmol/L (ref 22–32)
Calcium: 8.8 mg/dL — ABNORMAL LOW (ref 8.9–10.3)
Chloride: 105 mmol/L (ref 98–111)
Creatinine, Ser: 1.21 mg/dL — ABNORMAL HIGH (ref 0.44–1.00)
GFR calc Af Amer: 47 mL/min — ABNORMAL LOW (ref >60)
GFR calc non Af Amer: 40 mL/min — ABNORMAL LOW (ref >60)
Glucose, Bld: 113 mg/dL — ABNORMAL HIGH (ref 70–99)
Potassium: 4.6 mmol/L (ref 3.5–5.1)
Sodium: 139 mmol/L (ref 135–145)

## 2019-08-01 LAB — APTT: aPTT: 37 seconds — ABNORMAL HIGH (ref 24–36)

## 2019-08-01 SURGERY — INSERTION, ENDOVASCULAR STENT GRAFT, AORTA, ABDOMINAL
Anesthesia: General | Site: Groin

## 2019-08-01 MED ORDER — CEFAZOLIN SODIUM-DEXTROSE 2-4 GM/100ML-% IV SOLN
2.0000 g | INTRAVENOUS | Status: AC
  Administered 2019-08-01: 2 g via INTRAVENOUS

## 2019-08-01 MED ORDER — DOCUSATE SODIUM 100 MG PO CAPS
100.0000 mg | ORAL_CAPSULE | Freq: Every day | ORAL | Status: DC
  Administered 2019-08-02: 100 mg via ORAL
  Filled 2019-08-01: qty 1

## 2019-08-01 MED ORDER — SODIUM CHLORIDE 0.9 % IV SOLN
INTRAVENOUS | Status: DC | PRN
  Administered 2019-08-01: 500 mL

## 2019-08-01 MED ORDER — ROCURONIUM BROMIDE 10 MG/ML (PF) SYRINGE
PREFILLED_SYRINGE | INTRAVENOUS | Status: AC
  Filled 2019-08-01: qty 10

## 2019-08-01 MED ORDER — VASOPRESSIN 20 UNIT/ML IV SOLN
INTRAVENOUS | Status: AC
  Filled 2019-08-01: qty 1

## 2019-08-01 MED ORDER — BISACODYL 10 MG RE SUPP: 10.0000 mg | Freq: Every day | RECTAL | Status: DC | PRN

## 2019-08-01 MED ORDER — AMLODIPINE BESYLATE 5 MG PO TABS: 5.0000 mg | ORAL_TABLET | Freq: Every day | ORAL | Status: DC

## 2019-08-01 MED ORDER — 0.9 % SODIUM CHLORIDE (POUR BTL) OPTIME
TOPICAL | Status: DC | PRN
  Administered 2019-08-01: 1000 mL

## 2019-08-01 MED ORDER — ONDANSETRON HCL 4 MG/2ML IJ SOLN: 4.0000 mg | Freq: Four times a day (QID) | INTRAMUSCULAR | Status: DC | PRN

## 2019-08-01 MED ORDER — ROCURONIUM BROMIDE 100 MG/10ML IV SOLN
INTRAVENOUS | Status: DC | PRN
  Administered 2019-08-01: 60 mg via INTRAVENOUS

## 2019-08-01 MED ORDER — LACTATED RINGERS IV SOLN
INTRAVENOUS | Status: DC | PRN
Start: 2019-08-01 — End: 2019-08-01

## 2019-08-01 MED ORDER — ONDANSETRON HCL 4 MG/2ML IJ SOLN
INTRAMUSCULAR | Status: DC | PRN
  Administered 2019-08-01: 4 mg via INTRAVENOUS

## 2019-08-01 MED ORDER — DEXAMETHASONE SODIUM PHOSPHATE 10 MG/ML IJ SOLN
INTRAMUSCULAR | Status: AC
  Filled 2019-08-01: qty 1

## 2019-08-01 MED ORDER — SUGAMMADEX SODIUM 200 MG/2ML IV SOLN
INTRAVENOUS | Status: DC | PRN
Start: 2019-08-01 — End: 2019-08-01
  Administered 2019-08-01: 120 mg via INTRAVENOUS

## 2019-08-01 MED ORDER — PHENYLEPHRINE HCL (PRESSORS) 10 MG/ML IV SOLN
INTRAVENOUS | Status: DC | PRN
  Administered 2019-08-01 (×2): 40 ug via INTRAVENOUS
  Administered 2019-08-01: 120 ug via INTRAVENOUS

## 2019-08-01 MED ORDER — SODIUM CHLORIDE 0.9 % IV SOLN: 500.0000 mL | Freq: Once | INTRAVENOUS | Status: DC | PRN

## 2019-08-01 MED ORDER — CEFAZOLIN SODIUM-DEXTROSE 2-4 GM/100ML-% IV SOLN
INTRAVENOUS | Status: AC
  Filled 2019-08-01: qty 100

## 2019-08-01 MED ORDER — METOPROLOL TARTRATE 5 MG/5ML IV SOLN: 2.0000 mg | INTRAVENOUS | Status: DC | PRN

## 2019-08-01 MED ORDER — LACTATED RINGERS IV SOLN: INTRAVENOUS | Status: DC

## 2019-08-01 MED ORDER — GUAIFENESIN-DM 100-10 MG/5ML PO SYRP: 15.0000 mL | ORAL_SOLUTION | ORAL | Status: DC | PRN

## 2019-08-01 MED ORDER — FENTANYL CITRATE (PF) 100 MCG/2ML IJ SOLN
INTRAMUSCULAR | Status: DC | PRN
  Administered 2019-08-01: 50 ug via INTRAVENOUS
  Administered 2019-08-01: 100 ug via INTRAVENOUS

## 2019-08-01 MED ORDER — HYDRALAZINE HCL 20 MG/ML IJ SOLN: 5.0000 mg | INTRAMUSCULAR | Status: DC | PRN

## 2019-08-01 MED ORDER — DEXAMETHASONE SODIUM PHOSPHATE 4 MG/ML IJ SOLN
INTRAMUSCULAR | Status: DC | PRN
  Administered 2019-08-01: 4 mg via INTRAVENOUS

## 2019-08-01 MED ORDER — ALUM & MAG HYDROXIDE-SIMETH 200-200-20 MG/5ML PO SUSP: 15.0000 mL | ORAL | Status: DC | PRN

## 2019-08-01 MED ORDER — PROTAMINE SULFATE 10 MG/ML IV SOLN
INTRAVENOUS | Status: AC
  Filled 2019-08-01: qty 25

## 2019-08-01 MED ORDER — SODIUM CHLORIDE 0.9 % IV SOLN
INTRAVENOUS | Status: AC
  Filled 2019-08-01: qty 1.2

## 2019-08-01 MED ORDER — PHENYLEPHRINE 40 MCG/ML (10ML) SYRINGE FOR IV PUSH (FOR BLOOD PRESSURE SUPPORT)
PREFILLED_SYRINGE | INTRAVENOUS | Status: AC
  Filled 2019-08-01: qty 10

## 2019-08-01 MED ORDER — LIDOCAINE HCL (CARDIAC) PF 100 MG/5ML IV SOSY
PREFILLED_SYRINGE | INTRAVENOUS | Status: DC | PRN
  Administered 2019-08-01: 100 mg via INTRAVENOUS

## 2019-08-01 MED ORDER — ONDANSETRON HCL 4 MG/2ML IJ SOLN
INTRAMUSCULAR | Status: AC
  Filled 2019-08-01: qty 2

## 2019-08-01 MED ORDER — CHLORHEXIDINE GLUCONATE 0.12 % MT SOLN: 15.0000 mL | Freq: Once | OROMUCOSAL | Status: AC

## 2019-08-01 MED ORDER — PROPOFOL 10 MG/ML IV BOLUS
INTRAVENOUS | Status: DC | PRN
  Administered 2019-08-01: 90 mg via INTRAVENOUS

## 2019-08-01 MED ORDER — CHLORHEXIDINE GLUCONATE 0.12 % MT SOLN
OROMUCOSAL | Status: AC
  Administered 2019-08-01: 15 mL via OROMUCOSAL
  Filled 2019-08-01: qty 15

## 2019-08-01 MED ORDER — LABETALOL HCL 5 MG/ML IV SOLN: 10.0000 mg | INTRAVENOUS | Status: DC | PRN

## 2019-08-01 MED ORDER — AMLODIPINE BESYLATE 5 MG PO TABS
5.0000 mg | ORAL_TABLET | Freq: Every day | ORAL | Status: DC
  Administered 2019-08-02: 5 mg via ORAL
  Filled 2019-08-01: qty 1

## 2019-08-01 MED ORDER — POLYETHYLENE GLYCOL 3350 17 G PO PACK: 17.0000 g | PACK | Freq: Every day | ORAL | Status: DC | PRN

## 2019-08-01 MED ORDER — CEFAZOLIN SODIUM-DEXTROSE 1-4 GM/50ML-% IV SOLN
1.0000 g | Freq: Two times a day (BID) | INTRAVENOUS | Status: AC
  Administered 2019-08-01 – 2019-08-02 (×2): 1 g via INTRAVENOUS
  Filled 2019-08-01 (×2): qty 50

## 2019-08-01 MED ORDER — PHENYLEPHRINE HCL-NACL 10-0.9 MG/250ML-% IV SOLN
INTRAVENOUS | Status: DC | PRN
  Administered 2019-08-01: 50 ug/min via INTRAVENOUS

## 2019-08-01 MED ORDER — OXYCODONE-ACETAMINOPHEN 5-325 MG PO TABS: 1.0000 | ORAL_TABLET | Freq: Four times a day (QID) | ORAL | Status: DC | PRN

## 2019-08-01 MED ORDER — IODIXANOL 320 MG/ML IV SOLN
INTRAVENOUS | Status: DC | PRN
  Administered 2019-08-01: 45 mL via INTRA_ARTERIAL

## 2019-08-01 MED ORDER — ACETAMINOPHEN 650 MG RE SUPP: 325.0000 mg | RECTAL | Status: DC | PRN

## 2019-08-01 MED ORDER — CHLORHEXIDINE GLUCONATE CLOTH 2 % EX PADS: 6.0000 | MEDICATED_PAD | Freq: Once | CUTANEOUS | Status: DC

## 2019-08-01 MED ORDER — PHENOL 1.4 % MT LIQD: 1.0000 | OROMUCOSAL | Status: DC | PRN

## 2019-08-01 MED ORDER — FENTANYL CITRATE (PF) 250 MCG/5ML IJ SOLN
INTRAMUSCULAR | Status: AC
  Filled 2019-08-01: qty 5

## 2019-08-01 MED ORDER — SODIUM CHLORIDE 0.9 % IV SOLN: INTRAVENOUS | Status: DC

## 2019-08-01 MED ORDER — HEPARIN SODIUM (PORCINE) 1000 UNIT/ML IJ SOLN
INTRAMUSCULAR | Status: DC | PRN
Start: 2019-08-01 — End: 2019-08-01
  Administered 2019-08-01: 1000 [IU] via INTRAVENOUS
  Administered 2019-08-01: 2000 [IU] via INTRAVENOUS
  Administered 2019-08-01: 6000 [IU] via INTRAVENOUS

## 2019-08-01 MED ORDER — MAGNESIUM SULFATE 2 GM/50ML IV SOLN: 2.0000 g | Freq: Every day | INTRAVENOUS | Status: DC | PRN

## 2019-08-01 MED ORDER — ORAL CARE MOUTH RINSE: 15.0000 mL | Freq: Once | OROMUCOSAL | Status: AC

## 2019-08-01 MED ORDER — POTASSIUM CHLORIDE CRYS ER 20 MEQ PO TBCR: 20.0000 meq | EXTENDED_RELEASE_TABLET | Freq: Every day | ORAL | Status: DC | PRN

## 2019-08-01 MED ORDER — ESMOLOL HCL 100 MG/10ML IV SOLN
INTRAVENOUS | Status: AC
  Filled 2019-08-01: qty 10

## 2019-08-01 MED ORDER — PROTAMINE SULFATE 10 MG/ML IV SOLN
INTRAVENOUS | Status: DC | PRN
  Administered 2019-08-01: 10 mg via INTRAVENOUS
  Administered 2019-08-01: 40 mg via INTRAVENOUS

## 2019-08-01 MED ORDER — PROPOFOL 10 MG/ML IV BOLUS
INTRAVENOUS | Status: AC
  Filled 2019-08-01: qty 20

## 2019-08-01 MED ORDER — METOPROLOL SUCCINATE ER 25 MG PO TB24
25.0000 mg | ORAL_TABLET | Freq: Every day | ORAL | Status: DC
  Administered 2019-08-02: 25 mg via ORAL
  Filled 2019-08-01: qty 1

## 2019-08-01 MED ORDER — PRAVASTATIN SODIUM 10 MG PO TABS
20.0000 mg | ORAL_TABLET | Freq: Every day | ORAL | Status: DC
  Administered 2019-08-01: 20 mg via ORAL

## 2019-08-01 MED ORDER — ACETAMINOPHEN 325 MG PO TABS
325.0000 mg | ORAL_TABLET | ORAL | Status: DC | PRN
  Administered 2019-08-02: 650 mg via ORAL
  Filled 2019-08-01: qty 2

## 2019-08-01 MED ORDER — PANTOPRAZOLE SODIUM 40 MG PO TBEC
40.0000 mg | DELAYED_RELEASE_TABLET | Freq: Every day | ORAL | Status: DC
  Administered 2019-08-01 – 2019-08-02 (×2): 40 mg via ORAL
  Filled 2019-08-01 (×2): qty 1

## 2019-08-01 MED ORDER — HYDROMORPHONE HCL 1 MG/ML IJ SOLN: 0.5000 mg | INTRAMUSCULAR | Status: DC | PRN

## 2019-08-01 SURGICAL SUPPLY — 70 items
BAG SNAP BAND KOVER 36X36 (MISCELLANEOUS) ×3 IMPLANT
BLADE CLIPPER SURG (BLADE) ×3 IMPLANT
CANISTER SUCT 3000ML PPV (MISCELLANEOUS) ×3 IMPLANT
CATH ANGIO 5F BER2 65CM (CATHETERS) ×3 IMPLANT
CATH BEACON 5.038 65CM KMP-01 (CATHETERS) ×3 IMPLANT
CATH OMNI FLUSH .035X70CM (CATHETERS) ×6 IMPLANT
COVER WAND RF STERILE (DRAPES) ×3 IMPLANT
DERMABOND ADVANCED (GAUZE/BANDAGES/DRESSINGS) ×1
DERMABOND ADVANCED .7 DNX12 (GAUZE/BANDAGES/DRESSINGS) ×2 IMPLANT
DEVICE CLOSURE PERCLS PRGLD 6F (VASCULAR PRODUCTS) ×12 IMPLANT
DEVICE TORQUE H2O (MISCELLANEOUS) IMPLANT
DRAPE HALF SHEET 40X57 (DRAPES) ×3 IMPLANT
DRAPE ORTHO SPLIT 77X108 STRL (DRAPES) ×1
DRAPE SURG ORHT 6 SPLT 77X108 (DRAPES) ×2 IMPLANT
DRAPE ZERO GRAVITY STERILE (DRAPES) ×3 IMPLANT
DRSG TEGADERM 2-3/8X2-3/4 SM (GAUZE/BANDAGES/DRESSINGS) ×6 IMPLANT
DRYSEAL FLEXSHEATH 12FR 33CM (SHEATH) ×1
DRYSEAL FLEXSHEATH 16FR 33CM (SHEATH) ×1
ELECT CAUTERY BLADE 6.4 (BLADE) ×3 IMPLANT
ELECT REM PT RETURN 9FT ADLT (ELECTROSURGICAL) ×6
ELECTRODE REM PT RTRN 9FT ADLT (ELECTROSURGICAL) ×4 IMPLANT
EXCLDR TRNK 28.5X14.5X12 16F (Endovascular Graft) ×3 IMPLANT
EXCLUDER TNK 28.5X14.5X12 16F (Endovascular Graft) ×2 IMPLANT
GAUZE SPONGE 2X2 8PLY STRL LF (GAUZE/BANDAGES/DRESSINGS) ×4 IMPLANT
GLIDEWIRE ADV .035X260CM (WIRE) ×6 IMPLANT
GLOVE BIOGEL PI IND STRL 7.5 (GLOVE) ×2 IMPLANT
GLOVE BIOGEL PI INDICATOR 7.5 (GLOVE) ×1
GLOVE SURG SS PI 7.5 STRL IVOR (GLOVE) ×3 IMPLANT
GOWN STRL REUS W/ TWL LRG LVL3 (GOWN DISPOSABLE) ×4 IMPLANT
GOWN STRL REUS W/ TWL XL LVL3 (GOWN DISPOSABLE) ×4 IMPLANT
GOWN STRL REUS W/TWL LRG LVL3 (GOWN DISPOSABLE) ×2
GOWN STRL REUS W/TWL XL LVL3 (GOWN DISPOSABLE) ×2
GRAFT BALLN CATH 65CM (STENTS) ×2 IMPLANT
GUIDEWIRE ANGLED .035X150CM (WIRE) IMPLANT
KIT BASIN OR (CUSTOM PROCEDURE TRAY) ×3 IMPLANT
KIT TURNOVER KIT B (KITS) ×3 IMPLANT
LEG CONTRALATERAL 16X12X12 (Vascular Products) ×3 IMPLANT
LEG CONTRALATERAL 16X12X14 (Vascular Products) ×1 IMPLANT
NEEDLE PERC 18GX7CM (NEEDLE) ×3 IMPLANT
NS IRRIG 1000ML POUR BTL (IV SOLUTION) ×3 IMPLANT
PACK ENDOVASCULAR (PACKS) ×3 IMPLANT
PAD ARMBOARD 7.5X6 YLW CONV (MISCELLANEOUS) ×6 IMPLANT
PENCIL BUTTON HOLSTER BLD 10FT (ELECTRODE) ×3 IMPLANT
PERCLOSE PROGLIDE 6F (VASCULAR PRODUCTS) ×18
SET MICROPUNCTURE 5F STIFF (MISCELLANEOUS) ×3 IMPLANT
SHEATH BRITE TIP 8FR 23CM (SHEATH) ×3 IMPLANT
SHEATH BRITE TIP 8FR 35CM (SHEATH) ×3 IMPLANT
SHEATH DRYSEAL FLEX 12FR 33CM (SHEATH) ×2 IMPLANT
SHEATH DRYSEAL FLEX 16FR 33CM (SHEATH) ×2 IMPLANT
SHEATH PINNACLE 8F 10CM (SHEATH) ×3 IMPLANT
SHIELD RADPAD ABSORB 11X34 (MISCELLANEOUS) ×3 IMPLANT
SPONGE GAUZE 2X2 STER 10/PKG (GAUZE/BANDAGES/DRESSINGS) ×2
STENT GRAFT BALLN CATH 65CM (STENTS) ×1
STENT GRAFT CONTRALAT 16X12X14 (Vascular Products) ×2 IMPLANT
STOPCOCK MORSE 400PSI 3WAY (MISCELLANEOUS) ×3 IMPLANT
SUT PROLENE 5 0 C 1 24 (SUTURE) IMPLANT
SUT VIC AB 2-0 CT1 27 (SUTURE)
SUT VIC AB 2-0 CT1 TAPERPNT 27 (SUTURE) IMPLANT
SUT VIC AB 3-0 SH 27 (SUTURE)
SUT VIC AB 3-0 SH 27X BRD (SUTURE) IMPLANT
SUT VICRYL 4-0 PS2 18IN ABS (SUTURE) IMPLANT
SYR 10ML LL (SYRINGE) ×3 IMPLANT
SYR 20ML LL LF (SYRINGE) ×3 IMPLANT
TOWEL GREEN STERILE (TOWEL DISPOSABLE) ×3 IMPLANT
TRAY FOLEY MTR SLVR 16FR STAT (SET/KITS/TRAYS/PACK) ×3 IMPLANT
TUBING HIGH PRESSURE 120CM (CONNECTOR) ×3 IMPLANT
TUBING INJECTOR 48 (MISCELLANEOUS) ×3 IMPLANT
WIRE AMPLATZ SS-J .035X180CM (WIRE) IMPLANT
WIRE BENTSON .035X145CM (WIRE) ×6 IMPLANT
YANKAUER SUCT BULB TIP NO VENT (SUCTIONS) ×3 IMPLANT

## 2019-08-01 NOTE — Interval H&P Note (Signed)
History and Physical Interval Note:  08/01/2019 10:20 AM  Deborah Moses  has presented today for surgery, with the diagnosis of ABDOMINAL AORTIC ANEURYSM.  The various methods of treatment have been discussed with the patient and family. After consideration of risks, benefits and other options for treatment, the patient has consented to  Procedure(s): ABDOMINAL AORTIC ENDOVASCULAR STENT GRAFT (N/A) as a surgical intervention.  The patient's history has been reviewed, patient examined, no change in status, stable for surgery.  I have reviewed the patient's chart and labs.  Questions were answered to the patient's satisfaction.     Durene Cal

## 2019-08-01 NOTE — Transfer of Care (Signed)
Immediate Anesthesia Transfer of Care Note  Patient: Deborah Moses  Procedure(s) Performed: ABDOMINAL AORTIC ENDOVASCULAR STENT GRAFT (N/A )  Patient Location: PACU  Anesthesia Type:General  Level of Consciousness: drowsy, patient cooperative and responds to stimulation  Airway & Oxygen Therapy: Patient Spontanous Breathing and Patient connected to face mask oxygen  Post-op Assessment: Report given to RN and Post -op Vital signs reviewed and stable  Post vital signs: Reviewed and stable  Last Vitals:  Vitals Value Taken Time  BP 125/65 08/01/19 1317  Temp    Pulse 80 08/01/19 1318  Resp 20 08/01/19 1318  SpO2 100 % 08/01/19 1318  Vitals shown include unvalidated device data.  Last Pain:  Vitals:   08/01/19 0906  PainSc: 0-No pain      Patients Stated Pain Goal: 2 (08/01/19 0906)  Complications: No complications documented.

## 2019-08-01 NOTE — Discharge Instructions (Signed)
  [facility]   Discharge Instructions  Endovascular Aortic Aneurysm Repair  Please refer to the following instructions for your post-procedure care. Your surgeon or Physician Assistant will discuss any changes with you.  Activity  You are encouraged to walk as much as you can. You can slowly return to normal activities but must avoid strenuous activity and heavy lifting until your doctor tells you it's OK. Avoid activities such as vacuuming or swinging a gold club. It is normal to feel tired for several weeks after your surgery. Do not drive until your doctor gives the OK and you are no longer taking prescription pain medications. It is also normal to have difficulty with sleep habits, eating, and bowel movements after surgery. These will go away with time.  Bathing/Showering  Shower daily after you go home.  Do not soak in a bathtub, hot tub, or swim until the incision heals completely.  If you have incisions in your groin, wash the groin wounds with soap and water daily and pat dry. (No tub bath-only shower)  Then put a dry gauze or washcloth there to keep this area dry to help prevent wound infection daily and as needed.  Do not use Vaseline or neosporin on your incisions.  Only use soap and water on your incisions and then protect and keep dry.  Incision Care  Shower every day. Clean your incision with mild soap and water. Pat the area dry with a clean towel. You do not need a bandage unless otherwise instructed. Do not apply any ointments or creams to your incision. If you clothing is irritating, you may cover your incision with a dry gauze pad.  Diet  Resume your normal diet. There are no special food restrictions following this procedure. A low fat/low cholesterol diet is recommended for all patients with vascular disease. In order to heal from your surgery, it is CRITICAL to get adequate nutrition. Your body requires vitamins, minerals, and protein.  Vegetables are the best source of vitamins and minerals. Vegetables also provide the perfect balance of protein. Processed food has little nutritional value, so try to avoid this.  Medications  Resume taking all of your medications unless your doctor or nurse practitioner tells you not to. If your incision is causing pain, you may take over-the-counter pain relievers such as acetaminophen (Tylenol). If you were prescribed a stronger pain medication, please be aware these medications can cause nausea and constipation. Prevent nausea by taking the medication with a snack or meal. Avoid constipation by drinking plenty of fluids and eating foods with a high amount of fiber, such as fruits, vegetables, and grains.  Do not take Tylenol if you are taking prescription pain medications.   Follow up  Our office will schedule a follow-up appointment with a CT scan 3-4 weeks after your surgery.  Please call us immediately for any of the following conditions  Severe or worsening pain in your legs or feet or in your abdomen back or chest. Increased pain, redness, drainage (pus) from your incision site. Increased abdominal pain, bloating, nausea, vomiting or persistent diarrhea. Fever of 101 degrees or higher. Swelling in your leg (s),  Reduce your risk of vascular disease  Stop smoking. If you would like help call [facility] at 1-800-QUIT-NOW (1-800-784-8669) or [facility] at [phone]. Manage your cholesterol Maintain a desired weight Control your diabetes Keep your blood pressure down  If you have questions, please call the office at [phone].  

## 2019-08-01 NOTE — Anesthesia Procedure Notes (Signed)
Procedure Name: Intubation Date/Time: 08/01/2019 11:07 AM Performed by: Tillman Abide, CRNA Pre-anesthesia Checklist: Patient identified, Emergency Drugs available, Suction available and Patient being monitored Patient Re-evaluated:Patient Re-evaluated prior to induction Oxygen Delivery Method: Circle System Utilized Preoxygenation: Pre-oxygenation with 100% oxygen Induction Type: IV induction Ventilation: Mask ventilation without difficulty Laryngoscope Size: Miller and 2 Grade View: Grade I Tube type: Oral Tube size: 7.0 mm Number of attempts: 1 Airway Equipment and Method: Stylet and Oral airway Placement Confirmation: ETT inserted through vocal cords under direct vision,  positive ETCO2 and breath sounds checked- equal and bilateral Secured at: 22 cm Tube secured with: Tape Dental Injury: Teeth and Oropharynx as per pre-operative assessment

## 2019-08-01 NOTE — Anesthesia Preprocedure Evaluation (Signed)
Anesthesia Evaluation  Patient identified by MRN, date of birth, ID band Patient awake    Reviewed: Allergy & Precautions, NPO status , Patient's Chart, lab work & pertinent test results  Airway Mallampati: I  TM Distance: >3 FB Neck ROM: Full    Dental   Pulmonary    Pulmonary exam normal        Cardiovascular hypertension, Pt. on medications Normal cardiovascular exam+ pacemaker      Neuro/Psych    GI/Hepatic   Endo/Other    Renal/GU      Musculoskeletal   Abdominal   Peds  Hematology   Anesthesia Other Findings   Reproductive/Obstetrics                             Anesthesia Physical Anesthesia Plan  ASA: III  Anesthesia Plan: General   Post-op Pain Management:    Induction: Intravenous  PONV Risk Score and Plan: 3 and Ondansetron and Treatment may vary due to age or medical condition  Airway Management Planned: Oral ETT  Additional Equipment: Arterial line  Intra-op Plan:   Post-operative Plan: Extubation in OR  Informed Consent: I have reviewed the patients History and Physical, chart, labs and discussed the procedure including the risks, benefits and alternatives for the proposed anesthesia with the patient or authorized representative who has indicated his/her understanding and acceptance.       Plan Discussed with: CRNA and Surgeon  Anesthesia Plan Comments:         Anesthesia Quick Evaluation

## 2019-08-01 NOTE — Op Note (Signed)
Patient name: Deborah Moses MRN: [id number] DOB: 1931-08-25 Sex: female  08/01/2019 Pre-operative Diagnosis: Abdominal aortic aneurysm Post-operative diagnosis:  Same Surgeon:  Annamarie Major Assistants: Aldona Bar Ryne Procedure:   #1: Endovascular repair of abdominal aortic aneurysm   #2: Bilateral ultrasound-guided common femoral artery access   #3: Abdominal aortogram   #4: Catheter aorta x2 Anesthesia: General Blood Loss: 50 cc Specimens: None  Findings: Complete exclusion Devices used: Main body was primary left Gore conformable excluder 28 x 14 x 12.  Ipsilateral left limb was a Gore 12 x 12.  Contralateral right limb was a Gore 12 x 14.  Indications: This is an 86 year old female with progressively enlarging abdominal aortic aneurysm with maximum diameter of 6.2 cm.  We had an extensive discussion in the office regarding repair.  She is very worried about the rupture of her aneurysm and has decided to proceed with repair.  Procedure:  The patient was identified in the holding area and taken to [place] 16  The patient was then placed supine on the table. general anesthesia was administered.  The patient was prepped and draped in the usual sterile fashion.  A time out was called and antibiotics were administered.  Ultrasound was used to evaluate bilateral common femoral arteries which were patent but relatively calcified.  The calcium appeared to be less prominent at the top of the femoral head.  11 blade was used to make a skin nick bilaterally.  Under ultrasound guidance, bilateral common femoral arteries were cannulated under ultrasound guidance with a micropuncture needle.  A 018 wire was advanced without resistance and a micropuncture sheath was placed.  Bentson wires were then inserted bilaterally.  The subcutaneous tissue was dilated with an 8 French dilator and Pro-glide devices were placed at the 11:00 and 1 o'clock position for preclosure.  8 French sheaths were placed  bilaterally.  Patient was then fully heparinized.  A Glidewire advantages were placed bilaterally.  A 12 French sheath was advanced into the aorta from the right side and a 16 French sheath was advanced from the left side.  A Omni Flush catheter was then inserted as well as the main device up the left side.  This was a Dealer conformable 28 x 14 x 12.  An abdominal aortogram was then performed locating the renal arteries.  There was slight angulation.  The main body was then positioned at the lowest left renal artery.  It was in angle to fit the tortuosity and then deployed down to the contralateral gate.  The device was then conformed and then advanced and redeployed.  Another arteriogram was performed that showed excellent position of the stent graft.  A second access in the 12 [place] sheath was obtained with a Bentson wire and a Berenstein catheter.  The contralateral gate was then successfully cannulated.  An Omni Flush catheter was able to be freely rotated within the main body, confirming successful cannulation.  The image detector was then rotated to a left anterior oblique position and a retrograde injection was performed through the sheath in the right groin.  This located the right hypogastric artery.  A 12 x 12 Gore limb was then inserted and deployed at the level the right hypogastric artery.  Next, the remaining portion of the ipsilateral limb was deployed.  The image detector was rotated to the right anterior oblique position and a retrograde injection was performed through the sheath in the left groin locating the left hypogastric artery.  A  12 x 14 Gore limb was then inserted and deployed landing at the level of the left hypogastric artery.  Next, a MOB balloon was inserted.  Proximal and distal attachment sites were molded with the balloon as well as device overlap.  A completion arteriogram was then performed which showed complete exclusion of the aneurysm sac with no evidence of a type I or  type III leak.  There was continued patency of bilateral renal arteries as well as bilateral hypogastric and external iliac arteries.  Bentson wires were then inserted.  The sheath was removed on the right side and the Pro-glide devices were secured closing the arteriotomy site.  On the left side I had to place 2 additional Pro-glide's for hemostasis.  50 mg of protamine was then administered.  Once hemostasis was satisfactory, cautery was used on the subcutaneous tissue followed by Dermabond.  The patient had bilateral peroneal artery Doppler signals at the end of the procedure.  The patient had preoperative ABIs in the 0.6 range.  There was a brisk Doppler signal distal to the closure site.  I was a little concerned that I could have narrowed the common femoral arteries bilaterally given the extensive calcification in her common femoral arteries.  However since she is a frail 86 year old lady and she had peroneal Doppler signals, I elected not to explore the groins.  She was successfully extubated taken to recovery in stable condition.   Disposition: To PACU stable   V. Durene Cal, M.D., [facility] [place] Office: [phone] Pager:  [phone]

## 2019-08-01 NOTE — Progress Notes (Signed)
PHARMACY NOTE:  ANTIMICROBIAL RENAL DOSAGE ADJUSTMENT  Current antimicrobial regimen includes a mismatch between antimicrobial dosage and estimated renal function.  As per policy approved by the Pharmacy & Therapeutics and Medical Executive Committees, the antimicrobial dosage will be adjusted accordingly.  Current antimicrobial dosage:  Ancef 2gm IV Q8H x 2 doses  Indication: surgical prophylaxis  Renal Function:  Estimated Creatinine Clearance: 26.4 mL/min (A) (by C-G formula based on SCr of 1.21 mg/dL (H)). []      On intermittent HD, scheduled: []      On CRRT    Antimicrobial dosage has been changed to:  Ancef 1gm IV Q12H x 2 doses  Janylah Belgrave D. , PharmD, BCPS, BCCCP 08/01/2019, 4:18 PM

## 2019-08-01 NOTE — Progress Notes (Signed)
Called [employer] Rep, Jana Half, who stated she will be by at 10:00 AM prior to procedure.

## 2019-08-01 NOTE — Anesthesia Procedure Notes (Signed)
Arterial Line Insertion Start/End6/16/2021 9:51 AM, 08/01/2019 10:00 AM Performed by: Elliot Dally, CRNA, CRNA  Patient location: Pre-op. Preanesthetic checklist: patient identified, IV checked, site marked, risks and benefits discussed, surgical consent, monitors and equipment checked, pre-op evaluation, timeout performed and anesthesia consent Left, radial was placed Catheter size: 20 G Hand hygiene performed  and maximum sterile barriers used  Allen's test indicative of satisfactory collateral circulation Attempts: 1 Procedure performed without using ultrasound guided technique. Following insertion, dressing applied and Biopatch. Post procedure assessment: normal  Patient tolerated the procedure well with no immediate complications.

## 2019-08-01 NOTE — Anesthesia Postprocedure Evaluation (Signed)
Anesthesia Post Note  Patient: Deborah Moses  Procedure(s) Performed: ABDOMINAL AORTIC ENDOVASCULAR STENT GRAFT (N/A ) ULTRASOUND GUIDANCE FOR VASCULAR ACCESS (N/A Groin)     Patient location during evaluation: PACU Anesthesia Type: General Level of consciousness: awake and alert Pain management: pain level controlled Vital Signs Assessment: post-procedure vital signs reviewed and stable Respiratory status: spontaneous breathing, nonlabored ventilation, respiratory function stable and patient connected to nasal cannula oxygen Cardiovascular status: blood pressure returned to baseline and stable Postop Assessment: no apparent nausea or vomiting Anesthetic complications: no   No complications documented.  Last Vitals:  Vitals:   08/01/19 1608 08/01/19 1626  BP: (!) 132/56   Pulse: 61   Resp: 20   Temp: 36.5 C   SpO2: 100% 95%    Last Pain:  Vitals:   08/01/19 1608  TempSrc: Oral  PainSc:                  [name] DAVID

## 2019-08-01 NOTE — Progress Notes (Signed)
Pt arrived to rm 26 from PACU with foley catheter.  Initiated tele. CHG and assessment performed. VSS. Pt alert and oriented. Call bell within reach.   Lawson Radar, RN

## 2019-08-02 ENCOUNTER — Encounter (HOSPITAL_COMMUNITY): Payer: Self-pay | Admitting: Surgery

## 2019-08-02 LAB — BASIC METABOLIC PANEL
Anion gap: 11 (ref 5–15)
BUN: 21 mg/dL (ref 8–23)
CO2: 23 mmol/L (ref 22–32)
Calcium: 8.6 mg/dL — ABNORMAL LOW (ref 8.9–10.3)
Chloride: 104 mmol/L (ref 98–111)
Creatinine, Ser: 1.17 mg/dL — ABNORMAL HIGH (ref 0.44–1.00)
GFR calc Af Amer: 49 mL/min — ABNORMAL LOW (ref >60)
GFR calc non Af Amer: 42 mL/min — ABNORMAL LOW (ref >60)
Glucose, Bld: 127 mg/dL — ABNORMAL HIGH (ref 70–99)
Potassium: 4.8 mmol/L (ref 3.5–5.1)
Sodium: 138 mmol/L (ref 135–145)

## 2019-08-02 LAB — CBC
HCT: 32 % — ABNORMAL LOW (ref 36.0–46.0)
Hemoglobin: 10.6 g/dL — ABNORMAL LOW (ref 12.0–15.0)
MCH: 31.6 pg (ref 26.0–34.0)
MCHC: 33.1 g/dL (ref 30.0–36.0)
MCV: 95.5 fL (ref 80.0–100.0)
Platelets: 156 10*3/uL (ref 150–400)
RBC: 3.35 MIL/uL — ABNORMAL LOW (ref 3.87–5.11)
RDW: 11.3 % — ABNORMAL LOW (ref 11.5–15.5)
WBC: 8.7 10*3/uL (ref 4.0–10.5)
nRBC: 0 % (ref 0.0–0.2)

## 2019-08-02 LAB — POCT ACTIVATED CLOTTING TIME
Activated Clotting Time: 202 seconds
Activated Clotting Time: 219 seconds

## 2019-08-02 MED ORDER — OXYCODONE-ACETAMINOPHEN 5-325 MG PO TABS: 1.0000 | ORAL_TABLET | Freq: Four times a day (QID) | ORAL | 0 refills | Status: DC | PRN

## 2019-08-02 NOTE — Progress Notes (Signed)
Discharge instructions provided to patient and daughter. All medications, follow up appointments, and discharge instructions provided. IV out. Monitor off CCMD notified. Patient discharging home with daughter.  Ginette Otto, RN

## 2019-08-02 NOTE — Discharge Summary (Signed)
[facility]AR Discharge Summary   Deborah Moses Jun 27, 1931 86 y.o. female  MRN: [id number]  Admission Date: 08/01/2019  Discharge Date: 08/02/2019  Physician: Nada Libman, MD  Admission Diagnosis: AAA (abdominal aortic aneurysm) [facility]) [I71.4]   HPI:   This is a 86 y.o. female with a known abdominal aortic aneurysm.  This measured 4.6 cm in June 2019.  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  She is back today for follow-up.  She denies any abdominal pain or back pain.  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 She is a non-smoker. She is medically managed for hypertension. She takes a statin for hypercholesterolemia.  Hospital Course:  The patient was admitted to the hospital and taken to the operating room on 08/01/2019 and underwent: #1: Endovascular repair of abdominal aortic aneurysm #2: Bilateral ultrasound-guided common femoral artery access #3: Abdominal aortogram #4: Catheter aorta x2    Findings: Complete exclusion  The pt tolerated the procedure well and was transported to the PACU in good condition.   Findings:    Complete exclusion  By POD 1, She feels very good this morning.  She has tolerated her diet.  Both groin sites are soft without hematoma.  She has monophasic to biphasic signals in the bilateral dorsalis pedis and posterior tibial arteries.  I anticipate discharge home later today assuming she can void after her Foley has been removed and she is able to ambulate without assistance.  The remainder of the hospital course consisted of increasing mobilization and increasing intake of solids without difficulty.  CBC    Component Value Date/Time   WBC 8.7 08/02/2019 0249   RBC 3.35 (L) 08/02/2019 0249   HGB 10.6 (L) 08/02/2019 0249   HCT 32.0 (L) 08/02/2019 0249   PLT 156 08/02/2019 0249   MCV 95.5 08/02/2019 0249   MCH 31.6 08/02/2019 0249   MCHC 33.1  08/02/2019 0249   RDW 11.3 (L) 08/02/2019 0249    BMET    Component Value Date/Time   NA 138 08/02/2019 0249   K 4.8 08/02/2019 0249   CL 104 08/02/2019 0249   CO2 23 08/02/2019 0249   GLUCOSE 127 (H) 08/02/2019 0249   BUN 21 08/02/2019 0249   CREATININE 1.17 (H) 08/02/2019 0249   CALCIUM 8.6 (L) 08/02/2019 0249   GFRNONAA 42 (L) 08/02/2019 0249   GFRAA 49 (L) 08/02/2019 0249       Discharge Instructions    Discharge patient   Complete by: As directed    Discharge disposition: 01-Home or Self Care   Discharge patient date: 08/02/2019      Discharge Diagnosis:  AAA (abdominal aortic aneurysm) (HCC) [I71.4]  Secondary Diagnosis: Patient Active Problem List   Diagnosis Date Noted  . AAA (abdominal aortic aneurysm) (HCC) 08/01/2019  . Complete heart block (HCC) 07/11/2017   Past Medical History:  Diagnosis Date  . AAA (abdominal aortic aneurysm) (HCC)   . Heart block AV third degree (HCC)   . HTN (hypertension)   . Hyperlipidemia   . Vertigo      Allergies as of 08/02/2019   No Known Allergies     Medication List    TAKE these medications   acetaminophen 500 MG tablet Commonly known as: TYLENOL Take 500 mg by mouth every 6 (six) hours as needed for moderate pain.   alendronate 70 MG tablet Commonly known as: FOSAMAX Take 70 mg by mouth once  a week. Take with a full glass of water on an empty stomach.   amLODipine 5 MG tablet Commonly known as: NORVASC Take 5 mg by mouth daily.   apixaban 2.5 MG Tabs tablet Commonly known as: Eliquis Take 1 tablet (2.5 mg total) by mouth 2 (two) times daily.   calcium-vitamin D 500-200 MG-UNIT Tabs tablet Commonly known as: OSCAL WITH D Take 1 tablet by mouth daily.   Fish Oil 1000 MG Cpdr Take 1,000 mg by mouth in the morning, at noon, and at bedtime.   lisinopril 20 MG tablet Commonly known as: ZESTRIL Take 40 mg by mouth daily.   lovastatin 20 MG tablet Commonly known as: MEVACOR Take 20 mg by mouth at  bedtime.   metoprolol succinate 25 MG 24 hr tablet Commonly known as: TOPROL-XL Take 25 mg by mouth daily.   oxyCODONE-acetaminophen 5-325 MG tablet Commonly known as: Percocet Take 1 tablet by mouth every 6 (six) hours as needed.       Discharge Instructions:  [facility]  Discharge Instructions Endovascular Aortic Aneurysm Repair  Please refer to the following instructions for your post-procedure care. Your surgeon or Physician Assistant will discuss any changes with you.  Activity  You are encouraged to walk as much as you can. You can slowly return to normal activities but must avoid strenuous activity and heavy lifting until your doctor tells you it's OK. Avoid activities such as vacuuming or swinging a gold club. It is normal to feel tired for several weeks after your surgery. Do not drive until your doctor gives the OK and you are no longer taking prescription pain medications. It is also normal to have difficulty with sleep habits, eating, and bowel movements after surgery. These will go away with time.  Bathing/Showering  You may shower after you go home. If you have an incision, do not soak in a bathtub, hot tub, or swim until the incision heals completely.  Incision Care  Shower every day. Clean your incision with mild soap and water. Pat the area dry with a clean towel. You do not need a bandage unless otherwise instructed. Do not apply any ointments or creams to your incision. If you clothing is irritating, you may cover your incision with a dry gauze pad.  Diet  Resume your normal diet. There are no special food restrictions following this procedure. A low fat/low cholesterol diet is recommended for all patients with vascular disease. In order to heal from your surgery, it is CRITICAL to get adequate nutrition. Your body requires vitamins, minerals, and protein. Vegetables are the best source of vitamins and minerals. Vegetables also  provide the perfect balance of protein. Processed food has little nutritional value, so try to avoid this.  Medications  Resume taking all of your medications unless your doctor or Physician Assistnat tells you not to. If your incision is causing pain, you may take over-the-counter pain relievers such as acetaminophen (Tylenol). If you were prescribed a stronger pain medication, please be aware these medications can cause nausea and constipation. Prevent nausea by taking the medication with a snack or meal. Avoid constipation by drinking plenty of fluids and eating foods with a high amount of fiber, such as fruits, vegetables, and grains.  Do not take Tylenol if you are taking prescription pain medications.   Follow up  Our office will schedule a follow-up appointment with a C.T. scan 3-4 weeks after your surgery.  Please call us immediately for any of  the following conditions  . Severe or worsening pain in your legs or feet or in your abdomen back or chest. . Increased pain, redness, drainage (pus) from your incision sit. . Increased abdominal pain, bloating, nausea, vomiting or persistent diarrhea. . Fever of 101 degrees or higher. . Swelling in your leg (s), .  Reduce your risk of vascular disease  .Stop smoking. If you would like help call [facility] at 1-800-QUIT-NOW 905-232-0758) or [place] at [phone]. .Manage your cholesterol .Maintain a desired weight .Control your diabetes .Keep your blood pressure down  If you have questions, please call the office at [phone].   Prescriptions given: 1.  Roxicet #8 No Refill  Disposition: home  Patient's condition: is Good  Follow up: 1. Dr. Trula Slade in 4 weeks with CTA protocol   Leontine Locket, PA-C [facility] [phone] 08/02/2019  11:48 AM   - For VQI Registry use - Post-op:  Time to Extubation: [x]  In OR, [ ]  < 12 hrs, [ ]  12-24 hrs, [ ]  >=24 hrs Vasopressors Req. Post-op: No MI:  No., [ ]  Troponin only, [ ]  EKG or Clinical New Arrhythmia: No CHF: No ICU Stay: 1 day in stepdown Transfusion: No     If yes, n/a units given  Complications: Resp failure: No., [ ]  Pneumonia, [ ]  Ventilator Chg in renal function: No., [ ]  Inc. Cr > 0.5, [ ]  Temp. Dialysis,  [ ]  Permanent dialysis Leg ischemia: No., no Surgery needed, [ ]  Yes, Surgery needed,  [ ]  Amputation Bowel ischemia: No., [ ]  Medical Rx, [ ]  Surgical Rx Wound complication: No., [ ]  Superficial separation/infection, [ ]  Return to OR Return to OR: No  Return to OR for bleeding: No Stroke: No., [ ]  Minor, [ ]  Major  Discharge medications: Statin use:  Yes  ASA use:  No  Plavix use:  No  Beta blocker use:  Yes  ARB use:  No ACEI use:  Yes CCB use:  Yes

## 2019-08-02 NOTE — Progress Notes (Addendum)
  Progress Note    08/02/2019 7:38 AM 1 Day Post-Op  Subjective:  Feels good this morning.  Says her feet feel fine.  The foley & a-line was removed at 5am  Afebrile HR 50's-60's  120's-130's systolic 93% RA  Vitals:   08/01/19 2359 08/02/19 0502  BP: (!) 137/52 134/60  Pulse: 61 63  Resp: 20 19  Temp: 98 F (36.7 C) 98.2 F (36.8 C)  SpO2: 94% 92%    Physical Exam: Cardiac:  regular Lungs:  Non labored Incisions:  Bilateral groins are soft without hematoma Extremities:  + doppler signals right PT and left DP; motor and sensory are in tact Abdomen:  Soft, NT/ND  CBC    Component Value Date/Time   WBC 8.7 08/02/2019 0249   RBC 3.35 (L) 08/02/2019 0249   HGB 10.6 (L) 08/02/2019 0249   HCT 32.0 (L) 08/02/2019 0249   PLT 156 08/02/2019 0249   MCV 95.5 08/02/2019 0249   MCH 31.6 08/02/2019 0249   MCHC 33.1 08/02/2019 0249   RDW 11.3 (L) 08/02/2019 0249    BMET    Component Value Date/Time   NA 138 08/02/2019 0249   K 4.8 08/02/2019 0249   CL 104 08/02/2019 0249   CO2 23 08/02/2019 0249   GLUCOSE 127 (H) 08/02/2019 0249   BUN 21 08/02/2019 0249   CREATININE 1.17 (H) 08/02/2019 0249   CALCIUM 8.6 (L) 08/02/2019 0249   GFRNONAA 42 (L) 08/02/2019 0249   GFRAA 49 (L) 08/02/2019 0249    INR    Component Value Date/Time   INR 1.2 08/01/2019 1358     Intake/Output Summary (Last 24 hours) at 08/02/2019 0738 Last data filed at 08/01/2019 1740 Gross per 24 hour  Intake 1505.08 ml  Output 1000 ml  Net 505.08 ml     Assessment:  86 y.o. female is s/p:  EVAR  1 Day Post-Op  Plan: -pt doing well this am with + doppler signals in bilateral feet -abdomen soft -foley removed earlier this am-pt needs to void and walk and probable discharge later today. -will need ABI's in office with follow up in several months.  F/u with Dr. Myra Gianotti in 4 weeks with CTA   Doreatha Massed, PA-C [facility] [phone] 08/02/2019 7:38 AM   I agree  with the above.  I have seen and evaluated the patient.  She is postop day #1 status post endovascular aneurysm repair.  She feels very good this morning.  She has tolerated her diet.  Both groin sites are soft without hematoma.  She has monophasic to biphasic signals in the bilateral dorsalis pedis and posterior tibial arteries.  I anticipate discharge home later today assuming she can void after her Foley has been removed and she is able to ambulate without assistance.  Durene Cal

## 2019-08-02 NOTE — Progress Notes (Signed)
A-line and Foley removed at this time per order. Catheter and foley intact upon removal. Patient tolerated well. Patient due to void by 1200. Will continue to monitor.  Willa Frater, RN

## 2019-08-08 ENCOUNTER — Other Ambulatory Visit: Payer: Self-pay

## 2019-08-08 DIAGNOSIS — I714 Abdominal aortic aneurysm, without rupture, unspecified: Secondary | ICD-10-CM

## 2019-08-13 ENCOUNTER — Other Ambulatory Visit: Payer: Medicare Other

## 2019-08-13 ENCOUNTER — Ambulatory Visit
Admission: RE | Admit: 2019-08-13 | Discharge: 2019-08-13 | Disposition: A | Discharge status: Home / Self Care | Payer: Medicare Other | Source: Ambulatory Visit | Attending: Surgery | Admitting: Surgery

## 2019-08-13 DIAGNOSIS — I714 Abdominal aortic aneurysm, without rupture, unspecified: Secondary | ICD-10-CM

## 2019-08-13 MED ORDER — IOPAMIDOL (ISOVUE-370) INJECTION 76%
75.0000 mL | Freq: Once | INTRAVENOUS | Status: AC | PRN
  Administered 2019-08-13: 75 mL via INTRAVENOUS

## 2019-08-27 ENCOUNTER — Other Ambulatory Visit: Payer: Self-pay

## 2019-08-27 ENCOUNTER — Encounter: Payer: Self-pay | Admitting: Surgery

## 2019-08-27 ENCOUNTER — Ambulatory Visit (INDEPENDENT_AMBULATORY_CARE_PROVIDER_SITE_OTHER): Payer: Medicare Other | Admitting: Surgery

## 2019-08-27 VITALS — BP 135/75 | HR 70 | Temp 97.3°F | Resp 18 | Ht 62.0 in | Wt 117.3 lb

## 2019-08-27 DIAGNOSIS — I714 Abdominal aortic aneurysm, without rupture, unspecified: Secondary | ICD-10-CM

## 2019-08-27 NOTE — Progress Notes (Signed)
Patient name: Deborah Moses MRN: [id number] DOB: 1931/07/17 Sex: female  REASON FOR VISIT:     post op  HISTORY OF PRESENT ILLNESS:   Deborah Moses is a 86 y.o. female who is status post endovascular repair of an abdominal aortic aneurysm with maximum diameter of 5.2 cm on 08/01/2019.  Her postoperative course was uncomplicated.  She is back today for follow-up.  She states that she is having slightly shorter distance calf cramping in her right leg  Patient has a pacemaker for symptomatic bradycardia and third-degree heart block. She is a non-smoker. She is medically managed for hypertension. She takes a statin for hypercholesterolemia  CURRENT MEDICATIONS:    Current Outpatient Medications  Medication Sig Dispense Refill  . acetaminophen (TYLENOL) 500 MG tablet Take 500 mg by mouth every 6 (six) hours as needed for moderate pain.     Marland Kitchen alendronate (FOSAMAX) 70 MG tablet Take 70 mg by mouth once a week. Take with a full glass of water on an empty stomach.    Marland Kitchen amLODipine (NORVASC) 5 MG tablet Take 5 mg by mouth daily.    Marland Kitchen apixaban (ELIQUIS) 2.5 MG TABS tablet Take 1 tablet (2.5 mg total) by mouth 2 (two) times daily. 180 tablet 3  . calcium-vitamin D (OSCAL WITH D) 500-200 MG-UNIT TABS tablet Take 1 tablet by mouth daily.     Marland Kitchen lisinopril (PRINIVIL,ZESTRIL) 20 MG tablet Take 40 mg by mouth daily.     Marland Kitchen lovastatin (MEVACOR) 20 MG tablet Take 20 mg by mouth at bedtime.    . metoprolol succinate (TOPROL-XL) 25 MG 24 hr tablet Take 25 mg by mouth daily.    . Omega-3 Fatty Acids (FISH OIL) 1000 MG CPDR Take 1,000 mg by mouth in the morning, at noon, and at bedtime.     Marland Kitchen oxyCODONE-acetaminophen (PERCOCET) 5-325 MG tablet Take 1 tablet by mouth every 6 (six) hours as needed. (Patient not taking: Reported on 08/27/2019) 8 tablet 0   No current facility-administered medications for this visit.    REVIEW OF SYSTEMS:   [X]  denotes positive finding, [ ]   denotes negative finding Cardiac  Comments:  Chest pain or chest pressure:    Shortness of breath upon exertion:    Short of breath when lying flat:    Irregular heart rhythm:    Constitutional    Fever or chills:      PHYSICAL EXAM:   Vitals:   08/27/19 1431  BP: 135/75  Pulse: 70  Resp: 18  Temp: (!) 97.3 F (36.3 C)  SpO2: 95%  Weight: 117 lb 4.8 oz (53.2 kg)  Height: 5\' 2"  (1.575 m)    GENERAL: The patient is a well-nourished female, in no acute distress. The vital signs are documented above. CARDIOVASCULAR: There is a regular rate and rhythm. PULMONARY: Non-labored respirations Groin areas are well-healed  STUDIES:   CT angiogram: Interval surgical changes of endovascular repair of infrarenal abdominal aortic aneurysm with infrarenal fixation. Early postsurgical changes include small volume of gas within the excluded aneurysm sac, and surgical changes at the bilateral femoral artery access sites. No significant change in the size/configuration of the excluded aneurysm on the current CT.  Small type 2 endoleak, favored to be arising from lumbar arteries posterior to the flow divider.  Aortic Atherosclerosis (ICD10-I70.0).  Similar appearance of mesenteric arterial disease.  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   MEDICAL ISSUES:   AAA: Stable aneurysm sac following repair with maximum diameter of 5.2 cm.  There is a small endoleak.  I will repeat her ultrasound in 6 months   Atherosclerotic lower extremity vascular disease: The patient is having slightly worsening right leg claudication symptoms.  This could potentially be from narrowing the common femoral artery with the Pro-glide as her arteries were very diseased.  She states that she can tolerate her level of disability.  Therefore no intervention would be recommended at this time.  I will check ABIs when she returns.  Deborah Cross, MD, FACS [facility] [phone] Pager [phone]

## 2019-08-28 ENCOUNTER — Other Ambulatory Visit: Payer: Self-pay | Admitting: *Deleted

## 2019-08-28 DIAGNOSIS — I714 Abdominal aortic aneurysm, without rupture, unspecified: Secondary | ICD-10-CM

## 2019-08-28 DIAGNOSIS — I739 Peripheral vascular disease, unspecified: Secondary | ICD-10-CM

## 2019-10-01 DIAGNOSIS — M818 Other osteoporosis without current pathological fracture: Secondary | ICD-10-CM | POA: Diagnosis not present

## 2019-10-01 DIAGNOSIS — E7849 Other hyperlipidemia: Secondary | ICD-10-CM | POA: Diagnosis not present

## 2019-10-01 DIAGNOSIS — N182 Chronic kidney disease, stage 2 (mild): Secondary | ICD-10-CM | POA: Diagnosis not present

## 2019-10-01 DIAGNOSIS — I10 Essential (primary) hypertension: Secondary | ICD-10-CM | POA: Diagnosis not present

## 2019-10-25 ENCOUNTER — Ambulatory Visit (INDEPENDENT_AMBULATORY_CARE_PROVIDER_SITE_OTHER): Payer: Medicare Other | Admitting: *Deleted

## 2019-10-25 DIAGNOSIS — I442 Atrioventricular block, complete: Secondary | ICD-10-CM

## 2019-10-25 LAB — CUP PACEART REMOTE DEVICE CHECK
Battery Remaining Longevity: 63 mo
Battery Voltage: 2.97 V
Brady Statistic AP VP Percent: 7.98 %
Brady Statistic AP VS Percent: 2.63 %
Brady Statistic AS VP Percent: 78.61 %
Brady Statistic AS VS Percent: 10.78 %
Brady Statistic RA Percent Paced: 10.83 %
Brady Statistic RV Percent Paced: 86.59 %
Date Time Interrogation Session: 20210909030805
Implantable Lead Implant Date: 20190528
Implantable Lead Implant Date: 20190528
Implantable Lead Location: 753859
Implantable Lead Location: 753860
Implantable Lead Model: 3830
Implantable Lead Model: 5076
Implantable Pulse Generator Implant Date: 20190528
Lead Channel Impedance Value: 285 Ohm
Lead Channel Impedance Value: 342 Ohm
Lead Channel Impedance Value: 361 Ohm
Lead Channel Impedance Value: 608 Ohm
Lead Channel Pacing Threshold Amplitude: 1.375 V
Lead Channel Pacing Threshold Amplitude: 1.625 V
Lead Channel Pacing Threshold Pulse Width: 0.4 ms
Lead Channel Pacing Threshold Pulse Width: 0.4 ms
Lead Channel Sensing Intrinsic Amplitude: 18.5 mV
Lead Channel Sensing Intrinsic Amplitude: 18.5 mV
Lead Channel Sensing Intrinsic Amplitude: 2.25 mV
Lead Channel Sensing Intrinsic Amplitude: 2.25 mV
Lead Channel Setting Pacing Amplitude: 2.5 V
Lead Channel Setting Pacing Amplitude: 4 V
Lead Channel Setting Pacing Pulse Width: 1 ms
Lead Channel Setting Sensing Sensitivity: 1.2 mV

## 2019-10-26 NOTE — Progress Notes (Signed)
Remote pacemaker transmission.   

## 2019-11-13 ENCOUNTER — Telehealth: Payer: Self-pay

## 2019-11-13 NOTE — Telephone Encounter (Signed)
Presenting: AS/VP 60's.

## 2019-11-13 NOTE — Telephone Encounter (Addendum)
Carelink alert 11/13/19 for AF >6 hours. Per Dt. Taylor's OV note, will follow AF.  Patient reports she has not felt well for the past few days. States she has experienced dizziness over the past few days. Reports hx of inner ear issues, she was not sure if its her ear issue or AF. Denies chest pain, palpitations or shortness of breath.  Attempted to have patient send manual transmisison, unsuccessful. Patients remote box reads code 3230. [employer] tech support number given to patient. Requested patient to call DC back with update after she speaks to [employer], patient agreeable to plan.  Eliquis 2.5 mg BID, Toprol-XL 25 mg daily, reports compliance.  Advised patient I would send this information to the AF Clinic for evaluation. Verbalizes understanding.   ED precautions given.  Presenting 11/12/19: AF/ VP 50.

## 2019-11-13 NOTE — Telephone Encounter (Signed)
Called to update patient that she is out of AF. Advised patient if she has any further questions or concerns, to please give [place] a call, verbalizes understanding.

## 2019-11-13 NOTE — Telephone Encounter (Signed)
Transmission received. I told her the nurse will review it and give her a call back. 

## 2019-11-20 DIAGNOSIS — Z23 Encounter for immunization: Secondary | ICD-10-CM | POA: Diagnosis not present

## 2019-12-31 DIAGNOSIS — M818 Other osteoporosis without current pathological fracture: Secondary | ICD-10-CM | POA: Diagnosis not present

## 2019-12-31 DIAGNOSIS — E7849 Other hyperlipidemia: Secondary | ICD-10-CM | POA: Diagnosis not present

## 2019-12-31 DIAGNOSIS — I10 Essential (primary) hypertension: Secondary | ICD-10-CM | POA: Diagnosis not present

## 2019-12-31 DIAGNOSIS — N182 Chronic kidney disease, stage 2 (mild): Secondary | ICD-10-CM | POA: Diagnosis not present

## 2020-01-24 ENCOUNTER — Ambulatory Visit (INDEPENDENT_AMBULATORY_CARE_PROVIDER_SITE_OTHER): Payer: Medicare Other

## 2020-01-24 DIAGNOSIS — I442 Atrioventricular block, complete: Secondary | ICD-10-CM

## 2020-01-24 LAB — CUP PACEART REMOTE DEVICE CHECK
Battery Remaining Longevity: 62 mo
Battery Voltage: 2.98 V
Brady Statistic AP VP Percent: 10.62 %
Brady Statistic AP VS Percent: 2.23 %
Brady Statistic AS VP Percent: 71.45 %
Brady Statistic AS VS Percent: 15.69 %
Brady Statistic RA Percent Paced: 15.05 %
Brady Statistic RV Percent Paced: 82.08 %
Date Time Interrogation Session: 20211209021025
Implantable Lead Implant Date: 20190528
Implantable Lead Implant Date: 20190528
Implantable Lead Location: 753859
Implantable Lead Location: 753860
Implantable Lead Model: 3830
Implantable Lead Model: 5076
Implantable Pulse Generator Implant Date: 20190528
Lead Channel Impedance Value: 285 Ohm
Lead Channel Impedance Value: 342 Ohm
Lead Channel Impedance Value: 342 Ohm
Lead Channel Impedance Value: 494 Ohm
Lead Channel Pacing Threshold Amplitude: 1.375 V
Lead Channel Pacing Threshold Amplitude: 1.375 V
Lead Channel Pacing Threshold Pulse Width: 0.4 ms
Lead Channel Pacing Threshold Pulse Width: 0.4 ms
Lead Channel Sensing Intrinsic Amplitude: 14.375 mV
Lead Channel Sensing Intrinsic Amplitude: 14.375 mV
Lead Channel Sensing Intrinsic Amplitude: 2.875 mV
Lead Channel Sensing Intrinsic Amplitude: 2.875 mV
Lead Channel Setting Pacing Amplitude: 2.5 V
Lead Channel Setting Pacing Amplitude: 4 V
Lead Channel Setting Pacing Pulse Width: 1 ms
Lead Channel Setting Sensing Sensitivity: 1.2 mV

## 2020-02-01 ENCOUNTER — Telehealth: Payer: Self-pay

## 2020-02-01 NOTE — Telephone Encounter (Signed)
Provider page of Eliquis Pt Asst Application has been completed and placed in Dr Bruna Potter box for signature.

## 2020-02-06 NOTE — Progress Notes (Signed)
Remote pacemaker transmission.   

## 2020-02-11 NOTE — Telephone Encounter (Signed)
**Note De-Identified Jessey Huyett Obfuscation** I received a completed and signed MD page of a [employer] application [place] email.  Because I am unaware of where the pts part of the application is I called her and she states that she has mailed her part in and wants [place] to fax the MD page to [facility].  I am unsure why the signed MD page was emailed to me but I have emailed it to Dr Bruna Potter nurse along with a cover letter with [facility] fax number written so she can fax to [employer].

## 2020-02-29 NOTE — Telephone Encounter (Signed)
Patient called in and states that [employer] is stating they were missing the patient consent form and need the patient consent form refaxed to them. Please advise if anything additional needed from the patient

## 2020-02-29 NOTE — Telephone Encounter (Signed)
**Note De-Identified Deborah Moses Obfuscation** The pt is reminded that she was to mail her part of the [employer] application to them per our phone conversation on 02/11/2020.  She states that she thought she had but [employer] states that they have not received it. She states that they mailed her another application and that she is going to complete the patient page and will mail it to [employer] at the address written on the application.  She is aware to call Larita Fife at [phone] at Dr Bruna Potter office if she has any questions or concerns about her completing application.

## 2020-03-10 NOTE — Telephone Encounter (Signed)
**Note De-Identified Cari Vandeberg Obfuscation** Letter received from [facility] Patria Warzecha fax stating that they have approved the pt for asst with her Eliquis. Approval is valid until 02/14/2021 Application Case#: [id number]  The letter states that they have notified the pt of this approval as well.

## 2020-03-24 ENCOUNTER — Ambulatory Visit (INDEPENDENT_AMBULATORY_CARE_PROVIDER_SITE_OTHER)
Admission: RE | Admit: 2020-03-24 | Discharge: 2020-03-24 | Disposition: A | Discharge status: Home / Self Care | Payer: Medicare Other | Source: Ambulatory Visit | Attending: Physician Assistant | Admitting: Physician Assistant

## 2020-03-24 ENCOUNTER — Other Ambulatory Visit: Payer: Self-pay

## 2020-03-24 ENCOUNTER — Ambulatory Visit (HOSPITAL_COMMUNITY)
Admission: RE | Admit: 2020-03-24 | Discharge: 2020-03-24 | Disposition: A | Discharge status: Home / Self Care | Payer: Medicare Other | Source: Ambulatory Visit | Attending: Physician Assistant | Admitting: Physician Assistant

## 2020-03-24 ENCOUNTER — Ambulatory Visit (INDEPENDENT_AMBULATORY_CARE_PROVIDER_SITE_OTHER): Payer: Medicare Other | Admitting: Physician Assistant

## 2020-03-24 VITALS — BP 160/71 | HR 66 | Temp 98.5°F | Resp 20 | Ht 62.0 in | Wt 118.6 lb

## 2020-03-24 DIAGNOSIS — I10 Essential (primary) hypertension: Secondary | ICD-10-CM | POA: Insufficient documentation

## 2020-03-24 DIAGNOSIS — R42 Dizziness and giddiness: Secondary | ICD-10-CM | POA: Insufficient documentation

## 2020-03-24 DIAGNOSIS — I739 Peripheral vascular disease, unspecified: Secondary | ICD-10-CM | POA: Insufficient documentation

## 2020-03-24 DIAGNOSIS — B0223 Postherpetic polyneuropathy: Secondary | ICD-10-CM | POA: Insufficient documentation

## 2020-03-24 DIAGNOSIS — I714 Abdominal aortic aneurysm, without rupture, unspecified: Secondary | ICD-10-CM

## 2020-03-24 NOTE — Progress Notes (Signed)
Established EVAR   History of Present Illness   Deborah Moses is a 87 y.o. (08/24/1931) female who presents for routine follow up s/p EVAR (Date: 07/2019) by Dr. Myra Gianotti.  AAA was 5.2 cm at the time of repair.  She denies any new or changing abdominal or back pain.  She does claudicate in right lower extremity however has not been walking enough in the winter months to notice.  She denies any rest pain of bilateral lower extremities.  She has tenderness over her right lateral malleolus however no ulceration or skin breakdown.  She is on Eliquis for atrial fibrillation managed by her cardiologist Dr. Ladona Ridgel.  She denies tobacco use.  She also has a known descending thoracic aortic aneurysm measuring 4.4cm as of 06/2019 CTA.  The patient's PMH, PSH, SH, and FamHx were reviewed and are unchanged from prior visit.  Current Outpatient Medications  Medication Sig Dispense Refill  . acetaminophen (TYLENOL) 500 MG tablet Take 500 mg by mouth every 6 (six) hours as needed for moderate pain.     Marland Kitchen alendronate (FOSAMAX) 70 MG tablet Take 70 mg by mouth once a week. Take with a full glass of water on an empty stomach.    Marland Kitchen amLODipine (NORVASC) 5 MG tablet Take 5 mg by mouth daily.    Marland Kitchen apixaban (ELIQUIS) 2.5 MG TABS tablet Take 1 tablet (2.5 mg total) by mouth 2 (two) times daily. 180 tablet 3  . calcium-vitamin D (OSCAL WITH D) 500-200 MG-UNIT TABS tablet Take 1 tablet by mouth daily.     Marland Kitchen lisinopril (PRINIVIL,ZESTRIL) 20 MG tablet Take 40 mg by mouth daily.     Marland Kitchen lovastatin (MEVACOR) 20 MG tablet Take 20 mg by mouth at bedtime.    . metoprolol succinate (TOPROL-XL) 25 MG 24 hr tablet Take 25 mg by mouth daily.    . Omega-3 Fatty Acids (FISH OIL) 1000 MG CPDR Take 1,000 mg by mouth in the morning, at noon, and at bedtime.     Marland Kitchen oxyCODONE-acetaminophen (PERCOCET) 5-325 MG tablet Take 1 tablet by mouth every 6 (six) hours as needed. 8 tablet 0   No current facility-administered medications for this  visit.    REVIEW OF SYSTEMS (negative unless checked):   Cardiac:  []  Chest pain or chest pressure? []  Shortness of breath upon activity? []  Shortness of breath when lying flat? []  Irregular heart rhythm?  Vascular:  []  Pain in calf, thigh, or hip brought on by walking? []  Pain in feet at night that wakes you up from your sleep? []  Blood clot in your veins? []  Leg swelling?  Pulmonary:  []  Oxygen at home? []  Productive cough? []  Wheezing?  Neurologic:  []  Sudden weakness in arms or legs? []  Sudden numbness in arms or legs? []  Sudden onset of difficult speaking or slurred speech? []  Temporary loss of vision in one eye? []  Problems with dizziness?  Gastrointestinal:  []  Blood in stool? []  Vomited blood?  Genitourinary:  []  Burning when urinating? []  Blood in urine?  Psychiatric:  []  Major depression  Hematologic:  []  Bleeding problems? []  Problems with blood clotting?  Dermatologic:  []  Rashes or ulcers?  Constitutional:  []  Fever or chills?  Ear/Nose/Throat:  []  Change in hearing? []  Nose bleeds? []  Sore throat?  Musculoskeletal:  []  Back pain? []  Joint pain? []  Muscle pain?   Physical Examination   Vitals:   03/24/20 1016  BP: (!) 160/71  Pulse: 66  Resp: 20  Temp: 98.5 F (  36.9 C)  TempSrc: Temporal  SpO2: 97%  Weight: 118 lb 9.6 oz (53.8 kg)  Height: 5\' 2"  (1.575 m)   Body mass index is 21.69 kg/m.  General:  WDWN in NAD; vital signs documented above Gait: Not observed HENT: WNL, normocephalic Pulmonary: normal non-labored breathing , without Rales, rhonchi,  wheezing Cardiac: regular HR Abdomen: soft, NT, no masses Skin: without rashes Vascular Exam/Pulses:    Right Left  Radial 2+ (normal) 2+ (normal)  DP absent absent  PT absent absent   Extremities: Redness and tenderness over right lateral malleolus however no skin breakdown Musculoskeletal: no muscle wasting or atrophy  Neurologic: A&O X 3;  No focal weakness or  paresthesias are detected Psychiatric:  The pt has Normal affect.  Non-Invasive Vascular Imaging   EVAR Duplex   AAA sac size: 5 cm   no endoleak detected  ABI R 0.6 L 0.7   Medical Decision Making   Deborah Moses is a 87 y.o. female who presents for surveillance s/p EVAR   AAA sac size slightly decreased now measuring 5.07cm at maximal diameter  We will recheck EVAR duplex in 1 year per protocol  Patient with history of right lower extremity claudication; ABIs today demonstrating right ABI of 0.6 and left ABI of 0.7  Encouraged patient to walk as much as possible; no indication for further work-up as there is no tissue loss or rest pain  Patient also with known descending thoracic aortic aneurysm measuring 4.4 cm as of 06/2019; she will also need surveillance with CT for this at least annually   07/2019 PA-C [facility] [place] Office: [phone]  Clinic MD: [phone]

## 2020-04-07 DIAGNOSIS — E7849 Other hyperlipidemia: Secondary | ICD-10-CM | POA: Diagnosis not present

## 2020-04-07 DIAGNOSIS — M818 Other osteoporosis without current pathological fracture: Secondary | ICD-10-CM | POA: Diagnosis not present

## 2020-04-07 DIAGNOSIS — N182 Chronic kidney disease, stage 2 (mild): Secondary | ICD-10-CM | POA: Diagnosis not present

## 2020-04-07 DIAGNOSIS — I10 Essential (primary) hypertension: Secondary | ICD-10-CM | POA: Diagnosis not present

## 2020-04-24 ENCOUNTER — Ambulatory Visit (INDEPENDENT_AMBULATORY_CARE_PROVIDER_SITE_OTHER): Payer: Medicare Other

## 2020-04-24 DIAGNOSIS — I442 Atrioventricular block, complete: Secondary | ICD-10-CM | POA: Diagnosis not present

## 2020-04-27 LAB — CUP PACEART REMOTE DEVICE CHECK
Battery Remaining Longevity: 57 mo
Battery Voltage: 2.96 V
Brady Statistic AP VP Percent: 7.44 %
Brady Statistic AP VS Percent: 2.62 %
Brady Statistic AS VP Percent: 70.01 %
Brady Statistic AS VS Percent: 19.93 %
Brady Statistic RA Percent Paced: 11.12 %
Brady Statistic RV Percent Paced: 77.45 %
Date Time Interrogation Session: 20220310021225
Implantable Lead Implant Date: 20190528
Implantable Lead Implant Date: 20190528
Implantable Lead Location: 753859
Implantable Lead Location: 753860
Implantable Lead Model: 3830
Implantable Lead Model: 5076
Implantable Pulse Generator Implant Date: 20190528
Lead Channel Impedance Value: 342 Ohm
Lead Channel Impedance Value: 475 Ohm
Lead Channel Impedance Value: 513 Ohm
Lead Channel Impedance Value: 513 Ohm
Lead Channel Pacing Threshold Amplitude: 1.375 V
Lead Channel Pacing Threshold Amplitude: 2.375 V
Lead Channel Pacing Threshold Pulse Width: 0.4 ms
Lead Channel Pacing Threshold Pulse Width: 0.4 ms
Lead Channel Sensing Intrinsic Amplitude: 1.875 mV
Lead Channel Sensing Intrinsic Amplitude: 1.875 mV
Lead Channel Sensing Intrinsic Amplitude: 14.375 mV
Lead Channel Sensing Intrinsic Amplitude: 14.375 mV
Lead Channel Setting Pacing Amplitude: 2.5 V
Lead Channel Setting Pacing Amplitude: 4 V
Lead Channel Setting Pacing Pulse Width: 1 ms
Lead Channel Setting Sensing Sensitivity: 1.2 mV

## 2020-05-02 NOTE — Progress Notes (Signed)
Remote pacemaker transmission.   

## 2020-06-23 DIAGNOSIS — N182 Chronic kidney disease, stage 2 (mild): Secondary | ICD-10-CM | POA: Diagnosis not present

## 2020-06-23 DIAGNOSIS — E7849 Other hyperlipidemia: Secondary | ICD-10-CM | POA: Diagnosis not present

## 2020-06-23 DIAGNOSIS — I10 Essential (primary) hypertension: Secondary | ICD-10-CM | POA: Diagnosis not present

## 2020-06-23 DIAGNOSIS — M818 Other osteoporosis without current pathological fracture: Secondary | ICD-10-CM | POA: Diagnosis not present

## 2020-07-14 DIAGNOSIS — M818 Other osteoporosis without current pathological fracture: Secondary | ICD-10-CM | POA: Diagnosis not present

## 2020-07-14 DIAGNOSIS — I10 Essential (primary) hypertension: Secondary | ICD-10-CM | POA: Diagnosis not present

## 2020-07-14 DIAGNOSIS — N182 Chronic kidney disease, stage 2 (mild): Secondary | ICD-10-CM | POA: Diagnosis not present

## 2020-07-14 DIAGNOSIS — E7849 Other hyperlipidemia: Secondary | ICD-10-CM | POA: Diagnosis not present

## 2020-07-24 ENCOUNTER — Ambulatory Visit (INDEPENDENT_AMBULATORY_CARE_PROVIDER_SITE_OTHER): Payer: Medicare Other

## 2020-07-24 DIAGNOSIS — I442 Atrioventricular block, complete: Secondary | ICD-10-CM | POA: Diagnosis not present

## 2020-07-24 LAB — CUP PACEART REMOTE DEVICE CHECK
Battery Remaining Longevity: 49 mo
Battery Voltage: 2.95 V
Brady Statistic AP VP Percent: 5.37 %
Brady Statistic AP VS Percent: 3.6 %
Brady Statistic AS VP Percent: 53.58 %
Brady Statistic AS VS Percent: 37.44 %
Brady Statistic RA Percent Paced: 9.02 %
Brady Statistic RV Percent Paced: 59.13 %
Date Time Interrogation Session: 20220609031046
Implantable Lead Implant Date: 20190528
Implantable Lead Implant Date: 20190528
Implantable Lead Location: 753859
Implantable Lead Location: 753860
Implantable Lead Model: 3830
Implantable Lead Model: 5076
Implantable Pulse Generator Implant Date: 20190528
Lead Channel Impedance Value: 304 Ohm
Lead Channel Impedance Value: 361 Ohm
Lead Channel Impedance Value: 380 Ohm
Lead Channel Impedance Value: 475 Ohm
Lead Channel Pacing Threshold Amplitude: 1.25 V
Lead Channel Pacing Threshold Amplitude: 1.375 V
Lead Channel Pacing Threshold Pulse Width: 0.4 ms
Lead Channel Pacing Threshold Pulse Width: 0.4 ms
Lead Channel Sensing Intrinsic Amplitude: 1.625 mV
Lead Channel Sensing Intrinsic Amplitude: 1.625 mV
Lead Channel Sensing Intrinsic Amplitude: 16.25 mV
Lead Channel Sensing Intrinsic Amplitude: 16.25 mV
Lead Channel Setting Pacing Amplitude: 2.5 V
Lead Channel Setting Pacing Amplitude: 4 V
Lead Channel Setting Pacing Pulse Width: 1 ms
Lead Channel Setting Sensing Sensitivity: 1.2 mV

## 2020-08-04 DIAGNOSIS — M85852 Other specified disorders of bone density and structure, left thigh: Secondary | ICD-10-CM | POA: Diagnosis not present

## 2020-08-04 DIAGNOSIS — M810 Age-related osteoporosis without current pathological fracture: Secondary | ICD-10-CM | POA: Diagnosis not present

## 2020-08-04 DIAGNOSIS — Z780 Asymptomatic menopausal state: Secondary | ICD-10-CM | POA: Diagnosis not present

## 2020-08-15 NOTE — Progress Notes (Signed)
Remote pacemaker transmission.   

## 2020-08-19 ENCOUNTER — Ambulatory Visit (INDEPENDENT_AMBULATORY_CARE_PROVIDER_SITE_OTHER): Payer: Medicare Other | Admitting: Internal Medicine

## 2020-08-19 ENCOUNTER — Other Ambulatory Visit: Payer: Self-pay

## 2020-08-19 ENCOUNTER — Encounter: Payer: Self-pay | Admitting: Internal Medicine

## 2020-08-19 VITALS — BP 134/68 | HR 68 | Ht 61.0 in | Wt 114.4 lb

## 2020-08-19 DIAGNOSIS — I442 Atrioventricular block, complete: Secondary | ICD-10-CM | POA: Diagnosis not present

## 2020-08-19 DIAGNOSIS — Z950 Presence of cardiac pacemaker: Secondary | ICD-10-CM | POA: Diagnosis not present

## 2020-08-19 NOTE — Patient Instructions (Signed)
Medication Instructions:  Your physician recommends that you continue on your current medications as directed. Please refer to the Current Medication list given to you today.  *If you need a refill on your cardiac medications before your next appointment, please call your pharmacy*   Lab Work: None   If you have labs (blood work) drawn today and your tests are completely normal, you will receive your results only by: . MyChart Message (if you have MyChart) OR . A paper copy in the mail If you have any lab test that is abnormal or we need to change your treatment, we will call you to review the results.   Testing/Procedures: NONE    Follow-Up: At [facility], you and your health needs are our priority.  As part of our continuing mission to provide you with exceptional heart care, we have created designated Provider Care Teams.  These Care Teams include your primary Cardiologist (physician) and Advanced Practice Providers (APPs -  Physician Assistants and Nurse Practitioners) who all work together to provide you with the care you need, when you need it.  We recommend signing up for the patient portal called "MyChart".  Sign up information is provided on this After Visit Summary.  MyChart is used to connect with patients for Virtual Visits (Telemedicine).  Patients are able to view lab/test results, encounter notes, upcoming appointments, etc.  Non-urgent messages can be sent to your provider as well.   To learn more about what you can do with MyChart, go to [email or web address].    Your next appointment:   1 year(s)  The format for your next appointment:   In Person  Provider:   Gregg Taylor, MD   Other Instructions Thank you for choosing [facility]!    

## 2020-08-19 NOTE — Progress Notes (Signed)
HPI Deborah Moses returns today for followup. She is a pleasant 87 yo woman with CHB, s/p PPM insertion, peripheral vascular disease and AAA. She has had worsening in her AAA and is pending endovascular graft repair. She is limited by claudication. She denies chest pain or sob. No edema. She has some elevated pacing thresholds.  No Known Allergies   Current Outpatient Medications  Medication Sig Dispense Refill   acetaminophen (TYLENOL) 500 MG tablet Take 500 mg by mouth every 6 (six) hours as needed for moderate pain.      alendronate (FOSAMAX) 70 MG tablet Take 70 mg by mouth once a week. Take with a full glass of water on an empty stomach.     amLODipine (NORVASC) 5 MG tablet Take 5 mg by mouth daily.     apixaban (ELIQUIS) 2.5 MG TABS tablet Take 1 tablet (2.5 mg total) by mouth 2 (two) times daily. 180 tablet 3   calcium-vitamin D (OSCAL WITH D) 500-200 MG-UNIT TABS tablet Take 1 tablet by mouth daily.      lisinopril (PRINIVIL,ZESTRIL) 20 MG tablet Take 40 mg by mouth daily.      lovastatin (MEVACOR) 20 MG tablet Take 20 mg by mouth at bedtime.     metoprolol succinate (TOPROL-XL) 25 MG 24 hr tablet Take 25 mg by mouth daily.     Omega-3 Fatty Acids (FISH OIL) 1000 MG CPDR Take 1,000 mg by mouth in the morning, at noon, and at bedtime.      No current facility-administered medications for this visit.     Past Medical History:  Diagnosis Date   AAA (abdominal aortic aneurysm) (HCC)    Heart block AV third degree (HCC)    HTN (hypertension)    Hyperlipidemia    Vertigo     ROS:   All systems reviewed and negative except as noted in the HPI.   Past Surgical History:  Procedure Laterality Date   ABDOMINAL AORTIC ENDOVASCULAR STENT GRAFT N/A 08/01/2019   Procedure: ABDOMINAL AORTIC ENDOVASCULAR STENT GRAFT;  Surgeon: Nada Libman, MD;  Location: [facility] OR;  Service: Vascular;  Laterality: N/A;   APPENDECTOMY     per pt she might've gotten her appendix and gallbladder  out together because that was "standard procedure back then"   CHOLECYSTECTOMY     PACEMAKER IMPLANT N/A 07/12/2017   Procedure: PACEMAKER IMPLANT;  Surgeon: Marinus Maw, MD;  Location: [facility] INVASIVE CV LAB;  Service: Cardiovascular;  Laterality: N/A;   TONSILLECTOMY     ULTRASOUND GUIDANCE FOR VASCULAR ACCESS N/A 08/01/2019   Procedure: ULTRASOUND GUIDANCE FOR VASCULAR ACCESS;  Surgeon: Nada Libman, MD;  Location: [facility] OR;  Service: Vascular;  Laterality: N/A;     Family History  Problem Relation Age of Onset   Heart failure Father      Social History   Socioeconomic History   Marital status: Widowed    Spouse name: Not on file   Number of children: 4   Years of education: Not on file   Highest education level: Not on file  Occupational History   Not on file  Tobacco Use   Smoking status: Never   Smokeless tobacco: Never  Vaping Use   Vaping Use: Never used  Substance and Sexual Activity   Alcohol use: Never   Drug use: Never   Sexual activity: Not on file  Other Topics Concern   Not on file  Social History Narrative   Not on file  Social Determinants of Health   Financial Resource Strain: Not on file  Food Insecurity: Not on file  Transportation Needs: Not on file  Physical Activity: Not on file  Stress: Not on file  Social Connections: Not on file  Intimate Partner Violence: Not on file     BP 134/68   Pulse 68   Ht 5\' 1"  (1.549 m)   Wt 114 lb 6.4 oz (51.9 kg)   SpO2 98%   BMI 21.62 kg/m   Physical Exam:  Well appearing NAD HEENT: Unremarkable Neck:  No JVD, no thyromegally Lymphatics:  No adenopathy Back:  No CVA tenderness Lungs:  Clear HEART:  Regular rate rhythm, no murmurs, no rubs, no clicks Abd:  soft, positive bowel sounds, no organomegally, no rebound, no guarding Ext:  2 plus pulses, no edema, no cyanosis, no clubbing Skin:  No rashes no nodules Neuro:  CN II through XII intact, motor grossly intact  EKG - NSR with ventricular  pacing  DEVICE  Normal device function.  See PaceArt for details.   Assess/Plan:  1. CHB - she is asymptomatic, s/p PPM insertion. 2. PPM - her atrial threshold is elevated but she is not using her atrial lead. She will undergo watchful waiting. We adjusted her output today to maximize longevity 3. HTN - her bp is well controlled. 4. PAF - she is in NSR 99.9%. She will continue low dose eliquis.    .D.

## 2020-08-19 NOTE — Addendum Note (Signed)
Addended by: Kerney Elbe on: 08/19/2020 03:00 PM   Modules accepted: Orders

## 2020-08-25 DIAGNOSIS — M1710 Unilateral primary osteoarthritis, unspecified knee: Secondary | ICD-10-CM | POA: Diagnosis not present

## 2020-08-25 DIAGNOSIS — Z6821 Body mass index (BMI) 21.0-21.9, adult: Secondary | ICD-10-CM | POA: Diagnosis not present

## 2020-09-14 DIAGNOSIS — E7849 Other hyperlipidemia: Secondary | ICD-10-CM | POA: Diagnosis not present

## 2020-09-14 DIAGNOSIS — I10 Essential (primary) hypertension: Secondary | ICD-10-CM | POA: Diagnosis not present

## 2020-09-25 ENCOUNTER — Telehealth: Payer: Self-pay

## 2020-09-25 NOTE — Telephone Encounter (Signed)
Patient returning nurse call

## 2020-09-25 NOTE — Telephone Encounter (Signed)
"  Alert remote reviewed. Normal device function.   Known PAF, on OAC, AF burden is 0.7% of the time.  There is not good ventricular rate control with AF, rates are 100-160 bpm.  NSVT appear to be AF with RVR.  Sent to triage.   Next remote 10/23/2020. Hassell Halim, RN, CCDS, [employer]"  Remote transmission reviewed. AF with RVR onset appears to be 09/25/20 at 03:38 am and ongoing. EMR reviewed including last office note with Dr. Ladona Ridgel 08/19/20. Prescribed meds include Eliquis 2.5mg  bid and Toprol XL 25mg  daily. Discussed potential plan with A. Tillery, PA. If patient is symptomatic send to ED. If patient is not symptomatic and BP stable, increase metoprolol to 50mg  Q day and send to AF clinic at next available appointment.   Unsuccessful telephone call to patient and daughter (on ) to assess for s/s of RVR and medication compliance. [employer] compliant VM message left requesting call back to [phone] on both patient and daughter's given number.

## 2020-09-26 MED ORDER — METOPROLOL SUCCINATE ER 50 MG PO TB24: 50.0000 mg | ORAL_TABLET | Freq: Every day | ORAL | 0 refills | Status: DC

## 2020-09-26 NOTE — Telephone Encounter (Signed)
Spoke with pt.  She states her sister passed away earlier this week and she has been under increased stress lately.  She denies any new or changed cardiac symptoms.  Her BP has been stable, she reports the lowest BP she has ha lately the SBP was 115.  Advised pt to increase Metoprolol to 50mg  daily per , PA.  Advised pt of s/s to monitor for and report.

## 2020-09-29 ENCOUNTER — Telehealth: Payer: Self-pay

## 2020-09-29 DIAGNOSIS — R002 Palpitations: Secondary | ICD-10-CM | POA: Diagnosis not present

## 2020-09-29 DIAGNOSIS — Z681 Body mass index (BMI) 19 or less, adult: Secondary | ICD-10-CM | POA: Diagnosis not present

## 2020-09-29 DIAGNOSIS — I700 Atherosclerosis of aorta: Secondary | ICD-10-CM | POA: Diagnosis not present

## 2020-09-29 DIAGNOSIS — M1710 Unilateral primary osteoarthritis, unspecified knee: Secondary | ICD-10-CM | POA: Diagnosis not present

## 2020-09-29 NOTE — Telephone Encounter (Signed)
Patient left a message wanting to speak with a nurse. She did not say what it was about.

## 2020-10-07 ENCOUNTER — Other Ambulatory Visit: Payer: Self-pay

## 2020-10-07 ENCOUNTER — Encounter: Payer: Self-pay | Admitting: Internal Medicine

## 2020-10-07 ENCOUNTER — Ambulatory Visit (INDEPENDENT_AMBULATORY_CARE_PROVIDER_SITE_OTHER): Payer: Medicare Other | Admitting: Internal Medicine

## 2020-10-07 VITALS — BP 160/70 | HR 67 | Ht 61.0 in | Wt 108.8 lb

## 2020-10-07 DIAGNOSIS — I442 Atrioventricular block, complete: Secondary | ICD-10-CM | POA: Diagnosis not present

## 2020-10-07 DIAGNOSIS — I480 Paroxysmal atrial fibrillation: Secondary | ICD-10-CM

## 2020-10-07 DIAGNOSIS — I714 Abdominal aortic aneurysm, without rupture, unspecified: Secondary | ICD-10-CM

## 2020-10-07 DIAGNOSIS — I10 Essential (primary) hypertension: Secondary | ICD-10-CM | POA: Diagnosis not present

## 2020-10-07 NOTE — Progress Notes (Signed)
HPI Deborah Moses returns today for followup. She is a pleasant 87 yo woman with CHB, s/p PPM insertion, peripheral vascular disease and AAA. She has had worsening in her AAA and underwent endovascular repair. She is limited by claudication. She denies chest pain or sob. No edema. She has some elevated pacing thresholds. She is saddened by the loss of her sister.  No Known Allergies   Current Outpatient Medications  Medication Sig Dispense Refill   acetaminophen (TYLENOL) 500 MG tablet Take 500 mg by mouth every 6 (six) hours as needed for moderate pain.      alendronate (FOSAMAX) 70 MG tablet Take 70 mg by mouth once a week. Take with a full glass of water on an empty stomach.     amLODipine (NORVASC) 5 MG tablet Take 5 mg by mouth daily.     apixaban (ELIQUIS) 2.5 MG TABS tablet Take 1 tablet (2.5 mg total) by mouth 2 (two) times daily. 180 tablet 3   calcium-vitamin D (OSCAL WITH D) 500-200 MG-UNIT TABS tablet Take 1 tablet by mouth daily.      lisinopril (PRINIVIL,ZESTRIL) 20 MG tablet Take 40 mg by mouth daily.      lovastatin (MEVACOR) 20 MG tablet Take 20 mg by mouth at bedtime.     metoprolol succinate (TOPROL-XL) 50 MG 24 hr tablet Take 1 tablet (50 mg total) by mouth daily. 90 tablet 0   Omega-3 Fatty Acids (FISH OIL) 1000 MG CPDR Take 1,000 mg by mouth in the morning, at noon, and at bedtime.      No current facility-administered medications for this visit.     Past Medical History:  Diagnosis Date   AAA (abdominal aortic aneurysm) (HCC)    Heart block AV third degree (HCC)    HTN (hypertension)    Hyperlipidemia    Vertigo     ROS:   All systems reviewed and negative except as noted in the HPI.   Past Surgical History:  Procedure Laterality Date   ABDOMINAL AORTIC ENDOVASCULAR STENT GRAFT N/A 08/01/2019   Procedure: ABDOMINAL AORTIC ENDOVASCULAR STENT GRAFT;  Surgeon: Nada Libman, MD;  Location: [facility] OR;  Service: Vascular;  Laterality: N/A;    APPENDECTOMY     per pt she might've gotten her appendix and gallbladder out together because that was "standard procedure back then"   CHOLECYSTECTOMY     PACEMAKER IMPLANT N/A 07/12/2017   Procedure: PACEMAKER IMPLANT;  Surgeon: Marinus Maw, MD;  Location: [facility] INVASIVE CV LAB;  Service: Cardiovascular;  Laterality: N/A;   TONSILLECTOMY     ULTRASOUND GUIDANCE FOR VASCULAR ACCESS N/A 08/01/2019   Procedure: ULTRASOUND GUIDANCE FOR VASCULAR ACCESS;  Surgeon: Nada Libman, MD;  Location: [facility] OR;  Service: Vascular;  Laterality: N/A;     Family History  Problem Relation Age of Onset   Heart failure Father      Social History   Socioeconomic History   Marital status: Widowed    Spouse name: Not on file   Number of children: 4   Years of education: Not on file   Highest education level: Not on file  Occupational History   Not on file  Tobacco Use   Smoking status: Never   Smokeless tobacco: Never  Vaping Use   Vaping Use: Never used  Substance and Sexual Activity   Alcohol use: Never   Drug use: Never   Sexual activity: Not on file  Other Topics Concern   Not on  file  Social History Narrative   Not on file   Social Determinants of Health   Financial Resource Strain: Not on file  Food Insecurity: Not on file  Transportation Needs: Not on file  Physical Activity: Not on file  Stress: Not on file  Social Connections: Not on file  Intimate Partner Violence: Not on file     BP (!) 160/70   Pulse 67   Ht 5\' 1"  (1.549 m)   Wt 108 lb 12.8 oz (49.4 kg)   SpO2 96%   BMI 20.56 kg/m   Physical Exam:  elderly appearing NAD HEENT: Unremarkable Neck:  No JVD, no thyromegally Lymphatics:  No adenopathy Back:  No CVA tenderness Lungs:  Clear with no wheezes HEART:  Regular rate rhythm, no murmurs, no rubs, no clicks Abd:  soft, positive bowel sounds, no organomegally, no rebound, no guarding Ext:  1 plus pulses, no edema, no cyanosis, no clubbing Skin:  No  rashes no nodules Neuro:  CN II through XII intact, motor grossly intact  DEVICE  Normal device function.  See PaceArt for details.   Assess/Plan:  1. CHB - she is asymptomatic, s/p PPM insertion. 2. PPM - her atrial threshold is elevated but she is not using her atrial lead. She will undergo watchful waiting. We adjusted her output today to maximize longevity 3. HTN - her bp is well controlled. 4. PAF - she is in NSR 99.1%. She will continue low dose eliquis.    .D.

## 2020-10-07 NOTE — Patient Instructions (Signed)
Medication Instructions:  Your physician recommends that you continue on your current medications as directed. Please refer to the Current Medication list given to you today.  *If you need a refill on your cardiac medications before your next appointment, please call your pharmacy*   Lab Work: NONE   If you have labs (blood work) drawn today and your tests are completely normal, you will receive your results only by: . MyChart Message (if you have MyChart) OR . A paper copy in the mail If you have any lab test that is abnormal or we need to change your treatment, we will call you to review the results.   Testing/Procedures: NONE    Follow-Up: At [facility], you and your health needs are our priority.  As part of our continuing mission to provide you with exceptional heart care, we have created designated Provider Care Teams.  These Care Teams include your primary Cardiologist (physician) and Advanced Practice Providers (APPs -  Physician Assistants and Nurse Practitioners) who all work together to provide you with the care you need, when you need it.  We recommend signing up for the patient portal called "MyChart".  Sign up information is provided on this After Visit Summary.  MyChart is used to connect with patients for Virtual Visits (Telemedicine).  Patients are able to view lab/test results, encounter notes, upcoming appointments, etc.  Non-urgent messages can be sent to your provider as well.   To learn more about what you can do with MyChart, go to [email or web address].    Your next appointment:   1 year(s)  The format for your next appointment:   In Person  Provider:   Gregg Taylor, MD   Other Instructions Thank you for choosing [facility]!    

## 2020-10-15 DIAGNOSIS — M818 Other osteoporosis without current pathological fracture: Secondary | ICD-10-CM | POA: Diagnosis not present

## 2020-10-15 DIAGNOSIS — E7849 Other hyperlipidemia: Secondary | ICD-10-CM | POA: Diagnosis not present

## 2020-10-15 DIAGNOSIS — N182 Chronic kidney disease, stage 2 (mild): Secondary | ICD-10-CM | POA: Diagnosis not present

## 2020-10-15 DIAGNOSIS — I10 Essential (primary) hypertension: Secondary | ICD-10-CM | POA: Diagnosis not present

## 2020-10-20 DIAGNOSIS — N136 Pyonephrosis: Secondary | ICD-10-CM | POA: Diagnosis not present

## 2020-10-20 DIAGNOSIS — E785 Hyperlipidemia, unspecified: Secondary | ICD-10-CM | POA: Diagnosis not present

## 2020-10-20 DIAGNOSIS — N132 Hydronephrosis with renal and ureteral calculous obstruction: Secondary | ICD-10-CM | POA: Diagnosis not present

## 2020-10-20 DIAGNOSIS — R079 Chest pain, unspecified: Secondary | ICD-10-CM | POA: Diagnosis not present

## 2020-10-20 DIAGNOSIS — R519 Headache, unspecified: Secondary | ICD-10-CM | POA: Diagnosis not present

## 2020-10-20 DIAGNOSIS — Z950 Presence of cardiac pacemaker: Secondary | ICD-10-CM | POA: Diagnosis not present

## 2020-10-20 DIAGNOSIS — H532 Diplopia: Secondary | ICD-10-CM | POA: Diagnosis not present

## 2020-10-20 DIAGNOSIS — N390 Urinary tract infection, site not specified: Secondary | ICD-10-CM | POA: Diagnosis not present

## 2020-10-20 DIAGNOSIS — G44209 Tension-type headache, unspecified, not intractable: Secondary | ICD-10-CM | POA: Diagnosis not present

## 2020-10-20 DIAGNOSIS — Z20822 Contact with and (suspected) exposure to covid-19: Secondary | ICD-10-CM | POA: Diagnosis not present

## 2020-10-20 DIAGNOSIS — D72829 Elevated white blood cell count, unspecified: Secondary | ICD-10-CM | POA: Diagnosis not present

## 2020-10-20 DIAGNOSIS — I10 Essential (primary) hypertension: Secondary | ICD-10-CM | POA: Diagnosis not present

## 2020-10-20 DIAGNOSIS — N202 Calculus of kidney with calculus of ureter: Secondary | ICD-10-CM | POA: Diagnosis not present

## 2020-10-20 DIAGNOSIS — H9190 Unspecified hearing loss, unspecified ear: Secondary | ICD-10-CM | POA: Diagnosis not present

## 2020-10-20 DIAGNOSIS — I160 Hypertensive urgency: Secondary | ICD-10-CM | POA: Diagnosis not present

## 2020-10-20 DIAGNOSIS — I714 Abdominal aortic aneurysm, without rupture: Secondary | ICD-10-CM | POA: Diagnosis not present

## 2020-10-20 DIAGNOSIS — N179 Acute kidney failure, unspecified: Secondary | ICD-10-CM | POA: Diagnosis not present

## 2020-10-20 DIAGNOSIS — I712 Thoracic aortic aneurysm, without rupture: Secondary | ICD-10-CM | POA: Diagnosis not present

## 2020-10-23 ENCOUNTER — Ambulatory Visit (INDEPENDENT_AMBULATORY_CARE_PROVIDER_SITE_OTHER): Payer: Medicare Other

## 2020-10-23 DIAGNOSIS — I442 Atrioventricular block, complete: Secondary | ICD-10-CM

## 2020-10-23 LAB — CUP PACEART REMOTE DEVICE CHECK
Battery Remaining Longevity: 57 mo
Battery Voltage: 2.96 V
Brady Statistic AP VP Percent: 15.67 %
Brady Statistic AP VS Percent: 3.21 %
Brady Statistic AS VP Percent: 35.85 %
Brady Statistic AS VS Percent: 45.28 %
Brady Statistic RA Percent Paced: 18.72 %
Brady Statistic RV Percent Paced: 51.52 %
Date Time Interrogation Session: 20220908030044
Implantable Lead Implant Date: 20190528
Implantable Lead Implant Date: 20190528
Implantable Lead Location: 753859
Implantable Lead Location: 753860
Implantable Lead Model: 3830
Implantable Lead Model: 5076
Implantable Pulse Generator Implant Date: 20190528
Lead Channel Impedance Value: 361 Ohm
Lead Channel Impedance Value: 399 Ohm
Lead Channel Impedance Value: 456 Ohm
Lead Channel Impedance Value: 456 Ohm
Lead Channel Pacing Threshold Amplitude: 1.25 V
Lead Channel Pacing Threshold Amplitude: 1.375 V
Lead Channel Pacing Threshold Pulse Width: 0.4 ms
Lead Channel Pacing Threshold Pulse Width: 0.4 ms
Lead Channel Sensing Intrinsic Amplitude: 16.75 mV
Lead Channel Sensing Intrinsic Amplitude: 16.75 mV
Lead Channel Sensing Intrinsic Amplitude: 3 mV
Lead Channel Sensing Intrinsic Amplitude: 3 mV
Lead Channel Setting Pacing Amplitude: 2.5 V
Lead Channel Setting Pacing Amplitude: 4 V
Lead Channel Setting Pacing Pulse Width: 0.6 ms
Lead Channel Setting Sensing Sensitivity: 1.2 mV

## 2020-10-29 DIAGNOSIS — Z681 Body mass index (BMI) 19 or less, adult: Secondary | ICD-10-CM | POA: Diagnosis not present

## 2020-10-29 DIAGNOSIS — R109 Unspecified abdominal pain: Secondary | ICD-10-CM | POA: Diagnosis not present

## 2020-10-29 DIAGNOSIS — N200 Calculus of kidney: Secondary | ICD-10-CM | POA: Diagnosis not present

## 2020-10-31 NOTE — Progress Notes (Signed)
Remote pacemaker transmission.   

## 2020-11-25 DIAGNOSIS — Z23 Encounter for immunization: Secondary | ICD-10-CM | POA: Diagnosis not present

## 2020-12-15 DIAGNOSIS — I10 Essential (primary) hypertension: Secondary | ICD-10-CM | POA: Diagnosis not present

## 2020-12-15 DIAGNOSIS — E7849 Other hyperlipidemia: Secondary | ICD-10-CM | POA: Diagnosis not present

## 2021-01-12 DIAGNOSIS — I700 Atherosclerosis of aorta: Secondary | ICD-10-CM | POA: Diagnosis not present

## 2021-01-12 DIAGNOSIS — I10 Essential (primary) hypertension: Secondary | ICD-10-CM | POA: Diagnosis not present

## 2021-01-12 DIAGNOSIS — E7849 Other hyperlipidemia: Secondary | ICD-10-CM | POA: Diagnosis not present

## 2021-01-12 DIAGNOSIS — M1710 Unilateral primary osteoarthritis, unspecified knee: Secondary | ICD-10-CM | POA: Diagnosis not present

## 2021-01-14 DIAGNOSIS — E7849 Other hyperlipidemia: Secondary | ICD-10-CM | POA: Diagnosis not present

## 2021-01-14 DIAGNOSIS — I700 Atherosclerosis of aorta: Secondary | ICD-10-CM | POA: Diagnosis not present

## 2021-01-14 DIAGNOSIS — N182 Chronic kidney disease, stage 2 (mild): Secondary | ICD-10-CM | POA: Diagnosis not present

## 2021-01-14 DIAGNOSIS — I10 Essential (primary) hypertension: Secondary | ICD-10-CM | POA: Diagnosis not present

## 2021-01-22 ENCOUNTER — Ambulatory Visit (INDEPENDENT_AMBULATORY_CARE_PROVIDER_SITE_OTHER): Payer: Medicare Other

## 2021-01-22 DIAGNOSIS — I442 Atrioventricular block, complete: Secondary | ICD-10-CM | POA: Diagnosis not present

## 2021-01-22 LAB — CUP PACEART REMOTE DEVICE CHECK
Battery Remaining Longevity: 47 mo
Battery Voltage: 2.95 V
Brady Statistic AP VP Percent: 15.9 %
Brady Statistic AP VS Percent: 0.12 %
Brady Statistic AS VP Percent: 83.81 %
Brady Statistic AS VS Percent: 0.17 %
Brady Statistic RA Percent Paced: 15.9 %
Brady Statistic RV Percent Paced: 99.71 %
Date Time Interrogation Session: 20221208021158
Implantable Lead Implant Date: 20190528
Implantable Lead Implant Date: 20190528
Implantable Lead Location: 753859
Implantable Lead Location: 753860
Implantable Lead Model: 3830
Implantable Lead Model: 5076
Implantable Pulse Generator Implant Date: 20190528
Lead Channel Impedance Value: 342 Ohm
Lead Channel Impedance Value: 361 Ohm
Lead Channel Impedance Value: 399 Ohm
Lead Channel Impedance Value: 475 Ohm
Lead Channel Pacing Threshold Amplitude: 1.25 V
Lead Channel Pacing Threshold Amplitude: 1.375 V
Lead Channel Pacing Threshold Pulse Width: 0.4 ms
Lead Channel Pacing Threshold Pulse Width: 0.4 ms
Lead Channel Sensing Intrinsic Amplitude: 16.375 mV
Lead Channel Sensing Intrinsic Amplitude: 16.375 mV
Lead Channel Sensing Intrinsic Amplitude: 2.75 mV
Lead Channel Sensing Intrinsic Amplitude: 2.75 mV
Lead Channel Setting Pacing Amplitude: 2.5 V
Lead Channel Setting Pacing Amplitude: 4 V
Lead Channel Setting Pacing Pulse Width: 0.6 ms
Lead Channel Setting Sensing Sensitivity: 1.2 mV

## 2021-01-30 NOTE — Progress Notes (Signed)
Remote pacemaker transmission.   

## 2021-02-02 ENCOUNTER — Telehealth: Payer: Self-pay

## 2021-02-02 NOTE — Telephone Encounter (Signed)
Provider page of Pt Asst Application was given to me by Roney Mans, RN. I have completed the application, Dr Ladona Ridgel has signed and it has been faxed to [employer].

## 2021-02-24 NOTE — Telephone Encounter (Signed)
**Note De-Identified Deborah Moses Obfuscation** Letter received from [facility] Ambika Zettlemoyer fax stating that they have approved the pt for asst with Eliquis until 02/14/2022. Application Case #: [id number]  The letter states that they have also advised the pt of this approval as well.

## 2021-04-13 ENCOUNTER — Other Ambulatory Visit: Payer: Self-pay

## 2021-04-13 DIAGNOSIS — M1710 Unilateral primary osteoarthritis, unspecified knee: Secondary | ICD-10-CM | POA: Diagnosis not present

## 2021-04-13 DIAGNOSIS — I714 Abdominal aortic aneurysm, without rupture, unspecified: Secondary | ICD-10-CM

## 2021-04-13 DIAGNOSIS — I700 Atherosclerosis of aorta: Secondary | ICD-10-CM | POA: Diagnosis not present

## 2021-04-13 DIAGNOSIS — R5381 Other malaise: Secondary | ICD-10-CM | POA: Diagnosis not present

## 2021-04-13 DIAGNOSIS — I739 Peripheral vascular disease, unspecified: Secondary | ICD-10-CM

## 2021-04-13 DIAGNOSIS — E7849 Other hyperlipidemia: Secondary | ICD-10-CM | POA: Diagnosis not present

## 2021-04-13 DIAGNOSIS — I4891 Unspecified atrial fibrillation: Secondary | ICD-10-CM | POA: Diagnosis not present

## 2021-04-13 DIAGNOSIS — Z0000 Encounter for general adult medical examination without abnormal findings: Secondary | ICD-10-CM | POA: Diagnosis not present

## 2021-04-13 DIAGNOSIS — I10 Essential (primary) hypertension: Secondary | ICD-10-CM | POA: Diagnosis not present

## 2021-04-13 NOTE — Addendum Note (Signed)
Addended byWaynetta Pean on: 04/13/2021 08:06 AM   Modules accepted: Orders

## 2021-04-20 ENCOUNTER — Other Ambulatory Visit: Payer: Self-pay

## 2021-04-20 ENCOUNTER — Ambulatory Visit (INDEPENDENT_AMBULATORY_CARE_PROVIDER_SITE_OTHER)
Admission: RE | Admit: 2021-04-20 | Discharge: 2021-04-20 | Disposition: A | Discharge status: Home / Self Care | Payer: Medicare Other | Source: Ambulatory Visit | Attending: Surgery | Admitting: Surgery

## 2021-04-20 ENCOUNTER — Other Ambulatory Visit: Payer: Self-pay | Admitting: *Deleted

## 2021-04-20 ENCOUNTER — Ambulatory Visit (HOSPITAL_COMMUNITY)
Admission: RE | Admit: 2021-04-20 | Discharge: 2021-04-20 | Disposition: A | Discharge status: Home / Self Care | Payer: Medicare Other | Source: Ambulatory Visit | Attending: Surgery | Admitting: Surgery

## 2021-04-20 ENCOUNTER — Ambulatory Visit: Payer: Medicare Other | Admitting: Physician Assistant

## 2021-04-20 VITALS — BP 143/67 | HR 58 | Temp 97.2°F | Resp 14 | Ht 62.0 in | Wt 102.0 lb

## 2021-04-20 DIAGNOSIS — Z01812 Encounter for preprocedural laboratory examination: Secondary | ICD-10-CM

## 2021-04-20 DIAGNOSIS — I714 Abdominal aortic aneurysm, without rupture, unspecified: Secondary | ICD-10-CM

## 2021-04-20 DIAGNOSIS — I739 Peripheral vascular disease, unspecified: Secondary | ICD-10-CM

## 2021-04-20 DIAGNOSIS — I712 Thoracic aortic aneurysm, without rupture, unspecified: Secondary | ICD-10-CM

## 2021-04-20 NOTE — Progress Notes (Signed)
[facility] [place] HISTORY AND PHYSICAL   History of Present Illness:  Patient is a 88 y.o. year old female who presents for evaluation of abdominal and thoracic aortic aneurysms.  She is s/p  EVAR (Date: 07/2019) by Dr. Myra Gianotti.  AAA was 5.2 cm at the time of repair.   She also has a known descending thoracic aortic aneurysm measuring 4.4cm as of 06/2019 CTA.  She remains asymptomatic for back pain or abdominal pain.  She states she has balance issues in her yard, but has no issues when she is walking in her house.  She denies claudication, rest pain or non healing wounds.    She has a history of PAF and is managed on Eliquis.  She is not on ASA or Plavix at this time.    Past Medical History:  Diagnosis Date   AAA (abdominal aortic aneurysm)    Heart block AV third degree (HCC)    HTN (hypertension)    Hyperlipidemia    Vertigo     Past Surgical History:  Procedure Laterality Date   ABDOMINAL AORTIC ENDOVASCULAR STENT GRAFT N/A 08/01/2019   Procedure: ABDOMINAL AORTIC ENDOVASCULAR STENT GRAFT;  Surgeon: Nada Libman, MD;  Location: [facility] OR;  Service: Vascular;  Laterality: N/A;   APPENDECTOMY     per pt she might've gotten her appendix and gallbladder out together because that was "standard procedure back then"   CHOLECYSTECTOMY     PACEMAKER IMPLANT N/A 07/12/2017   Procedure: PACEMAKER IMPLANT;  Surgeon: Marinus Maw, MD;  Location: [facility] INVASIVE CV LAB;  Service: Cardiovascular;  Laterality: N/A;   TONSILLECTOMY     ULTRASOUND GUIDANCE FOR VASCULAR ACCESS N/A 08/01/2019   Procedure: ULTRASOUND GUIDANCE FOR VASCULAR ACCESS;  Surgeon: Nada Libman, MD;  Location: [facility] OR;  Service: Vascular;  Laterality: N/A;     Social History Social History   Tobacco Use   Smoking status: Never   Smokeless tobacco: Never  Vaping Use   Vaping Use: Never used  Substance Use Topics   Alcohol use: Never   Drug use: Never    Family History Family History  Problem  Relation Age of Onset   Heart failure Father     Allergies  No Known Allergies   Current Outpatient Medications  Medication Sig Dispense Refill   acetaminophen (TYLENOL) 500 MG tablet Take 500 mg by mouth every 6 (six) hours as needed for moderate pain.      alendronate (FOSAMAX) 70 MG tablet Take 70 mg by mouth once a week. Take with a full glass of water on an empty stomach.     amLODipine (NORVASC) 5 MG tablet Take 5 mg by mouth daily.     apixaban (ELIQUIS) 2.5 MG TABS tablet Take 1 tablet (2.5 mg total) by mouth 2 (two) times daily. 180 tablet 3   calcium-vitamin D (OSCAL WITH D) 500-200 MG-UNIT TABS tablet Take 1 tablet by mouth daily.      lisinopril (PRINIVIL,ZESTRIL) 20 MG tablet Take 40 mg by mouth daily.      lovastatin (MEVACOR) 20 MG tablet Take 20 mg by mouth at bedtime.     metoprolol succinate (TOPROL-XL) 50 MG 24 hr tablet Take 1 tablet (50 mg total) by mouth daily. 90 tablet 0   Omega-3 Fatty Acids (FISH OIL) 1000 MG CPDR Take 1,000 mg by mouth in the morning, at noon, and at bedtime.      No current facility-administered medications for this visit.  ROS:   General:  No weight loss, Fever, chills  HEENT: No recent headaches, no nasal bleeding, no visual changes, no sore throat  Neurologic: No dizziness, blackouts, seizures. No recent symptoms of stroke or mini- stroke. No recent episodes of slurred speech, or temporary blindness.  Cardiac: No recent episodes of chest pain/pressure, no shortness of breath at rest.  No shortness of breath with exertion.  positive history of atrial fibrillation or irregular heartbeat  Vascular: No history of rest pain in feet.  No history of claudication.  No history of non-healing ulcer, No history of DVT   Pulmonary: No home oxygen, no productive cough, no hemoptysis,  No asthma or wheezing  Musculoskeletal:  [x ] Arthritis, [ ]  Low back pain,  [ ]  Joint pain  Hematologic:No history of hypercoagulable state.  No history of  easy bleeding.  No history of anemia  Gastrointestinal: No hematochezia or melena,  No gastroesophageal reflux, no trouble swallowing  Urinary: [ ]  chronic Kidney disease, [ ]  on HD - [ ]  MWF or [ ]  TTHS, [ ]  Burning with urination, [ ]  Frequent urination, [ ]  Difficulty urinating;   Skin: No rashes  Psychological: No history of anxiety,  No history of depression   Physical Examination  Vitals:   04/20/21 0952  BP: (!) 143/67  Pulse: (!) 58  Resp: 14  Temp: (!) 97.2 F (36.2 C)  TempSrc: Temporal  SpO2: 98%  Weight: 102 lb (46.3 kg)  Height: 5\' 2"  (1.575 m)    Body mass index is 18.66 kg/m.  General:  Alert and oriented, no acute distress HEENT: Normal Neck: No bruit or JVD Pulmonary: Clear to auscultation bilaterally Cardiac: Regular Rate and Rhythm without murmur Gastrointestinal: Soft, non-tender, non-distended, no mass, no scars Skin: No rash, no ischemic changes Musculoskeletal: No deformity or edema  Neurologic: Upper and lower extremity motor 5/5 and symmetric  DATA:  Endovascular Aortic Repair (EVAR):  +----------+----------------+-------------------+-------------------+------  --+              Diameter AP (cm) Diameter Trans (cm) Velocities  (cm/sec) Comments   +----------+----------------+-------------------+-------------------+------  --+   Aorta      5.27             5.87                66                               +----------+----------------+-------------------+-------------------+------  --+   Right Limb 1.67             1.56                49                   proximal   +----------+----------------+-------------------+-------------------+------  --+   Left Limb  1.55             1.61                43                   proximal   +----------+----------------+-------------------+-------------------+------  --+   Summary:  Abdominal Aorta: Patent endovascular aneurysm repair with no evidence of  endoleak. Previous diameter measurement  was 4.86 x 5.09 cm obtained on  03/24/20.      ABI Findings:  +---------+------------------+-----+----------+--------+   Right     Rt Pressure (mmHg) Index  Waveform   Comment    +---------+------------------+-----+----------+--------+   Brachial  179                                            +---------+------------------+-----+----------+--------+   PTA       100                0.56  monophasic            +---------+------------------+-----+----------+--------+   DP        87                 0.49  monophasic            +---------+------------------+-----+----------+--------+   Great Toe 66                 0.37                        +---------+------------------+-----+----------+--------+   +---------+------------------+-----+----------+-------+   Left      Lt Pressure (mmHg) Index Waveform   Comment   +---------+------------------+-----+----------+-------+   Brachial  175                                           +---------+------------------+-----+----------+-------+   PTA       68                 0.38  monophasic           +---------+------------------+-----+----------+-------+   DP        81                 0.45  monophasic           +---------+------------------+-----+----------+-------+   Great Toe 44                 0.25                       +---------+------------------+-----+----------+-------+   +-------+-----------+-----------+------------+------------+   ABI/TBI Today's ABI Today's TBI Previous ABI Previous TBI   +-------+-----------+-----------+------------+------------+   Right   0.56        0.37        0.57         0.34           +-------+-----------+-----------+------------+------------+   Left    0.45        0.25        0.71         0.35           +-------+-----------+-----------+------------+------------+       Previous ABI on 03/24/20.     Summary:  Right: Resting right ankle-brachial index indicates moderate right lower  extremity arterial disease. The  right toe-brachial index is abnormal.   Left: Resting left ankle-brachial index indicates severe left lower  extremity arterial disease. The left toe-brachial index is abnormal.   ASSESSMENT/PLAN: AAA s/p EVAR and thoracic aneurysm Asymptomatic.   She is ambulatory in her home without claudication, rest pain or non healing wounds.  The EVAR duplex demonstrated that the Aorta is increasing in size now measuring 5.87 trans diameter with previous 5.09.  The duplex shows no evidence of Endoleak.  Her thoracic aneurysm was last  evaluated  measuring 4.4cm as of 06/2019 CTA.  We will plan for CTA chest/abdomin/pelvis and she will f/u with Dr. Myra Gianotti.       Mosetta Pigeon PA-C [facility] [place] Office: [phone]  MD in clinic [place]

## 2021-04-22 ENCOUNTER — Other Ambulatory Visit: Payer: Self-pay

## 2021-04-22 DIAGNOSIS — I714 Abdominal aortic aneurysm, without rupture, unspecified: Secondary | ICD-10-CM

## 2021-04-23 ENCOUNTER — Ambulatory Visit (INDEPENDENT_AMBULATORY_CARE_PROVIDER_SITE_OTHER): Payer: Medicare Other

## 2021-04-23 DIAGNOSIS — I442 Atrioventricular block, complete: Secondary | ICD-10-CM

## 2021-04-23 LAB — CUP PACEART REMOTE DEVICE CHECK
Battery Remaining Longevity: 43 mo
Battery Voltage: 2.95 V
Brady Statistic AP VP Percent: 12.5 %
Brady Statistic AP VS Percent: 0.18 %
Brady Statistic AS VP Percent: 86.58 %
Brady Statistic AS VS Percent: 0.75 %
Brady Statistic RA Percent Paced: 12.58 %
Brady Statistic RV Percent Paced: 99.08 %
Date Time Interrogation Session: 20230309021358
Implantable Lead Implant Date: 20190528
Implantable Lead Implant Date: 20190528
Implantable Lead Location: 753859
Implantable Lead Location: 753860
Implantable Lead Model: 3830
Implantable Lead Model: 5076
Implantable Pulse Generator Implant Date: 20190528
Lead Channel Impedance Value: 342 Ohm
Lead Channel Impedance Value: 361 Ohm
Lead Channel Impedance Value: 399 Ohm
Lead Channel Impedance Value: 475 Ohm
Lead Channel Pacing Threshold Amplitude: 1.125 V
Lead Channel Pacing Threshold Amplitude: 1.375 V
Lead Channel Pacing Threshold Pulse Width: 0.4 ms
Lead Channel Pacing Threshold Pulse Width: 0.4 ms
Lead Channel Sensing Intrinsic Amplitude: 1.875 mV
Lead Channel Sensing Intrinsic Amplitude: 1.875 mV
Lead Channel Sensing Intrinsic Amplitude: 15.5 mV
Lead Channel Sensing Intrinsic Amplitude: 15.5 mV
Lead Channel Setting Pacing Amplitude: 2.5 V
Lead Channel Setting Pacing Amplitude: 4 V
Lead Channel Setting Pacing Pulse Width: 0.6 ms
Lead Channel Setting Sensing Sensitivity: 1.2 mV

## 2021-05-06 ENCOUNTER — Ambulatory Visit (HOSPITAL_COMMUNITY)
Admission: RE | Admit: 2021-05-06 | Discharge: 2021-05-06 | Disposition: A | Discharge status: Home / Self Care | Payer: Medicare Other | Source: Ambulatory Visit | Attending: Surgery | Admitting: Surgery

## 2021-05-06 ENCOUNTER — Other Ambulatory Visit: Payer: Self-pay

## 2021-05-06 DIAGNOSIS — I7143 Infrarenal abdominal aortic aneurysm, without rupture: Secondary | ICD-10-CM | POA: Diagnosis not present

## 2021-05-06 DIAGNOSIS — Z9049 Acquired absence of other specified parts of digestive tract: Secondary | ICD-10-CM | POA: Diagnosis not present

## 2021-05-06 DIAGNOSIS — K5730 Diverticulosis of large intestine without perforation or abscess without bleeding: Secondary | ICD-10-CM | POA: Diagnosis not present

## 2021-05-06 DIAGNOSIS — I714 Abdominal aortic aneurysm, without rupture, unspecified: Secondary | ICD-10-CM | POA: Diagnosis not present

## 2021-05-06 DIAGNOSIS — I712 Thoracic aortic aneurysm, without rupture, unspecified: Secondary | ICD-10-CM | POA: Diagnosis not present

## 2021-05-06 LAB — POCT I-STAT CREATININE: Creatinine, Ser: 1.6 mg/dL — ABNORMAL HIGH (ref 0.44–1.00)

## 2021-05-06 MED ORDER — SODIUM CHLORIDE (PF) 0.9 % IJ SOLN
INTRAMUSCULAR | Status: AC
  Filled 2021-05-06: qty 50

## 2021-05-06 MED ORDER — IOHEXOL 350 MG/ML SOLN
100.0000 mL | Freq: Once | INTRAVENOUS | Status: AC | PRN
  Administered 2021-05-06: 80 mL via INTRAVENOUS

## 2021-05-06 NOTE — Progress Notes (Signed)
Remote pacemaker transmission.   

## 2021-05-11 ENCOUNTER — Other Ambulatory Visit: Payer: Self-pay

## 2021-05-11 ENCOUNTER — Encounter: Payer: Self-pay | Admitting: Surgery

## 2021-05-11 ENCOUNTER — Ambulatory Visit: Payer: Medicare Other | Admitting: Surgery

## 2021-05-11 VITALS — BP 161/81 | HR 63 | Temp 97.3°F | Resp 20 | Ht 62.0 in | Wt 104.0 lb

## 2021-05-11 DIAGNOSIS — I70213 Atherosclerosis of native arteries of extremities with intermittent claudication, bilateral legs: Secondary | ICD-10-CM

## 2021-05-11 DIAGNOSIS — I7123 Aneurysm of the descending thoracic aorta, without rupture: Secondary | ICD-10-CM | POA: Diagnosis not present

## 2021-05-11 DIAGNOSIS — I7143 Infrarenal abdominal aortic aneurysm, without rupture: Secondary | ICD-10-CM

## 2021-05-11 NOTE — Progress Notes (Signed)
? ?[facility] [place] ? ?Patient name: Deborah Moses MRN: [id number] DOB: [birth year] Sex: female ? ? ?REASON FOR VISIT:  ? ? ?Follow up ? ?HISOTRY OF PRESENT ILLNESS:  ? ?Deborah Moses is a 88 y.o. female who is status post endovascular repair of an abdominal aortic aneurysm with maximum diameter of 5.2 cm on 08/01/2019.  Her postoperative course was uncomplicated.  She also has a known thoracic aneurysm.  She was recently see by the PA and her AAA had increased in size to 5.87 cm by ultrasound.  She was sent for CTA to evaluate her AAA and TAAA. ? ?She has diminished ABI's in the 0.5 range.  She does have claudication symptoms when she walks outside however mostly she is inside and does not have calf cramping.  She does note a small pressure sore on the outside of her right ankle. ? ?Patient has a pacemaker for symptomatic bradycardia and third-degree heart block.  She is a non-smoker.  She is medically managed for hypertension.  She takes a statin for hypercholesterolemia.  She is on Eliquis for AFIB ? ? ?PAST MEDICAL HISTORY:  ? ?Past Medical History:  ?Diagnosis Date  ? AAA (abdominal aortic aneurysm)   ? Heart block AV third degree (HCC)   ? HTN (hypertension)   ? Hyperlipidemia   ? Vertigo   ? ? ? ?FAMILY HISTORY:  ? ?Family History  ?Problem Relation Age of Onset  ? Heart failure Father   ? ? ?SOCIAL HISTORY:  ? ?Social History  ? ?Tobacco Use  ? Smoking status: Never  ? Smokeless tobacco: Never  ?Substance Use Topics  ? Alcohol use: Never  ? ? ? ?ALLERGIES:  ? ?No Known Allergies ? ? ?CURRENT MEDICATIONS:  ? ?Current Outpatient Medications  ?Medication Sig Dispense Refill  ? acetaminophen (TYLENOL) 500 MG tablet Take 500 mg by mouth every 6 (six) hours as needed for moderate pain.     ? alendronate (FOSAMAX) 70 MG tablet Take 70 mg by mouth once a week. Take with a full glass of water on an empty stomach.    ? amLODipine (NORVASC) 5 MG tablet Take 5  mg by mouth daily.    ? apixaban (ELIQUIS) 2.5 MG TABS tablet Take 1 tablet (2.5 mg total) by mouth 2 (two) times daily. 180 tablet 3  ? calcium-vitamin D (OSCAL WITH D) 500-200 MG-UNIT TABS tablet Take 1 tablet by mouth daily.     ? lisinopril (PRINIVIL,ZESTRIL) 20 MG tablet Take 40 mg by mouth daily.     ? lovastatin (MEVACOR) 20 MG tablet Take 20 mg by mouth at bedtime.    ? metoprolol succinate (TOPROL-XL) 50 MG 24 hr tablet Take 1 tablet (50 mg total) by mouth daily. 90 tablet 0  ? Omega-3 Fatty Acids (FISH OIL) 1000 MG CPDR Take 1,000 mg by mouth in the morning, at noon, and at bedtime.     ? ?No current facility-administered medications for this visit.  ? ? ?REVIEW OF SYSTEMS:  ? ?[X]  denotes positive finding, [ ]  denotes negative finding ?Cardiac  Comments:  ?Chest pain or chest pressure:    ?Shortness of breath upon exertion:    ?Short of breath when lying flat:    ?Irregular heart rhythm:    ?    ?Vascular    ?Pain in calf, thigh, or hip brought on by ambulation: x   ?Pain in feet at night that wakes you up from your sleep:     ?Blood  clot in your veins:    ?Leg swelling:     ?    ?Pulmonary    ?Oxygen at home:    ?Productive cough:     ?Wheezing:     ?    ?Neurologic    ?Sudden weakness in arms or legs:     ?Sudden numbness in arms or legs:     ?Sudden onset of difficulty speaking or slurred speech:    ?Temporary loss of vision in one eye:     ?Problems with dizziness:     ?    ?Gastrointestinal    ?Blood in stool:     ?Vomited blood:     ?    ?Genitourinary    ?Burning when urinating:     ?Blood in urine:    ?    ?Psychiatric    ?Major depression:     ?    ?Hematologic    ?Bleeding problems:    ?Problems with blood clotting too easily:    ?    ?Skin    ?Rashes or ulcers:    ?    ?Constitutional    ?Fever or chills:    ? ? ?PHYSICAL EXAM:  ? ?Vitals:  ? 05/11/21 0821  ?BP: (!) 161/81  ?Pulse: 63  ?Resp: 20  ?Temp: (!) 97.3 ?F (36.3 ?C)  ?SpO2: 97%  ?Weight: 104 lb (47.2 kg)  ?Height: 5\' 2"  (1.575 m)   ? ? ?GENERAL: The patient is a well-nourished female, in no acute distress. The vital signs are documented above. ?CARDIAC: There is a regular rate and rhythm.  ?VASCULAR: Nonpalpable pedal pulses ?PULMONARY: Non-labored respirations ?ABDOMEN: Soft and non-tender with normal pitched bowel sounds.  ?MUSCULOSKELETAL: There are no major deformities or cyanosis. ?NEUROLOGIC: No focal weakness or paresthesias are detected. ?SKIN: There are no ulcers or rashes noted. ?PSYCHIATRIC: The patient has a normal affect. ? ?STUDIES:  ? ?I have reviewed the following CTA: ?Thoracic aortic aneurysm involving the proximal descending thoracic ?aorta demonstrating interval increase in size since prior ?examination now demonstrating maximal diameter of 4.7 x 4.2 cm ?(previously measuring 4.3 x 4.4 cm). Recommend semi-annual imaging ?followup by CTA or MRA and referral to cardiothoracic surgery if not ?already obtained. This recommendation follows 2010 ?ACCF/AHA/AATS/ACR/ASA/SCA/SCAI/SIR/STS/SVM Guidelines for the ?Diagnosis and Management of Patients With Thoracic Aortic Disease. ?Circulation. 2010; 1212011. Aortic aneurysm NOS (ICD10-I71.9) ?  ?Fusiform, moderately angulated juxtarenal abdominal aortic aneurysm ?measuring 4.1 x 4.3 cm in greatest dimension, enlarged since prior ?examination (previously measuring 3.9 x 3.9 cm). Note that the ?celiac axis and superior mesenteric artery arise from the left ?lateral margin of the aneurysm sac. ?  ?Infrarenal abdominal aortic aneurysm status post bifurcated ?endovascular stent graft repair. Interval increase in size of the ?aneurysm sac, now measuring 5.2 x 5.3 cm (previously measuring 4.9 x ?4.9 cm). Two foci of hyperattenuation within the aneurysm sac now ?noted which may represent type 2a and 2b endoleaks though this is ?not definitively characterized in absence of precontrast imaging. ?  ?Greater than 50% stenosis of the renal arteries bilaterally at their ?origins. ?  ?Moderate  multi-vessel coronary artery calcification. ?  ?Morphologic changes in keeping with pulmonary arterial hypertension. ?  ?Severe distal colonic diverticulosis without superimposed acute ?inflammatory change. ?MEDICAL ISSUES:  ? ?Abdominal aortic aneurysm: Maximum diameter is 5.3 cm.  There is a type II endoleak which explains the slight increase in size.  I will follow this up in 1 year with a repeat CT scan ? ?Thoracic aortic  aneurysm: This has increased in size with a maximum diameter of 4.7 cm, previously 4.4 cm.  This will be followed up in 1 year. ? ?PAD: The patient does have claudication symptoms but these are manageable.  She has a healed ulcer/eschar measuring about 1 mm on the outside of her right ankle.  She is getting continue to monitor this and contact me should she develop an open wound. ? ? ? ?Durene CalWells Lajoyce Tamura, IV, MD, FACS ?[facility] [place] ?Tel [phone] ?Pager [phone]  ?

## 2021-05-15 ENCOUNTER — Other Ambulatory Visit: Payer: Self-pay | Admitting: *Deleted

## 2021-05-15 DIAGNOSIS — I4891 Unspecified atrial fibrillation: Secondary | ICD-10-CM

## 2021-05-15 MED ORDER — APIXABAN 2.5 MG PO TABS: 2.5000 mg | ORAL_TABLET | Freq: Two times a day (BID) | ORAL | 1 refills | Status: DC

## 2021-05-15 NOTE — Telephone Encounter (Signed)
Eliquis 2.5mg  paper refill request received. Patient is 88 years old, weight-47.2kg, Crea-1.60 on 05/06/2021, Diagnosis-Afib, and last seen by Dr. Ladona Ridgel on 10/07/2020. Dose is appropriate based on dosing criteria. Will send in refill to requested pharmacy.   ?

## 2021-07-23 ENCOUNTER — Ambulatory Visit (INDEPENDENT_AMBULATORY_CARE_PROVIDER_SITE_OTHER): Payer: Medicare Other

## 2021-07-23 DIAGNOSIS — I442 Atrioventricular block, complete: Secondary | ICD-10-CM

## 2021-07-23 LAB — CUP PACEART REMOTE DEVICE CHECK
Battery Remaining Longevity: 37 mo
Battery Voltage: 2.94 V
Brady Statistic AP VP Percent: 10.66 %
Brady Statistic AP VS Percent: 0.18 %
Brady Statistic AS VP Percent: 88.62 %
Brady Statistic AS VS Percent: 0.54 %
Brady Statistic RA Percent Paced: 10.74 %
Brady Statistic RV Percent Paced: 99.28 %
Date Time Interrogation Session: 20230608031233
Implantable Lead Implant Date: 20190528
Implantable Lead Implant Date: 20190528
Implantable Lead Location: 753859
Implantable Lead Location: 753860
Implantable Lead Model: 3830
Implantable Lead Model: 5076
Implantable Pulse Generator Implant Date: 20190528
Lead Channel Impedance Value: 342 Ohm
Lead Channel Impedance Value: 361 Ohm
Lead Channel Impedance Value: 399 Ohm
Lead Channel Impedance Value: 475 Ohm
Lead Channel Pacing Threshold Amplitude: 1.125 V
Lead Channel Pacing Threshold Amplitude: 1.375 V
Lead Channel Pacing Threshold Pulse Width: 0.4 ms
Lead Channel Pacing Threshold Pulse Width: 0.4 ms
Lead Channel Sensing Intrinsic Amplitude: 1.625 mV
Lead Channel Sensing Intrinsic Amplitude: 1.625 mV
Lead Channel Sensing Intrinsic Amplitude: 18.25 mV
Lead Channel Sensing Intrinsic Amplitude: 18.25 mV
Lead Channel Setting Pacing Amplitude: 2.5 V
Lead Channel Setting Pacing Amplitude: 4 V
Lead Channel Setting Pacing Pulse Width: 0.6 ms
Lead Channel Setting Sensing Sensitivity: 1.2 mV

## 2021-07-27 DIAGNOSIS — I4891 Unspecified atrial fibrillation: Secondary | ICD-10-CM | POA: Diagnosis not present

## 2021-07-27 DIAGNOSIS — I700 Atherosclerosis of aorta: Secondary | ICD-10-CM | POA: Diagnosis not present

## 2021-07-27 DIAGNOSIS — E7849 Other hyperlipidemia: Secondary | ICD-10-CM | POA: Diagnosis not present

## 2021-07-27 DIAGNOSIS — I10 Essential (primary) hypertension: Secondary | ICD-10-CM | POA: Diagnosis not present

## 2021-07-31 NOTE — Progress Notes (Signed)
Remote pacemaker transmission.   

## 2021-08-26 ENCOUNTER — Ambulatory Visit (INDEPENDENT_AMBULATORY_CARE_PROVIDER_SITE_OTHER): Payer: Medicare Other | Admitting: Internal Medicine

## 2021-08-26 ENCOUNTER — Encounter: Payer: Self-pay | Admitting: Internal Medicine

## 2021-08-26 VITALS — BP 148/72 | HR 62 | Ht 62.0 in | Wt 104.4 lb

## 2021-08-26 DIAGNOSIS — Z950 Presence of cardiac pacemaker: Secondary | ICD-10-CM

## 2021-08-26 NOTE — Patient Instructions (Signed)
Medication Instructions:  Your physician recommends that you continue on your current medications as directed. Please refer to the Current Medication list given to you today.  *If you need a refill on your cardiac medications before your next appointment, please call your pharmacy*   Lab Work: NONE   If you have labs (blood work) drawn today and your tests are completely normal, you will receive your results only by: MyChart Message (if you have MyChart) OR A paper copy in the mail If you have any lab test that is abnormal or we need to change your treatment, we will call you to review the results.   Testing/Procedures: NONE    Follow-Up: At [facility], you and your health needs are our priority.  As part of our continuing mission to provide you with exceptional heart care, we have created designated Provider Care Teams.  These Care Teams include your primary Cardiologist (physician) and Advanced Practice Providers (APPs -  Physician Assistants and Nurse Practitioners) who all work together to provide you with the care you need, when you need it.  We recommend signing up for the patient portal called "MyChart".  Sign up information is provided on this After Visit Summary.  MyChart is used to connect with patients for Virtual Visits (Telemedicine).  Patients are able to view lab/test results, encounter notes, upcoming appointments, etc.  Non-urgent messages can be sent to your provider as well.   To learn more about what you can do with MyChart, go to [email or web address].    Your next appointment:   1 year(s)  The format for your next appointment:   In Person  Provider:   Gregg Taylor, MD    Other Instructions Thank you for choosing [facility]!    Important Information About Sugar       

## 2021-08-26 NOTE — Progress Notes (Signed)
HPI Deborah Moses returns today for followup. She is a pleasant 88 yo woman with CHB, s/p PPM insertion, peripheral vascular disease and AAA. She has had worsening in her AAA and underwent endovascular repair. She is limited by claudication. She denies chest pain or sob. No edema. She has some elevated pacing threshold in the atrium. She notes that her daughter has moved to [place].  No Known Allergies   Current Outpatient Medications  Medication Sig Dispense Refill   acetaminophen (TYLENOL) 500 MG tablet Take 500 mg by mouth every 6 (six) hours as needed for moderate pain.      alendronate (FOSAMAX) 70 MG tablet Take 70 mg by mouth once a week. Take with a full glass of water on an empty stomach.     amLODipine (NORVASC) 5 MG tablet Take 5 mg by mouth daily.     apixaban (ELIQUIS) 2.5 MG TABS tablet Take 1 tablet (2.5 mg total) by mouth 2 (two) times daily. 180 tablet 1   calcium-vitamin D (OSCAL WITH D) 500-200 MG-UNIT TABS tablet Take 1 tablet by mouth daily.      lisinopril (PRINIVIL,ZESTRIL) 20 MG tablet Take 40 mg by mouth daily.      lovastatin (MEVACOR) 20 MG tablet Take 20 mg by mouth at bedtime.     metoprolol succinate (TOPROL-XL) 50 MG 24 hr tablet Take 1 tablet (50 mg total) by mouth daily. 90 tablet 0   Omega-3 Fatty Acids (FISH OIL) 1000 MG CPDR Take 1,000 mg by mouth in the morning, at noon, and at bedtime.      No current facility-administered medications for this visit.     Past Medical History:  Diagnosis Date   AAA (abdominal aortic aneurysm) (HCC)    Heart block AV third degree (HCC)    HTN (hypertension)    Hyperlipidemia    Vertigo     ROS:   All systems reviewed and negative except as noted in the HPI.   Past Surgical History:  Procedure Laterality Date   ABDOMINAL AORTIC ENDOVASCULAR STENT GRAFT N/A 08/01/2019   Procedure: ABDOMINAL AORTIC ENDOVASCULAR STENT GRAFT;  Surgeon: Nada Libman, MD;  Location: [facility] OR;  Service: Vascular;  Laterality:  N/A;   APPENDECTOMY     per pt she might've gotten her appendix and gallbladder out together because that was "standard procedure back then"   CHOLECYSTECTOMY     PACEMAKER IMPLANT N/A 07/12/2017   Procedure: PACEMAKER IMPLANT;  Surgeon: Marinus Maw, MD;  [facility] INVASIVE CV LAB;  Service: Cardiovascular;  Laterality: N/A;   TONSILLECTOMY     ULTRASOUND GUIDANCE FOR VASCULAR ACCESS N/A 08/01/2019   Procedure: ULTRASOUND GUIDANCE FOR VASCULAR ACCESS;  Surgeon: Nada Libman, MD;  Location: [facility] OR;  Service: Vascular;  Laterality: N/A;     Family History  Problem Relation Age of Onset   Heart failure Father      Social History   Socioeconomic History   Marital status: Widowed    Spouse name: Not on file   Number of children: 4   Years of education: Not on file   Highest education level: Not on file  Occupational History   Not on file  Tobacco Use   Smoking status: Never   Smokeless tobacco: Never  Vaping Use   Vaping Use: Never used  Substance and Sexual Activity   Alcohol use: Never   Drug use: Never   Sexual activity: Not on file  Other Topics Concern  Not on file  Social History Narrative   Not on file   Social Determinants of Health   Financial Resource Strain: Not on file  Food Insecurity: Not on file  Transportation Needs: Not on file  Physical Activity: Not on file  Stress: Not on file  Social Connections: Not on file  Intimate Partner Violence: Not on file     BP (!) 148/72   Pulse 62   Ht 5\' 2"  (1.575 m)   Wt 104 lb 6.4 oz (47.4 kg)   SpO2 98%   BMI 19.10 kg/m   Physical Exam:  Well appearing NAD HEENT: Unremarkable Neck:  No JVD, no thyromegally Lymphatics:  No adenopathy Back:  No CVA tenderness Lungs:  Clear with no wheezes HEART:  Regular rate rhythm, no murmurs, no rubs, no clicks Abd:  soft, positive bowel sounds, no organomegally, no rebound, no guarding Ext:  2 plus pulses, no edema, no cyanosis, no clubbing Skin:  No  rashes no nodules Neuro:  CN II through XII intact, motor grossly intact  EKG - nsr with ventricular pacing  DEVICE  Normal device function.  See PaceArt for details.   Assess/Plan:  1. CHB - she is asymptomatic, s/p PPM insertion. 2. PPM - her atrial threshold is elevated but she is not using her atrial lead much. She will undergo watchful waiting. We adjusted her output today to maximize longevity 3. HTN - her bp is well controlled. 4. PAF - she is in NSR 99.1%. She will continue low dose eliquis.    .D.

## 2021-09-22 DIAGNOSIS — N390 Urinary tract infection, site not specified: Secondary | ICD-10-CM | POA: Diagnosis not present

## 2021-09-22 DIAGNOSIS — B952 Enterococcus as the cause of diseases classified elsewhere: Secondary | ICD-10-CM | POA: Diagnosis not present

## 2021-10-12 DIAGNOSIS — H40013 Open angle with borderline findings, low risk, bilateral: Secondary | ICD-10-CM | POA: Diagnosis not present

## 2021-10-22 ENCOUNTER — Ambulatory Visit (INDEPENDENT_AMBULATORY_CARE_PROVIDER_SITE_OTHER): Payer: Medicare Other

## 2021-10-22 DIAGNOSIS — I442 Atrioventricular block, complete: Secondary | ICD-10-CM | POA: Diagnosis not present

## 2021-10-22 LAB — CUP PACEART REMOTE DEVICE CHECK
Battery Remaining Longevity: 38 mo
Battery Voltage: 2.94 V
Brady Statistic AP VP Percent: 15.02 %
Brady Statistic AP VS Percent: 0.41 %
Brady Statistic AS VP Percent: 76.94 %
Brady Statistic AS VS Percent: 7.63 %
Brady Statistic RA Percent Paced: 15.36 %
Brady Statistic RV Percent Paced: 91.95 %
Date Time Interrogation Session: 20230907031535
Implantable Lead Implant Date: 20190528
Implantable Lead Implant Date: 20190528
Implantable Lead Location: 753859
Implantable Lead Location: 753860
Implantable Lead Model: 3830
Implantable Lead Model: 5076
Implantable Pulse Generator Implant Date: 20190528
Lead Channel Impedance Value: 342 Ohm
Lead Channel Impedance Value: 418 Ohm
Lead Channel Impedance Value: 456 Ohm
Lead Channel Impedance Value: 494 Ohm
Lead Channel Pacing Threshold Amplitude: 1.25 V
Lead Channel Pacing Threshold Amplitude: 1.375 V
Lead Channel Pacing Threshold Pulse Width: 0.4 ms
Lead Channel Pacing Threshold Pulse Width: 0.4 ms
Lead Channel Sensing Intrinsic Amplitude: 16.125 mV
Lead Channel Sensing Intrinsic Amplitude: 16.125 mV
Lead Channel Sensing Intrinsic Amplitude: 2.5 mV
Lead Channel Sensing Intrinsic Amplitude: 2.5 mV
Lead Channel Setting Pacing Amplitude: 2.5 V
Lead Channel Setting Pacing Amplitude: 4 V
Lead Channel Setting Pacing Pulse Width: 0.4 ms
Lead Channel Setting Sensing Sensitivity: 1.2 mV

## 2021-10-26 ENCOUNTER — Other Ambulatory Visit: Payer: Self-pay

## 2021-10-26 DIAGNOSIS — I4891 Unspecified atrial fibrillation: Secondary | ICD-10-CM

## 2021-10-26 MED ORDER — APIXABAN 2.5 MG PO TABS: 2.5000 mg | ORAL_TABLET | Freq: Two times a day (BID) | ORAL | 1 refills | Status: DC

## 2021-10-26 NOTE — Telephone Encounter (Signed)
Prescription refill request for Eliquis received. Indication:Afib Last office visit:7/23 Scr:1.6 Age: 88 Weight:47.4 kg  Prescription refilled

## 2021-11-07 NOTE — Progress Notes (Signed)
Remote pacemaker transmission.   

## 2021-11-09 DIAGNOSIS — I10 Essential (primary) hypertension: Secondary | ICD-10-CM | POA: Diagnosis not present

## 2021-11-09 DIAGNOSIS — N3000 Acute cystitis without hematuria: Secondary | ICD-10-CM | POA: Diagnosis not present

## 2021-11-09 DIAGNOSIS — Z0000 Encounter for general adult medical examination without abnormal findings: Secondary | ICD-10-CM | POA: Diagnosis not present

## 2021-11-09 DIAGNOSIS — I4891 Unspecified atrial fibrillation: Secondary | ICD-10-CM | POA: Diagnosis not present

## 2021-11-09 DIAGNOSIS — I700 Atherosclerosis of aorta: Secondary | ICD-10-CM | POA: Diagnosis not present

## 2021-11-09 DIAGNOSIS — E7849 Other hyperlipidemia: Secondary | ICD-10-CM | POA: Diagnosis not present

## 2021-11-23 DIAGNOSIS — Z23 Encounter for immunization: Secondary | ICD-10-CM | POA: Diagnosis not present

## 2021-12-09 DIAGNOSIS — H21251 Iridoschisis, right eye: Secondary | ICD-10-CM | POA: Diagnosis not present

## 2021-12-09 DIAGNOSIS — H40033 Anatomical narrow angle, bilateral: Secondary | ICD-10-CM | POA: Diagnosis not present

## 2021-12-09 DIAGNOSIS — H2513 Age-related nuclear cataract, bilateral: Secondary | ICD-10-CM | POA: Diagnosis not present

## 2022-01-18 DIAGNOSIS — H40013 Open angle with borderline findings, low risk, bilateral: Secondary | ICD-10-CM | POA: Diagnosis not present

## 2022-01-18 DIAGNOSIS — H01001 Unspecified blepharitis right upper eyelid: Secondary | ICD-10-CM | POA: Diagnosis not present

## 2022-01-18 DIAGNOSIS — H2513 Age-related nuclear cataract, bilateral: Secondary | ICD-10-CM | POA: Diagnosis not present

## 2022-01-18 DIAGNOSIS — H01002 Unspecified blepharitis right lower eyelid: Secondary | ICD-10-CM | POA: Diagnosis not present

## 2022-01-21 ENCOUNTER — Ambulatory Visit (INDEPENDENT_AMBULATORY_CARE_PROVIDER_SITE_OTHER): Payer: Medicare Other

## 2022-01-21 DIAGNOSIS — I442 Atrioventricular block, complete: Secondary | ICD-10-CM

## 2022-01-21 LAB — CUP PACEART REMOTE DEVICE CHECK
Battery Remaining Longevity: 34 mo
Battery Voltage: 2.94 V
Brady Statistic AP VP Percent: 17.29 %
Brady Statistic AP VS Percent: 0.21 %
Brady Statistic AS VP Percent: 81.35 %
Brady Statistic AS VS Percent: 1.14 %
Brady Statistic RA Percent Paced: 17.44 %
Brady Statistic RV Percent Paced: 98.57 %
Date Time Interrogation Session: 20231207021539
Implantable Lead Connection Status: 753985
Implantable Lead Connection Status: 753985
Implantable Lead Implant Date: 20190528
Implantable Lead Implant Date: 20190528
Implantable Lead Location: 753859
Implantable Lead Location: 753860
Implantable Lead Model: 3830
Implantable Lead Model: 5076
Implantable Pulse Generator Implant Date: 20190528
Lead Channel Impedance Value: 342 Ohm
Lead Channel Impedance Value: 399 Ohm
Lead Channel Impedance Value: 437 Ohm
Lead Channel Impedance Value: 570 Ohm
Lead Channel Pacing Threshold Amplitude: 1.375 V
Lead Channel Pacing Threshold Amplitude: 1.375 V
Lead Channel Pacing Threshold Pulse Width: 0.4 ms
Lead Channel Pacing Threshold Pulse Width: 0.4 ms
Lead Channel Sensing Intrinsic Amplitude: 17.125 mV
Lead Channel Sensing Intrinsic Amplitude: 17.125 mV
Lead Channel Sensing Intrinsic Amplitude: 2.5 mV
Lead Channel Sensing Intrinsic Amplitude: 2.5 mV
Lead Channel Setting Pacing Amplitude: 2.5 V
Lead Channel Setting Pacing Amplitude: 4 V
Lead Channel Setting Pacing Pulse Width: 0.4 ms
Lead Channel Setting Sensing Sensitivity: 1.2 mV
Zone Setting Status: 755011
Zone Setting Status: 755011

## 2022-02-10 ENCOUNTER — Telehealth: Payer: Self-pay

## 2022-02-10 NOTE — Telephone Encounter (Signed)
Patient assistance for Eliquis faxed to [employer] patient assistance foundation. Fax confirmation received 01/29/22.

## 2022-02-11 DIAGNOSIS — H25812 Combined forms of age-related cataract, left eye: Secondary | ICD-10-CM | POA: Diagnosis not present

## 2022-02-12 NOTE — Progress Notes (Signed)
Remote pacemaker transmission.   

## 2022-02-16 NOTE — H&P (Signed)
Surgical History & Physical  Patient Name: Deborah Moses DOB: 10-09-1931  Surgery: Cataract extraction with intraocular lens implant phacoemulsification; Left Eye  Surgeon: Baruch Goldmann MD Surgery Date:  02-22-22 Pre-Op Date:  02-01-22  HPI: A 46 Yr. old female patient 1. The patient was referred from Dr. Rosana Hoes to [place] for a cataract evaluation of both eyes. The patient's vision is blurry. The complaint is associated with difficulty reading small print on medicine bottles/labels, difficulty reading traffic and/or street signs, and difficulty writing checks/filling out forms. This is negatively affecting the patient's quality of life and the patient is unable to function adequately in life with the current level of vision. HPI Completed by Dr. Baruch Goldmann  Medical History: Glaucoma Cataracts drusen OU High Blood Pressure  Review of Systems Negative Allergic/Immunologic Negative Cardiovascular Negative Constitutional Negative Ear, Nose, Mouth & Throat Negative Endocrine Negative Eyes Negative Gastrointestinal Negative Genitourinary Negative Hemotologic/Lymphatic Negative Integumentary Negative Musculoskeletal Negative Neurological Negative Psychiatry Negative Respiratory  Social   Never smoked   Medication  None  Sx/Procedures Pacemaker placement, Stent implant,   Drug Allergies   NKDA  History & Physical: Heent: cataract; left eye NECK: supple without bruits LUNGS: lungs clear to auscultation CV: regular rate and rhythm Abdomen: soft and non-tender Impression & Plan: Assessment: 1.  NUCLEAR SCLEROSIS AGE RELATED; Both Eyes (H25.13) 2.  OAG BORDERLINE FINDINGS LOW RISK; Both Eyes (H40.013) 3.  BLEPHARITIS; Right Upper Lid, Right Lower Lid, Left Upper Lid, Left Lower Lid (H01.001, H01.002,H01.004,H01.005) 4.  CONJUNCTIVOCHALASIS; Both Eyes (H11.823) 5.  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  Cataract accounts for the patient's  decreased vision. This visual impairment is not correctable with a tolerable change in glasses or contact lenses. Cataract surgery with an implantation of a new lens should significantly improve the visual and functional status of the patient. Discussed all risks, benefits, alternatives, and potential complications. Discussed the procedures and recovery. Patient desires to have surgery. A-scan ordered and performed today for intra-ocular lens calculations. The surgery will be performed in order to improve vision for driving, reading, and for eye examinations. Recommend phacoemulsification with intra-ocular lens. Recommend Dextenza for post-operative pain and inflammation. Left Eye worse - first. Dilates well - shugarcaine by protocol.  2.  Based on cup-to-disc ratio. Negative Family history. OCT rNFL shows: WNL OD, thinning OS. Detailed discussion about glaucoma today including importance of maintaining good follow up and following treatment plan, and the possibility of irreversible blindness as part of this disease process.  3.  Recommend regular lid cleaning.  4.  Artificial tears  5.  IOP is normal. Sectoral. Monitor.

## 2022-02-17 ENCOUNTER — Encounter (HOSPITAL_COMMUNITY): Payer: Self-pay

## 2022-02-17 ENCOUNTER — Encounter (HOSPITAL_COMMUNITY)
Admission: RE | Admit: 2022-02-17 | Discharge: 2022-02-17 | Disposition: A | Discharge status: Home / Self Care | Payer: BLUE CROSS/BLUE SHIELD | Source: Ambulatory Visit | Attending: Ophthalmology | Admitting: Ophthalmology

## 2022-02-22 ENCOUNTER — Encounter (HOSPITAL_COMMUNITY)
Admission: RE | Disposition: A | Discharge status: Home / Self Care | Payer: Self-pay | Source: Ambulatory Visit | Attending: Ophthalmology

## 2022-02-22 ENCOUNTER — Ambulatory Visit (HOSPITAL_BASED_OUTPATIENT_CLINIC_OR_DEPARTMENT_OTHER): Payer: Medicare Other | Admitting: Certified Registered Nurse Anesthetist

## 2022-02-22 ENCOUNTER — Ambulatory Visit (HOSPITAL_COMMUNITY): Payer: Medicare Other | Admitting: Certified Registered Nurse Anesthetist

## 2022-02-22 ENCOUNTER — Ambulatory Visit (HOSPITAL_COMMUNITY)
Admission: RE | Admit: 2022-02-22 | Discharge: 2022-02-22 | Disposition: A | Discharge status: Home / Self Care | Payer: Medicare Other | Source: Ambulatory Visit | Attending: Ophthalmology | Admitting: Ophthalmology

## 2022-02-22 DIAGNOSIS — H0100A Unspecified blepharitis right eye, upper and lower eyelids: Secondary | ICD-10-CM | POA: Diagnosis not present

## 2022-02-22 DIAGNOSIS — H0100B Unspecified blepharitis left eye, upper and lower eyelids: Secondary | ICD-10-CM | POA: Insufficient documentation

## 2022-02-22 DIAGNOSIS — H21261 Iris atrophy (essential) (progressive), right eye: Secondary | ICD-10-CM | POA: Diagnosis not present

## 2022-02-22 DIAGNOSIS — G709 Myoneural disorder, unspecified: Secondary | ICD-10-CM

## 2022-02-22 DIAGNOSIS — H25811 Combined forms of age-related cataract, right eye: Secondary | ICD-10-CM | POA: Diagnosis not present

## 2022-02-22 DIAGNOSIS — H40013 Open angle with borderline findings, low risk, bilateral: Secondary | ICD-10-CM | POA: Diagnosis not present

## 2022-02-22 DIAGNOSIS — H25812 Combined forms of age-related cataract, left eye: Secondary | ICD-10-CM | POA: Insufficient documentation

## 2022-02-22 DIAGNOSIS — I10 Essential (primary) hypertension: Secondary | ICD-10-CM | POA: Diagnosis not present

## 2022-02-22 DIAGNOSIS — H2512 Age-related nuclear cataract, left eye: Secondary | ICD-10-CM | POA: Diagnosis not present

## 2022-02-22 DIAGNOSIS — H11823 Conjunctivochalasis, bilateral: Secondary | ICD-10-CM | POA: Diagnosis not present

## 2022-02-22 HISTORY — PX: CATARACT EXTRACTION W/PHACO: SHX586

## 2022-02-22 SURGERY — PHACOEMULSIFICATION, CATARACT, WITH IOL INSERTION
Anesthesia: Monitor Anesthesia Care | Site: Eye | Laterality: Left

## 2022-02-22 MED ORDER — BSS IO SOLN
INTRAOCULAR | Status: DC | PRN
  Administered 2022-02-22: 15 mL via INTRAOCULAR

## 2022-02-22 MED ORDER — EPINEPHRINE PF 1 MG/ML IJ SOLN
INTRAOCULAR | Status: DC | PRN
  Administered 2022-02-22: 500 mL

## 2022-02-22 MED ORDER — TROPICAMIDE 1 % OP SOLN
1.0000 [drp] | OPHTHALMIC | Status: AC | PRN
  Administered 2022-02-22 (×3): 1 [drp] via OPHTHALMIC

## 2022-02-22 MED ORDER — LIDOCAINE HCL 3.5 % OP GEL
1.0000 | Freq: Once | OPHTHALMIC | Status: AC
  Administered 2022-02-22: 1 via OPHTHALMIC

## 2022-02-22 MED ORDER — TETRACAINE HCL 0.5 % OP SOLN
1.0000 [drp] | OPHTHALMIC | Status: AC | PRN
Start: 2022-02-22 — End: 2022-02-22
  Administered 2022-02-22 (×3): 1 [drp] via OPHTHALMIC

## 2022-02-22 MED ORDER — PHENYLEPHRINE HCL 2.5 % OP SOLN
1.0000 [drp] | OPHTHALMIC | Status: AC | PRN
  Administered 2022-02-22 (×3): 1 [drp] via OPHTHALMIC

## 2022-02-22 MED ORDER — SODIUM HYALURONATE 10 MG/ML IO SOLUTION
PREFILLED_SYRINGE | INTRAOCULAR | Status: DC | PRN
  Administered 2022-02-22: .85 mL via INTRAOCULAR

## 2022-02-22 MED ORDER — STERILE WATER FOR IRRIGATION IR SOLN
Status: DC | PRN
  Administered 2022-02-22: 250 mL

## 2022-02-22 MED ORDER — POVIDONE-IODINE 5 % OP SOLN
OPHTHALMIC | Status: DC | PRN
  Administered 2022-02-22: 1 via OPHTHALMIC

## 2022-02-22 MED ORDER — MOXIFLOXACIN HCL 5 MG/ML IO SOLN
INTRAOCULAR | Status: AC
  Filled 2022-02-22: qty 1

## 2022-02-22 MED ORDER — SODIUM HYALURONATE 23MG/ML IO SOSY
PREFILLED_SYRINGE | INTRAOCULAR | Status: DC | PRN
  Administered 2022-02-22: .6 mL via INTRAOCULAR

## 2022-02-22 MED ORDER — MOXIFLOXACIN HCL 0.5 % OP SOLN
OPHTHALMIC | Status: DC | PRN
  Administered 2022-02-22: .2 mL via OPHTHALMIC

## 2022-02-22 MED ORDER — LIDOCAINE HCL (PF) 1 % IJ SOLN
INTRAOCULAR | Status: DC | PRN
  Administered 2022-02-22: 1 mL via OPHTHALMIC

## 2022-02-22 SURGICAL SUPPLY — 15 items
CATARACT SUITE SIGHTPATH (MISCELLANEOUS) ×1 IMPLANT
CLOTH BEACON ORANGE TIMEOUT ST (SAFETY) ×1 IMPLANT
EYE SHIELD UNIVERSAL CLEAR (GAUZE/BANDAGES/DRESSINGS) IMPLANT
FEE CATARACT SUITE SIGHTPATH (MISCELLANEOUS) ×1 IMPLANT
GLOVE BIOGEL PI IND STRL 7.0 (GLOVE) ×2 IMPLANT
LENS IOL RAYNER 20.5 (Intraocular Lens) ×1 IMPLANT
LENS IOL RAYONE EMV 20.5 (Intraocular Lens) IMPLANT
NDL HYPO 18GX1.5 BLUNT FILL (NEEDLE) ×1 IMPLANT
NEEDLE HYPO 18GX1.5 BLUNT FILL (NEEDLE) ×1 IMPLANT
PAD ARMBOARD 7.5X6 YLW CONV (MISCELLANEOUS) ×1 IMPLANT
RING MALYGIN 7.0 (MISCELLANEOUS) IMPLANT
SYR TB 1ML LL NO SAFETY (SYRINGE) ×1 IMPLANT
TAPE SURG TRANSPORE 1 IN (GAUZE/BANDAGES/DRESSINGS) IMPLANT
TAPE SURGICAL TRANSPORE 1 IN (GAUZE/BANDAGES/DRESSINGS) ×1
WATER STERILE IRR 250ML POUR (IV SOLUTION) ×1 IMPLANT

## 2022-02-22 NOTE — Op Note (Signed)
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 Sajid Ruppert, MD, MA  Anesthesia: MAC, Topical Akten  Complications: None  Estimated Blood Loss: <469m (minimal)  Specimens: None  Implants: As above  Indications:  Visually significant age-related cataract, Left Eye  Procedure:  The patient was seen and identified in the pre-operative area. The operative eye was identified and dilated.  The operative eye was marked.  Topical anesthesia was administered to the operative eye.     The patient was then to the operative suite and placed in the supine position.  A timeout was performed confirming the patient, procedure to be performed, and all other relevant information.   The patient's face was prepped and draped in the usual fashion for intra-ocular surgery.  A lid speculum was placed into the operative eye and the surgical microscope moved into place and focused.  An inferotemporal paracentesis was created using a 20 gauge paracentesis blade.  Shugarcaine was injected into the anterior chamber.  Viscoelastic was injected into the anterior chamber.  A temporal clear-corneal main wound incision was created using a 2.420mmicrokeratome.  A continuous curvilinear capsulorrhexis was initiated using an irrigating cystitome and completed using capsulorrhexis forceps.  Hydrodissection and hydrodeliniation were performed.  Viscoelastic was injected into the anterior chamber.  A phacoemulsification handpiece and a chopper as a second instrument were used to remove the nucleus and epinucleus. The irrigation/aspiration handpiece was used to remove any remaining  cortical material.   The capsular bag was reinflated with viscoelastic, checked, and found to be intact.  The intraocular lens was inserted into the capsular bag.  The irrigation/aspiration handpiece was used to remove any remaining viscoelastic.  The clear corneal wound and paracentesis wounds were then hydrated and checked with Weck-Cels to be watertight. 0.69m56mf moxifloxacin was injected into the anterior chamber. The lid-speculum was removed.  The drape was removed.  The patient's face was cleaned with a wet and dry 4x4.    A clear shield was taped over the eye. The patient was taken to the post-operative care unit in good condition, having tolerated the procedure well.  Post-Op Instructions: [facility] and will receive all other orders and instructions.

## 2022-02-22 NOTE — Transfer of Care (Signed)
Immediate Anesthesia Transfer of Care Note  Patient: Deborah Moses  Procedure(s) Performed: CATARACT EXTRACTION PHACO AND INTRAOCULAR LENS PLACEMENT (IOC) (Left: Eye)  Patient Location: PACU  Anesthesia Type:MAC  Level of Consciousness: awake, alert , and oriented  Airway & Oxygen Therapy: Patient Spontanous Breathing  Post-op Assessment: Report given to RN and Post -op Vital signs reviewed and stable  Post vital signs: Reviewed and stable  Last Vitals:  Vitals Value Taken Time  BP    Temp    Pulse    Resp    SpO2      Last Pain:  Vitals:   02/22/22 1011  PainSc: 0-No pain         Complications: No notable events documented.

## 2022-02-22 NOTE — Anesthesia Preprocedure Evaluation (Signed)
Anesthesia Evaluation  Patient identified by MRN, date of birth, ID band Patient awake    Reviewed: Allergy & Precautions, H&P , NPO status , Patient's Chart, lab work & pertinent test results, reviewed documented beta blocker date and time   Airway Mallampati: II  TM Distance: >3 FB Neck ROM: full    Dental no notable dental hx.    Pulmonary neg pulmonary ROS   Pulmonary exam normal breath sounds clear to auscultation       Cardiovascular Exercise Tolerance: Good hypertension, negative cardio ROS  Rhythm:regular Rate:Normal     Neuro/Psych  Neuromuscular disease negative neurological ROS  negative psych ROS   GI/Hepatic negative GI ROS, Neg liver ROS,,,  Endo/Other  negative endocrine ROS    Renal/GU negative Renal ROS  negative genitourinary   Musculoskeletal negative musculoskeletal ROS (+)    Abdominal   Peds negative pediatric ROS (+)  Hematology negative hematology ROS (+)   Anesthesia Other Findings   Reproductive/Obstetrics negative OB ROS                             Anesthesia Physical Anesthesia Plan  ASA: 3  Anesthesia Plan: MAC   Post-op Pain Management:    Induction:   PONV Risk Score and Plan:   Airway Management Planned:   Additional Equipment:   Intra-op Plan:   Post-operative Plan:   Informed Consent: I have reviewed the patients History and Physical, chart, labs and discussed the procedure including the risks, benefits and alternatives for the proposed anesthesia with the patient or authorized representative who has indicated his/her understanding and acceptance.     Dental Advisory Given  Plan Discussed with: CRNA  Anesthesia Plan Comments:        Anesthesia Quick Evaluation

## 2022-02-22 NOTE — Discharge Instructions (Signed)
Please discharge patient when stable, will follow up today with Dr. Gyselle Matthew at the [facility] office immediately following discharge.  Leave shield in place until visit.  All paperwork with discharge instructions will be given at the office.  [facility] Address:  [address]  [place], [place] [address]  

## 2022-02-22 NOTE — Interval H&P Note (Signed)
History and Physical Interval Note:  02/22/2022 10:40 AM  Deborah Moses  has presented today for surgery, with the diagnosis of nuclear sclerosis age related cataract; left.  The various methods of treatment have been discussed with the patient and family. After consideration of risks, benefits and other options for treatment, the patient has consented to  Procedure(s) with comments: CATARACT EXTRACTION PHACO AND INTRAOCULAR LENS PLACEMENT (IOC) (Left) - pt knows to arrive at 9:00 as a surgical intervention.  The patient's history has been reviewed, patient examined, no change in status, stable for surgery.  I have reviewed the patient's chart and labs.  Questions were answered to the patient's satisfaction.     Baruch Goldmann

## 2022-02-23 NOTE — Anesthesia Postprocedure Evaluation (Signed)
Anesthesia Post Note  Patient: Deborah Moses  Procedure(s) Performed: CATARACT EXTRACTION PHACO AND INTRAOCULAR LENS PLACEMENT (IOC) (Left: Eye)  Patient location during evaluation: Phase II Anesthesia Type: MAC Level of consciousness: awake Pain management: pain level controlled Vital Signs Assessment: post-procedure vital signs reviewed and stable Respiratory status: spontaneous breathing and respiratory function stable Cardiovascular status: blood pressure returned to baseline and stable Postop Assessment: no headache and no apparent nausea or vomiting Anesthetic complications: no Comments: Late entry   No notable events documented.   Last Vitals:  Vitals:   02/22/22 1017 02/22/22 1112  BP:  (!) 187/75  Pulse: 64 66  Resp: 18 18  Temp:  36.7 C  SpO2: 97% 98%    Last Pain:  Vitals:   02/22/22 1112  TempSrc: Oral  PainSc: 0-No pain                 Louann Sjogren

## 2022-02-25 ENCOUNTER — Encounter (HOSPITAL_COMMUNITY): Payer: Self-pay | Admitting: Ophthalmology

## 2022-03-01 DIAGNOSIS — E7849 Other hyperlipidemia: Secondary | ICD-10-CM | POA: Diagnosis not present

## 2022-03-01 DIAGNOSIS — I700 Atherosclerosis of aorta: Secondary | ICD-10-CM | POA: Diagnosis not present

## 2022-03-01 DIAGNOSIS — I10 Essential (primary) hypertension: Secondary | ICD-10-CM | POA: Diagnosis not present

## 2022-03-01 DIAGNOSIS — N1831 Chronic kidney disease, stage 3a: Secondary | ICD-10-CM | POA: Diagnosis not present

## 2022-03-04 ENCOUNTER — Telehealth: Payer: Self-pay

## 2022-03-04 NOTE — Telephone Encounter (Signed)
**Note De-Identified Britanie Harshman Obfuscation** We received a letter from [facility] stating that they need the providers page of the pts application as the one that they received was missing information.  I have completed the providers page of a [employer] application and have e-mailed it to Dr Tanna Furry nurse so he can obtain his signature, date it, and to fax to [facility] at the fax number written on the cover letter included.

## 2022-03-04 NOTE — Telephone Encounter (Signed)
Per message received from Ms. Via LPN, I will have the [employer] signed / fax it to the number on the document.

## 2022-03-22 NOTE — Telephone Encounter (Signed)
[employer] APPROVED ELIQUIS FROM 03/14/22-02/15/23

## 2022-04-22 ENCOUNTER — Ambulatory Visit: Payer: Medicare Other

## 2022-04-22 DIAGNOSIS — I442 Atrioventricular block, complete: Secondary | ICD-10-CM | POA: Diagnosis not present

## 2022-04-22 LAB — CUP PACEART REMOTE DEVICE CHECK
Battery Remaining Longevity: 36 mo
Battery Voltage: 2.93 V
Brady Statistic AP VP Percent: 7.54 %
Brady Statistic AP VS Percent: 0.12 %
Brady Statistic AS VP Percent: 90.78 %
Brady Statistic AS VS Percent: 1.57 %
Brady Statistic RA Percent Paced: 7.5 %
Brady Statistic RV Percent Paced: 97.05 %
Date Time Interrogation Session: 20240307021721
Implantable Lead Connection Status: 753985
Implantable Lead Connection Status: 753985
Implantable Lead Implant Date: 20190528
Implantable Lead Implant Date: 20190528
Implantable Lead Location: 753859
Implantable Lead Location: 753860
Implantable Lead Model: 3830
Implantable Lead Model: 5076
Implantable Pulse Generator Implant Date: 20190528
Lead Channel Impedance Value: 323 Ohm
Lead Channel Impedance Value: 342 Ohm
Lead Channel Impedance Value: 361 Ohm
Lead Channel Impedance Value: 551 Ohm
Lead Channel Pacing Threshold Amplitude: 1.25 V
Lead Channel Pacing Threshold Amplitude: 1.375 V
Lead Channel Pacing Threshold Pulse Width: 0.4 ms
Lead Channel Pacing Threshold Pulse Width: 0.4 ms
Lead Channel Sensing Intrinsic Amplitude: 16.375 mV
Lead Channel Sensing Intrinsic Amplitude: 16.375 mV
Lead Channel Sensing Intrinsic Amplitude: 2.875 mV
Lead Channel Sensing Intrinsic Amplitude: 2.875 mV
Lead Channel Setting Pacing Amplitude: 2.5 V
Lead Channel Setting Pacing Amplitude: 4 V
Lead Channel Setting Pacing Pulse Width: 0.4 ms
Lead Channel Setting Sensing Sensitivity: 1.2 mV
Zone Setting Status: 755011
Zone Setting Status: 755011

## 2022-05-25 NOTE — Progress Notes (Signed)
Remote pacemaker transmission.   

## 2022-05-31 DIAGNOSIS — N1831 Chronic kidney disease, stage 3a: Secondary | ICD-10-CM | POA: Diagnosis not present

## 2022-05-31 DIAGNOSIS — I4819 Other persistent atrial fibrillation: Secondary | ICD-10-CM | POA: Diagnosis not present

## 2022-05-31 DIAGNOSIS — M1710 Unilateral primary osteoarthritis, unspecified knee: Secondary | ICD-10-CM | POA: Diagnosis not present

## 2022-05-31 DIAGNOSIS — I10 Essential (primary) hypertension: Secondary | ICD-10-CM | POA: Diagnosis not present

## 2022-05-31 DIAGNOSIS — Z0000 Encounter for general adult medical examination without abnormal findings: Secondary | ICD-10-CM | POA: Diagnosis not present

## 2022-05-31 DIAGNOSIS — E7849 Other hyperlipidemia: Secondary | ICD-10-CM | POA: Diagnosis not present

## 2022-05-31 DIAGNOSIS — I700 Atherosclerosis of aorta: Secondary | ICD-10-CM | POA: Diagnosis not present

## 2022-05-31 DIAGNOSIS — H25811 Combined forms of age-related cataract, right eye: Secondary | ICD-10-CM | POA: Diagnosis not present

## 2022-06-01 ENCOUNTER — Encounter (HOSPITAL_COMMUNITY): Payer: Self-pay

## 2022-06-01 ENCOUNTER — Other Ambulatory Visit: Payer: Self-pay

## 2022-06-01 ENCOUNTER — Encounter (HOSPITAL_COMMUNITY)
Admission: RE | Admit: 2022-06-01 | Discharge: 2022-06-01 | Disposition: A | Discharge status: Home / Self Care | Payer: Medicare Other | Source: Ambulatory Visit | Attending: Ophthalmology | Admitting: Ophthalmology

## 2022-06-02 ENCOUNTER — Other Ambulatory Visit: Payer: Self-pay

## 2022-06-02 DIAGNOSIS — I7123 Aneurysm of the descending thoracic aorta, without rupture: Secondary | ICD-10-CM

## 2022-06-02 DIAGNOSIS — I714 Abdominal aortic aneurysm, without rupture, unspecified: Secondary | ICD-10-CM

## 2022-06-03 NOTE — H&P (Signed)
Surgical History & Physical  Patient Name: Deborah Moses DOB: April 25, 1931  Surgery: Cataract extraction with intraocular lens implant phacoemulsification; Right Eye  Surgeon: Fabio Pierce MD Surgery Date:  06-07-22 Pre-Op Date:  05-31-22  HPI: A 56 Yr. old female patient 1. The patient is returning after cataract post-op. The left eye is affected. Status post cataract post-op,2 months ago: Since the last visit, the affected area is doing well. The patient's vision is stable. Patient finished postop medication instructions. The patient's vision in right eye blurry. The complaint is associated with difficulty reading small print on medicine bottles/labels, difficulty reading traffic and/or street signs, and difficulty writing checks/filling out forms. This is negatively affecting the patient's quality of life and the patient is unable to function adequately in life with the current level of vision. HPI was performed by Fabio Pierce .  Medical History: Glaucoma Cataracts drusen OU High Blood Pressure  Review of Systems Negative Allergic/Immunologic Negative Cardiovascular Negative Constitutional Negative Ear, Nose, Mouth & Throat Negative Endocrine Negative Eyes Negative Gastrointestinal Negative Genitourinary Negative Hemotologic/Lymphatic Negative Integumentary Negative Musculoskeletal Negative Neurological Negative Psychiatry Negative Respiratory  Social   Never smoked   Medication Prednisolone-Moxifloxacin-Bromfenac,   Sx/Procedures Phaco c IOL OS,  Pacemaker placement, Stent implant,   Drug Allergies   NKDA  History & Physical: Heent: cataract, right eye NECK: supple without bruits LUNGS: lungs clear to auscultation CV: regular rate and rhythm Abdomen: soft and non-tender Impression & Plan: Assessment: 1.  NUCLEAR SCLEROSIS AGE RELATED; Right Eye (H25.11) 2.  CATARACT EXTRACTION STATUS; Left Eye (Z98.42) 3.  INTRAOCULAR LENS IOL ; Left Eye  (Z96.1)  Plan: 1.  Cataract accounts for the patient's decreased vision. This visual impairment is not correctable with a tolerable change in glasses or contact lenses. Cataract surgery with an implantation of a new lens should significantly improve the visual and functional status of the patient. Discussed all risks, benefits, alternatives, and potential complications. Discussed the procedures and recovery. Patient desires to have surgery. A-scan ordered and performed today for intra-ocular lens calculations. The surgery will be performed in order to improve vision for driving, reading, and for eye examinations. Recommend phacoemulsification with intra-ocular lens. Recommend Dextenza for post-operative pain and inflammation. Right Eye. Surgery required to correct imbalance of vision. Dilates well - shugarcaine by protocol.  2.  2 months after Phaco PCIOL. Doing very well with improved vision and normal IOP.  3.  Stable. Doing well since surgery

## 2022-06-07 ENCOUNTER — Ambulatory Visit (HOSPITAL_COMMUNITY)
Admission: RE | Admit: 2022-06-07 | Discharge: 2022-06-07 | Disposition: A | Discharge status: Home / Self Care | Payer: Medicare Other | Attending: Ophthalmology | Admitting: Ophthalmology

## 2022-06-07 ENCOUNTER — Encounter (HOSPITAL_COMMUNITY): Payer: Self-pay | Admitting: Ophthalmology

## 2022-06-07 ENCOUNTER — Ambulatory Visit (HOSPITAL_COMMUNITY): Payer: Medicare Other | Admitting: Anesthesiology

## 2022-06-07 ENCOUNTER — Encounter (HOSPITAL_COMMUNITY)
Admission: RE | Disposition: A | Discharge status: Home / Self Care | Payer: Self-pay | Source: Home / Self Care | Attending: Ophthalmology

## 2022-06-07 ENCOUNTER — Ambulatory Visit (HOSPITAL_BASED_OUTPATIENT_CLINIC_OR_DEPARTMENT_OTHER): Payer: Medicare Other | Admitting: Anesthesiology

## 2022-06-07 DIAGNOSIS — H21231 Degeneration of iris (pigmentary), right eye: Secondary | ICD-10-CM | POA: Diagnosis not present

## 2022-06-07 DIAGNOSIS — I10 Essential (primary) hypertension: Secondary | ICD-10-CM | POA: Diagnosis not present

## 2022-06-07 DIAGNOSIS — I442 Atrioventricular block, complete: Secondary | ICD-10-CM | POA: Diagnosis not present

## 2022-06-07 DIAGNOSIS — Z961 Presence of intraocular lens: Secondary | ICD-10-CM | POA: Insufficient documentation

## 2022-06-07 DIAGNOSIS — H2511 Age-related nuclear cataract, right eye: Secondary | ICD-10-CM

## 2022-06-07 DIAGNOSIS — Z9842 Cataract extraction status, left eye: Secondary | ICD-10-CM | POA: Diagnosis not present

## 2022-06-07 DIAGNOSIS — G709 Myoneural disorder, unspecified: Secondary | ICD-10-CM

## 2022-06-07 DIAGNOSIS — H25811 Combined forms of age-related cataract, right eye: Secondary | ICD-10-CM | POA: Diagnosis not present

## 2022-06-07 DIAGNOSIS — Z950 Presence of cardiac pacemaker: Secondary | ICD-10-CM | POA: Diagnosis not present

## 2022-06-07 HISTORY — PX: CATARACT EXTRACTION W/PHACO: SHX586

## 2022-06-07 SURGERY — PHACOEMULSIFICATION, CATARACT, WITH IOL INSERTION
Anesthesia: Monitor Anesthesia Care | Site: Eye | Laterality: Right

## 2022-06-07 MED ORDER — TETRACAINE HCL 0.5 % OP SOLN
1.0000 [drp] | OPHTHALMIC | Status: AC | PRN
  Administered 2022-06-07 (×3): 1 [drp] via OPHTHALMIC

## 2022-06-07 MED ORDER — PHENYLEPHRINE HCL 2.5 % OP SOLN
1.0000 [drp] | OPHTHALMIC | Status: AC | PRN
  Administered 2022-06-07 (×3): 1 [drp] via OPHTHALMIC

## 2022-06-07 MED ORDER — LIDOCAINE HCL (PF) 1 % IJ SOLN
INTRAOCULAR | Status: DC | PRN
  Administered 2022-06-07: 1 mL via OPHTHALMIC

## 2022-06-07 MED ORDER — POVIDONE-IODINE 5 % OP SOLN
OPHTHALMIC | Status: DC | PRN
  Administered 2022-06-07: 1 via OPHTHALMIC

## 2022-06-07 MED ORDER — LIDOCAINE HCL 3.5 % OP GEL: 1.0000 | Freq: Once | OPHTHALMIC | Status: DC

## 2022-06-07 MED ORDER — SODIUM HYALURONATE 23MG/ML IO SOSY
PREFILLED_SYRINGE | INTRAOCULAR | Status: DC | PRN
  Administered 2022-06-07: .6 mL via INTRAOCULAR

## 2022-06-07 MED ORDER — EPINEPHRINE PF 1 MG/ML IJ SOLN
INTRAOCULAR | Status: DC | PRN
  Administered 2022-06-07: 500 mL

## 2022-06-07 MED ORDER — SODIUM HYALURONATE 10 MG/ML IO SOLUTION
PREFILLED_SYRINGE | INTRAOCULAR | Status: DC | PRN
  Administered 2022-06-07: .85 mL via INTRAOCULAR

## 2022-06-07 MED ORDER — BSS IO SOLN
INTRAOCULAR | Status: DC | PRN
  Administered 2022-06-07: 15 mL via INTRAOCULAR

## 2022-06-07 MED ORDER — STERILE WATER FOR IRRIGATION IR SOLN
Status: DC | PRN
  Administered 2022-06-07: 250 mL

## 2022-06-07 MED ORDER — TROPICAMIDE 1 % OP SOLN
1.0000 [drp] | OPHTHALMIC | Status: AC | PRN
  Administered 2022-06-07 (×3): 1 [drp] via OPHTHALMIC

## 2022-06-07 MED ORDER — NEOMYCIN-POLYMYXIN-DEXAMETH 3.5-10000-0.1 OP SUSP
OPHTHALMIC | Status: DC | PRN
  Administered 2022-06-07: 2 [drp] via OPHTHALMIC

## 2022-06-07 SURGICAL SUPPLY — 14 items
CATARACT SUITE SIGHTPATH (MISCELLANEOUS) ×1 IMPLANT
CLOTH BEACON ORANGE TIMEOUT ST (SAFETY) ×1 IMPLANT
EYE SHIELD UNIVERSAL CLEAR (GAUZE/BANDAGES/DRESSINGS) IMPLANT
FEE CATARACT SUITE SIGHTPATH (MISCELLANEOUS) ×1 IMPLANT
GLOVE BIOGEL PI IND STRL 7.0 (GLOVE) ×2 IMPLANT
LENS IOL RAYNER 20.5 (Intraocular Lens) ×1 IMPLANT
LENS IOL RAYONE EMV 20.5 (Intraocular Lens) IMPLANT
NDL HYPO 18GX1.5 BLUNT FILL (NEEDLE) ×1 IMPLANT
NEEDLE HYPO 18GX1.5 BLUNT FILL (NEEDLE) ×1 IMPLANT
PAD ARMBOARD 7.5X6 YLW CONV (MISCELLANEOUS) ×1 IMPLANT
RING MALYGIN 7.0 (MISCELLANEOUS) IMPLANT
SYR TB 1ML LL NO SAFETY (SYRINGE) ×1 IMPLANT
TAPE SURG TRANSPORE 1 IN (GAUZE/BANDAGES/DRESSINGS) IMPLANT
WATER STERILE IRR 250ML POUR (IV SOLUTION) ×1 IMPLANT

## 2022-06-07 NOTE — Anesthesia Postprocedure Evaluation (Signed)
Anesthesia Post Note  Patient: Keyana Stys  Procedure(s) Performed: CATARACT EXTRACTION PHACO AND INTRAOCULAR LENS PLACEMENT (IOC) (Right: Eye)  Patient location during evaluation: Phase II Anesthesia Type: MAC Level of consciousness: awake and alert and oriented Pain management: pain level controlled Vital Signs Assessment: post-procedure vital signs reviewed and stable Respiratory status: spontaneous breathing, nonlabored ventilation and respiratory function stable Cardiovascular status: stable and blood pressure returned to baseline Postop Assessment: no apparent nausea or vomiting Anesthetic complications: no  No notable events documented.   Last Vitals:  Vitals:   06/07/22 0806 06/07/22 0919  BP: (!) 175/84 (!) 184/91  Pulse:  69  Resp: 15 17  Temp: 36.4 C 36.6 C  SpO2: 100% 100%    Last Pain:  Vitals:   06/07/22 0919  TempSrc: Oral  PainSc: 0-No pain                 Yuri Flener C Kathleen Tamm

## 2022-06-07 NOTE — Op Note (Signed)
Date of procedure: 06/07/22  Pre-operative diagnosis:  Visually significant combined form age-related cataract, Right Eye (H25.811)  Post-operative diagnosis:  Visually significant combined form age-related cataract, Right Eye (H25.811); inferior degeneration of the iris, right eye  Procedure: Removal of cataract via phacoemulsification and insertion of intra-ocular lens Rayner [id number] +20.5D into the capsular bag of the Right Eye  Attending surgeon: Rudy Jew. Brinley Rosete, MD, MA  Anesthesia: MAC, Topical Akten  Complications: None  Estimated Blood Loss: <28mL (minimal)  Specimens: None  Implants: As above  Indications:  Visually significant age-related cataract, Right Eye  Procedure:  The patient was seen and identified in the pre-operative area. The operative eye was identified and dilated.  The operative eye was marked.  Topical anesthesia was administered to the operative eye.     The patient was then to the operative suite and placed in the supine position.  A timeout was performed confirming the patient, procedure to be performed, and all other relevant information.   The patient's face was prepped and draped in the usual fashion for intra-ocular surgery.  A lid speculum was placed into the operative eye and the surgical microscope moved into place and focused. At this time it was noted that the inferior iris did not dilated well. A superotemporal paracentesis was created using a 20 gauge paracentesis blade.  Shugarcaine was injected into the anterior chamber.  Viscoelastic was injected into the anterior chamber giving good viscodilation.  A temporal clear-corneal main wound incision was created using a 2.82mm microkeratome.  A continuous curvilinear capsulorrhexis was initiated using an irrigating cystitome and completed using capsulorrhexis forceps.  Hydrodissection and hydrodeliniation were performed.  Viscoelastic was injected into the anterior chamber.  A phacoemulsification handpiece  and a chopper as a second instrument were used to remove the nucleus and epinucleus. The irrigation/aspiration handpiece was used to remove any remaining cortical material.   The capsular bag was reinflated with viscoelastic, checked, and found to be intact.  The intraocular lens was inserted into the capsular bag.  The irrigation/aspiration handpiece was used to remove any remaining viscoelastic.  The clear corneal wound and paracentesis wounds were then hydrated and checked with Weck-Cels to be watertight. A temporal Descemet's detachment was noted at this time. An air bubble was placed to re-attach Descemet's and was left in the [facility].  Maxitrol drops were instilled into the operative eye.  The lid-speculum was removed.  The drape was removed.  The patient's face was cleaned with a wet and dry 4x4. A clear shield was taped over the eye. The patient was taken to the post-operative care unit in good condition, having tolerated the procedure well.  Post-Op Instructions: The patient will follow up at [facility] for a same day post-operative evaluation and will receive all other orders and instructions.

## 2022-06-07 NOTE — Anesthesia Preprocedure Evaluation (Signed)
Anesthesia Evaluation  Patient identified by MRN, date of birth, ID band Patient awake    Reviewed: Allergy & Precautions, H&P , NPO status , Patient's Chart, lab work & pertinent test results, reviewed documented beta blocker date and time   Airway Mallampati: II  TM Distance: >3 FB Neck ROM: Full    Dental  (+) Missing, Dental Advisory Given   Pulmonary neg pulmonary ROS   Pulmonary exam normal breath sounds clear to auscultation       Cardiovascular Exercise Tolerance: Good hypertension, Pt. on medications and Pt. on home beta blockers + Peripheral Vascular Disease (AAA repair with endograft- 2021)  Normal cardiovascular exam+ dysrhythmias (complete heart block) + pacemaker + Valvular Problems/Murmurs  Rhythm:Regular Rate:Normal     Neuro/Psych  Neuromuscular disease  negative psych ROS   GI/Hepatic negative GI ROS, Neg liver ROS,,,  Endo/Other  negative endocrine ROS    Renal/GU negative Renal ROS  negative genitourinary   Musculoskeletal negative musculoskeletal ROS (+)    Abdominal   Peds negative pediatric ROS (+)  Hematology negative hematology ROS (+)   Anesthesia Other Findings   Reproductive/Obstetrics negative OB ROS                              Anesthesia Physical Anesthesia Plan  ASA: 3  Anesthesia Plan: MAC   Post-op Pain Management: Minimal or no pain anticipated   Induction: Intravenous  PONV Risk Score and Plan: Treatment may vary due to age or medical condition  Airway Management Planned: Nasal Cannula and Natural Airway  Additional Equipment:   Intra-op Plan:   Post-operative Plan:   Informed Consent: I have reviewed the patients History and Physical, chart, labs and discussed the procedure including the risks, benefits and alternatives for the proposed anesthesia with the patient or authorized representative who has indicated his/her understanding and  acceptance.     Dental advisory given  Plan Discussed with: CRNA and Surgeon  Anesthesia Plan Comments:          Anesthesia Quick Evaluation

## 2022-06-07 NOTE — Interval H&P Note (Signed)
History and Physical Interval Note:  06/07/2022 8:47 AM  Deborah Moses  has presented today for surgery, with the diagnosis of nuclear sclerosis age related cataract; right.  The various methods of treatment have been discussed with the patient and family. After consideration of risks, benefits and other options for treatment, the patient has consented to  Procedure(s): CATARACT EXTRACTION PHACO AND INTRAOCULAR LENS PLACEMENT (IOC) (Right) as a surgical intervention.  The patient's history has been reviewed, patient examined, no change in status, stable for surgery.  I have reviewed the patient's chart and labs.  Questions were answered to the patient's satisfaction.     Fabio Pierce

## 2022-06-07 NOTE — Transfer of Care (Signed)
Immediate Anesthesia Transfer of Care Note  Patient: Deborah Moses  Procedure(s) Performed: CATARACT EXTRACTION PHACO AND INTRAOCULAR LENS PLACEMENT (IOC) (Right: Eye)  Patient Location: PACU  Anesthesia Type:MAC  Level of Consciousness: awake, alert , oriented, and patient cooperative  Airway & Oxygen Therapy: Patient Spontanous Breathing  Post-op Assessment: Report given to RN, Post -op Vital signs reviewed and stable, and Patient moving all extremities X 4  Post vital signs: Reviewed and stable  Last Vitals:  Vitals Value Taken Time  BP 184/91 06/07/22 0919  Temp 36.6 C 06/07/22 0919  Pulse 69 06/07/22 0919  Resp 17 06/07/22 0919  SpO2 100 % 06/07/22 0919    Last Pain:  Vitals:   06/07/22 0919  TempSrc: Oral  PainSc: 0-No pain      Patients Stated Pain Goal: 9 (06/07/22 0806)  Complications: No notable events documented.

## 2022-06-07 NOTE — Discharge Instructions (Addendum)
Please discharge patient when stable, will follow up today with Dr. Wrzosek at the [facility] [place] office immediately following discharge.  Leave shield in place until visit.  All paperwork with discharge instructions will be given at the office.  [facility] Address:  [address]  [place], [place] [address]  

## 2022-06-09 ENCOUNTER — Encounter (HOSPITAL_COMMUNITY): Payer: Self-pay | Admitting: Ophthalmology

## 2022-06-13 ENCOUNTER — Encounter (HOSPITAL_BASED_OUTPATIENT_CLINIC_OR_DEPARTMENT_OTHER): Payer: Self-pay

## 2022-06-13 ENCOUNTER — Ambulatory Visit (HOSPITAL_BASED_OUTPATIENT_CLINIC_OR_DEPARTMENT_OTHER)
Admission: RE | Admit: 2022-06-13 | Discharge: 2022-06-13 | Disposition: A | Discharge status: Home / Self Care | Payer: Medicare Other | Source: Ambulatory Visit | Attending: Surgery | Admitting: Surgery

## 2022-06-13 DIAGNOSIS — I7123 Aneurysm of the descending thoracic aorta, without rupture: Secondary | ICD-10-CM | POA: Diagnosis not present

## 2022-06-13 DIAGNOSIS — I714 Abdominal aortic aneurysm, without rupture, unspecified: Secondary | ICD-10-CM

## 2022-06-13 LAB — POCT I-STAT CREATININE: Creatinine, Ser: 1.7 mg/dL — ABNORMAL HIGH (ref 0.44–1.00)

## 2022-06-14 ENCOUNTER — Ambulatory Visit: Payer: Medicare Other | Admitting: Surgery

## 2022-06-14 ENCOUNTER — Encounter: Payer: Self-pay | Admitting: Surgery

## 2022-06-14 VITALS — BP 169/94 | HR 65 | Temp 98.3°F | Resp 20 | Ht 62.0 in | Wt 104.0 lb

## 2022-06-14 DIAGNOSIS — I7123 Aneurysm of the descending thoracic aorta, without rupture: Secondary | ICD-10-CM

## 2022-06-14 DIAGNOSIS — I7143 Infrarenal abdominal aortic aneurysm, without rupture: Secondary | ICD-10-CM

## 2022-06-14 DIAGNOSIS — I70213 Atherosclerosis of native arteries of extremities with intermittent claudication, bilateral legs: Secondary | ICD-10-CM | POA: Diagnosis not present

## 2022-06-14 NOTE — Progress Notes (Signed)
[facility]  Patient name: Deborah Moses MRN: [id number] DOB: [birth year] Sex: female   REASON FOR VISIT:    Follow up  HISOTRY OF PRESENT ILLNESS:   Deborah Moses is a 89 y.o. female who is status post endovascular repair of an abdominal aortic aneurysm with maximum diameter of 5.2 cm on 08/01/2019.  Her postoperative course was uncomplicated.  She also has a known thoracic aneurysm.  CT scan 1 year ago shows maximum aortic diameter of 5.3 cm with a type II endoleak.  Maximum thoracic aortic diameter was 4.7 cm.  She was sent for a CT scan but her creatinine was elevated and so it was not obtained.  She denies any chest pain or back pain.    She has diminished ABI's in the 0.5 range.  She does have claudication symptoms when she walks outside however mostly she is inside and does not have calf cramping.    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  She is a non-smoker.  She is medically managed for hypertension.  She takes a statin for hypercholesterolemia.  She is on Eliquis for AFIB    PAST MEDICAL HISTORY:   Past Medical History:  Diagnosis Date   AAA (abdominal aortic aneurysm) (HCC)    Heart block AV third degree (HCC)    HTN (hypertension)    Hyperlipidemia    Vertigo      FAMILY HISTORY:   Family History  Problem Relation Age of Onset   Heart failure Father     SOCIAL HISTORY:   Social History   Tobacco Use   Smoking status: Never   Smokeless tobacco: Never  Substance Use Topics   Alcohol use: Never     ALLERGIES:   No Known Allergies   CURRENT MEDICATIONS:   Current Outpatient Medications  Medication Sig Dispense Refill   acetaminophen (TYLENOL) 500 MG tablet Take 500 mg by mouth every 6 (six) hours as needed for moderate pain.      alendronate (FOSAMAX) 70 MG tablet Take 70 mg by mouth once a week. Take with a full glass of water on an  empty stomach.     amLODipine (NORVASC) 5 MG tablet Take 5 mg by mouth daily.     apixaban (ELIQUIS) 2.5 MG TABS tablet Take 1 tablet (2.5 mg total) by mouth 2 (two) times daily. 180 tablet 1   calcium-vitamin D (OSCAL WITH D) 500-200 MG-UNIT TABS tablet Take 1 tablet by mouth daily.      lisinopril (PRINIVIL,ZESTRIL) 20 MG tablet Take 40 mg by mouth daily.      lovastatin (MEVACOR) 20 MG tablet Take 20 mg by mouth at bedtime.     metoprolol succinate (TOPROL-XL) 50 MG 24 hr tablet Take 1 tablet (50 mg total) by mouth daily. 90 tablet 0   Omega-3 Fatty Acids (FISH OIL) 1000 MG CPDR Take 1,000 mg by mouth in the morning, at noon, and at bedtime.      No current facility-administered medications for this visit.    REVIEW OF SYSTEMS:   [X]  denotes positive finding, [ ]  denotes negative finding Cardiac  Comments:  Chest pain or chest pressure:    Shortness of breath upon exertion:    Short of breath when lying flat:    Irregular heart rhythm:        Vascular    Pain in calf, thigh, or hip brought on by ambulation: x   Pain in feet at  night that wakes you up from your sleep:     Blood clot in your veins:    Leg swelling:         Pulmonary    Oxygen at home:    Productive cough:     Wheezing:         Neurologic    Sudden weakness in arms or legs:     Sudden numbness in arms or legs:     Sudden onset of difficulty speaking or slurred speech:    Temporary loss of vision in one eye:     Problems with dizziness:         Gastrointestinal    Blood in stool:     Vomited blood:         Genitourinary    Burning when urinating:     Blood in urine:        Psychiatric    Major depression:         Hematologic    Bleeding problems:    Problems with blood clotting too easily:        Skin    Rashes or ulcers:        Constitutional    Fever or chills:      PHYSICAL EXAM:   Vitals:   06/14/22 0904  BP: (!) 169/94  Pulse: 65  Resp: 20  Temp: 98.3 F (36.8 C)  SpO2: 93%   Weight: 104 lb (47.2 kg)  Height: 5\' 2"  (1.575 m)    GENERAL: The patient is a well-nourished female, in no acute distress. The vital signs are documented above. CARDIAC: There is a regular rate and rhythm.  VASCULAR: Nonpalpable pedal pulses.  No carotid bruits PULMONARY: Non-labored respirations ABDOMEN: Soft and non-tender  MUSCULOSKELETAL: There are no major deformities or cyanosis. NEUROLOGIC: No focal weakness or paresthesias are detected. SKIN: There are no ulcers or rashes noted. PSYCHIATRIC: The patient has a normal affect.  STUDIES:   CT scan was canceled because of a creatinine of 1.7   MEDICAL ISSUES:   AAA: Maximum diameter 1 year ago was 5.3 cm.  She does have a type II endoleak.  This was not imaged today because of her creatinine.  I will get a ultrasound when she returns in 3 months  TAAA: Again this was not imaged.  1 year ago it was 4.7 cm.  Because of her renal issues, I will get a noncontrasted CT scan with fine cuts in 3 months  Medication: Her symptoms remain stable.  She likely also has superimposed arthritis.  As long as she is without nonhealing wounds, I would not recommend revascularization    Charlena Cross, MD, FACS [facility] [phone] Pager [phone]

## 2022-06-21 ENCOUNTER — Other Ambulatory Visit: Payer: Self-pay

## 2022-06-21 DIAGNOSIS — I7143 Infrarenal abdominal aortic aneurysm, without rupture: Secondary | ICD-10-CM

## 2022-07-22 ENCOUNTER — Ambulatory Visit (INDEPENDENT_AMBULATORY_CARE_PROVIDER_SITE_OTHER): Payer: Medicare Other

## 2022-07-22 DIAGNOSIS — I442 Atrioventricular block, complete: Secondary | ICD-10-CM

## 2022-07-22 LAB — CUP PACEART REMOTE DEVICE CHECK
Battery Remaining Longevity: 34 mo
Battery Voltage: 2.93 V
Brady Statistic AP VP Percent: 14.24 %
Brady Statistic AP VS Percent: 0.01 %
Brady Statistic AS VP Percent: 85.05 %
Brady Statistic AS VS Percent: 0.69 %
Brady Statistic RA Percent Paced: 14.14 %
Brady Statistic RV Percent Paced: 99.29 %
Date Time Interrogation Session: 20240606031728
Implantable Lead Connection Status: 753985
Implantable Lead Connection Status: 753985
Implantable Lead Implant Date: 20190528
Implantable Lead Implant Date: 20190528
Implantable Lead Location: 753859
Implantable Lead Location: 753860
Implantable Lead Model: 3830
Implantable Lead Model: 5076
Implantable Pulse Generator Implant Date: 20190528
Lead Channel Impedance Value: 323 Ohm
Lead Channel Impedance Value: 361 Ohm
Lead Channel Impedance Value: 361 Ohm
Lead Channel Impedance Value: 532 Ohm
Lead Channel Pacing Threshold Amplitude: 1.125 V
Lead Channel Pacing Threshold Amplitude: 1.375 V
Lead Channel Pacing Threshold Pulse Width: 0.4 ms
Lead Channel Pacing Threshold Pulse Width: 0.4 ms
Lead Channel Sensing Intrinsic Amplitude: 18.125 mV
Lead Channel Sensing Intrinsic Amplitude: 18.125 mV
Lead Channel Sensing Intrinsic Amplitude: 2.625 mV
Lead Channel Sensing Intrinsic Amplitude: 2.625 mV
Lead Channel Setting Pacing Amplitude: 2.5 V
Lead Channel Setting Pacing Amplitude: 4 V
Lead Channel Setting Pacing Pulse Width: 0.4 ms
Lead Channel Setting Sensing Sensitivity: 1.2 mV
Zone Setting Status: 755011
Zone Setting Status: 755011

## 2022-07-27 DIAGNOSIS — M1990 Unspecified osteoarthritis, unspecified site: Secondary | ICD-10-CM | POA: Diagnosis not present

## 2022-07-27 DIAGNOSIS — D6869 Other thrombophilia: Secondary | ICD-10-CM | POA: Diagnosis not present

## 2022-08-10 IMAGING — CT CT ANGIO CHEST
2 of 6 series · 14 of 46 positions shown · non-contrast
Comparison: 07/03/2019, 01/16/2018

CLINICAL DATA: Thoracoabdominal aortic aneurysm. Follow-up
examination.

EXAM:
CT ANGIOGRAPHY CHEST, ABDOMEN AND PELVIS
TECHNIQUE: Non-contrast CT of the chest was initially obtained.

[Series 4: axial arterial · axial · arterial · 0.95mm/px · z∈[-459,+36]mm · 11 of 189 slices shown]
[im 12/189  lung]
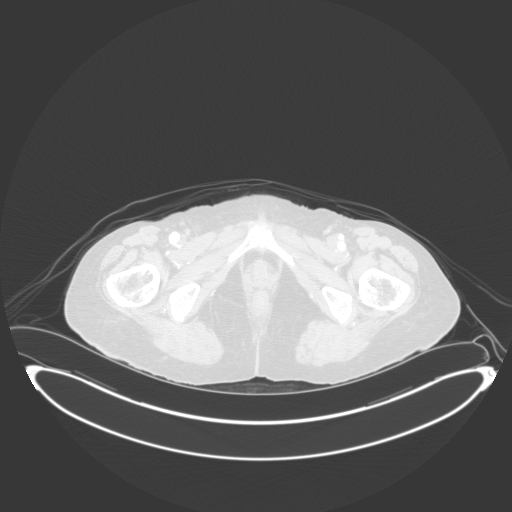
[im 36/189  soft-tissue]
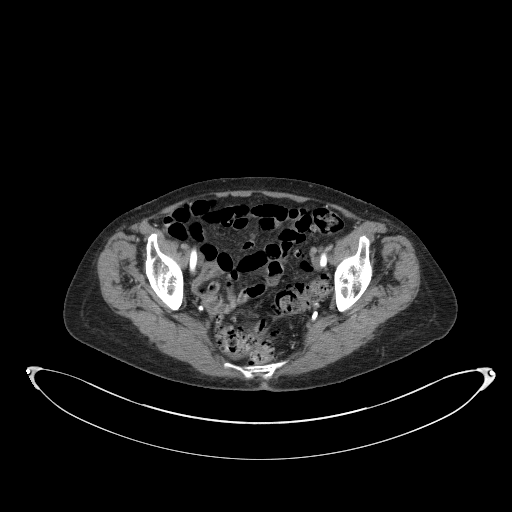
[im 48/189  lung]
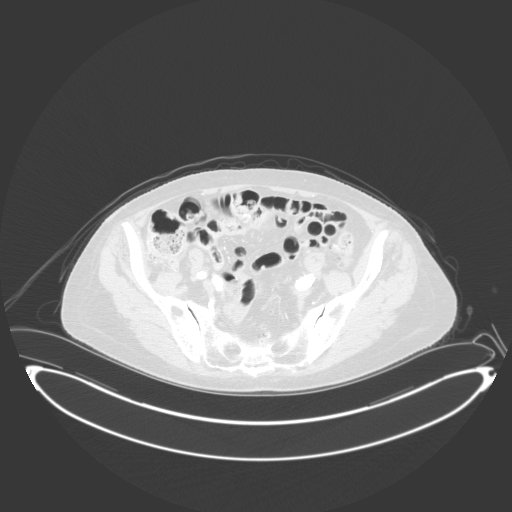
[im 59/189  soft-tissue]
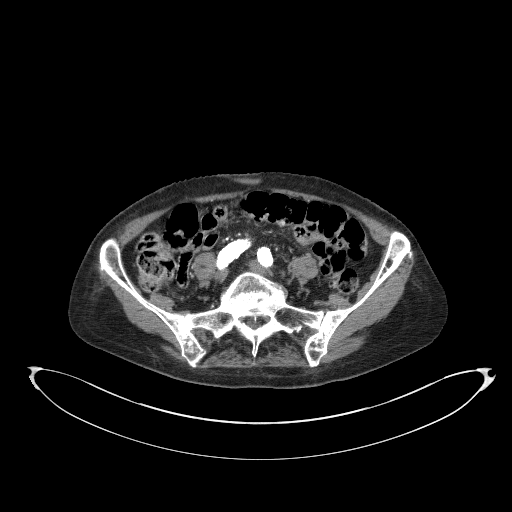
[im 83/189  lung]
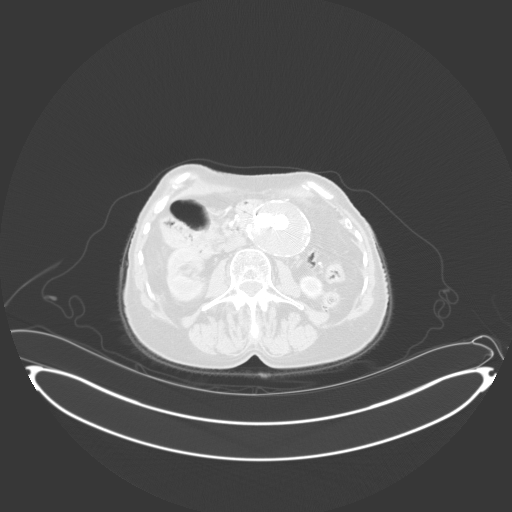
[im 95/189  soft-tissue]
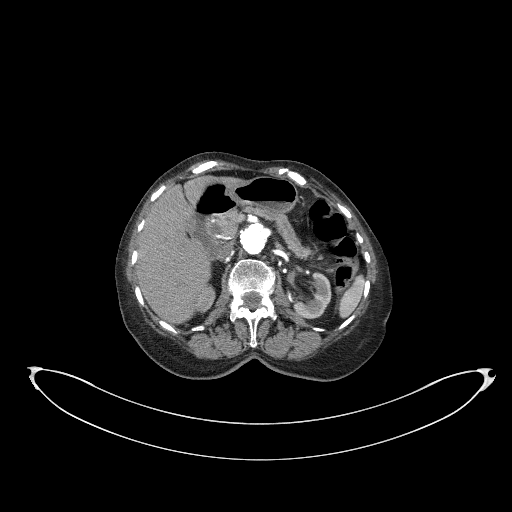
[im 106/189  lung]
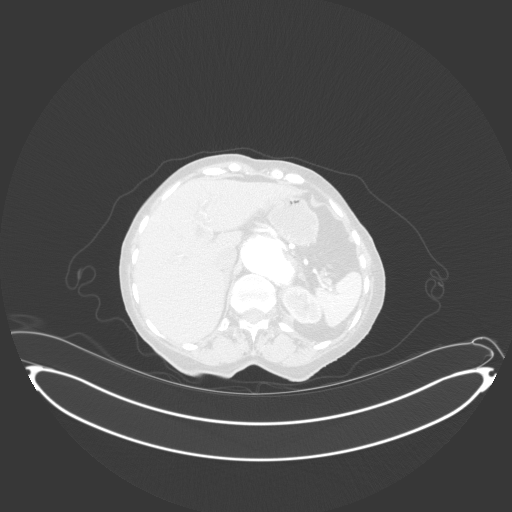
[im 130/189  soft-tissue]
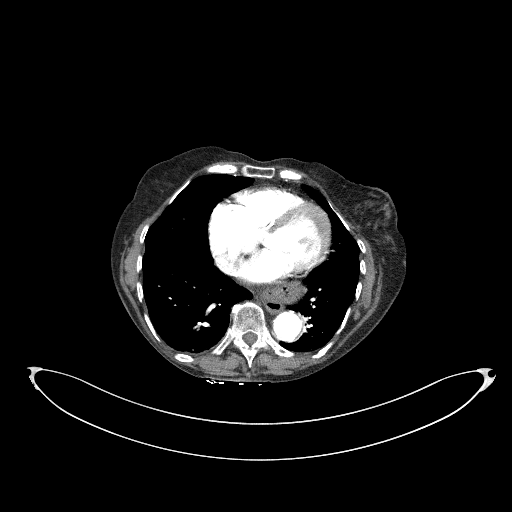
[im 142/189  lung]
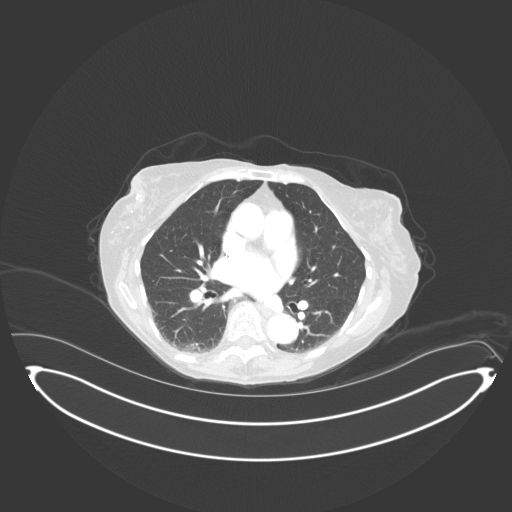
[im 153/189  soft-tissue]
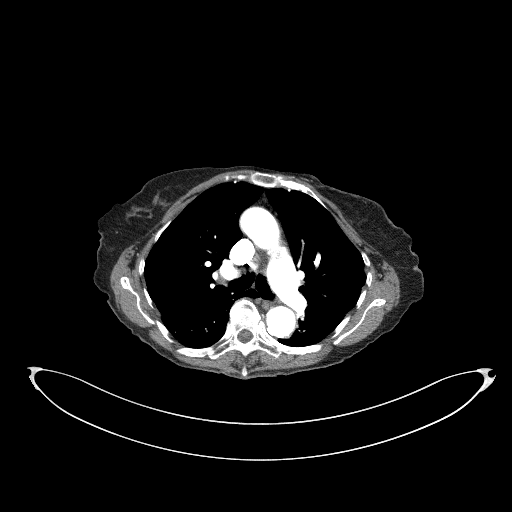
[im 177/189  lung]
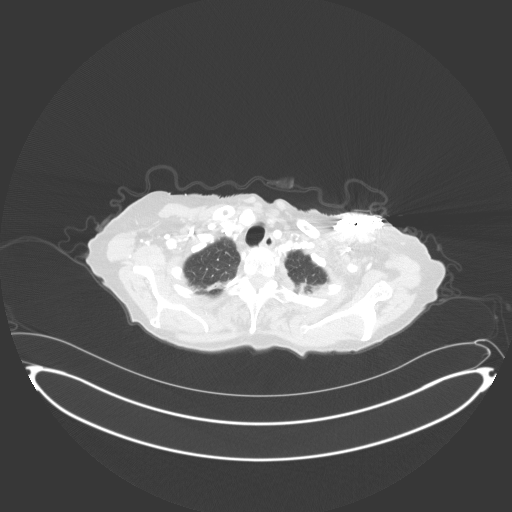

[Series 7: coronal · coronal · 0.84mm/px · 3 of 100 slices shown]
[im 25/100  soft-tissue]
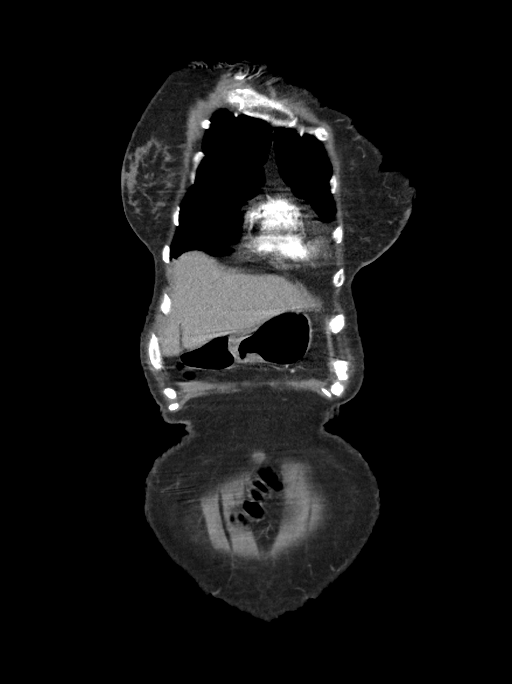
[im 50/100  soft-tissue]
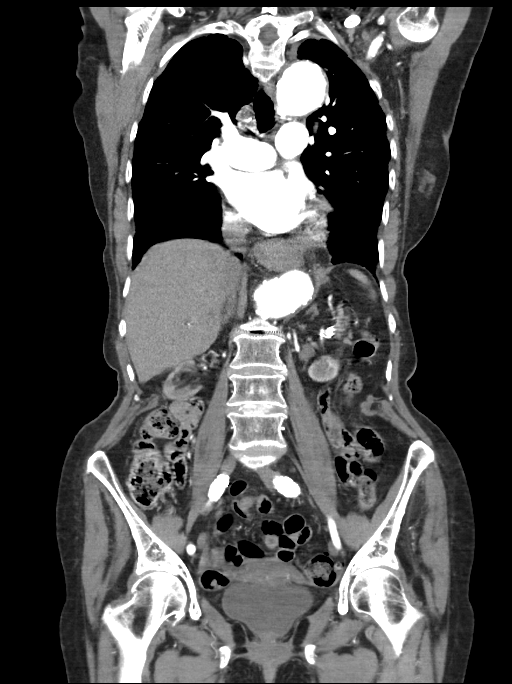
[im 75/100  soft-tissue]
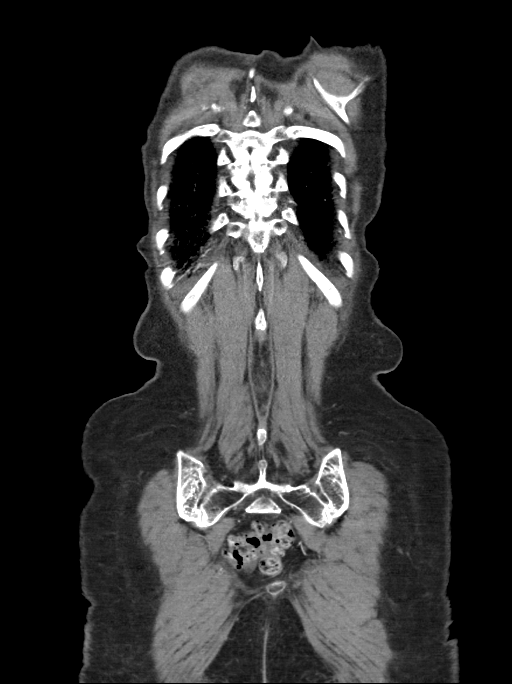

[14 of 46 positions shown; findings below may reference images not displayed]

Multidetector CT imaging through the chest, abdomen and pelvis was
performed using the standard protocol during bolus administration of
intravenous contrast. Multiplanar reconstructed images and MIPs were
obtained and reviewed to evaluate the vascular anatomy.

RADIATION DOSE REDUCTION: This exam was performed according to the
departmental dose-optimization program which includes automated
exposure control, adjustment of the mA and/or kV according to
patient size and/or use of iterative reconstruction technique.

CONTRAST:  80mL OMNIPAQUE IOHEXOL 350 MG/ML SOLN
FINDINGS: CTA CHEST FINDINGS

Cardiovascular: The thoracic aorta is well opacified. Mild pulsation
artifact. No dissection. The ascending aorta is of normal caliber
measuring 3.1 cm in greatest diameter. Type 3 aortic arch. Bovine
arch anatomy with common trunk of the left common carotid and
innominate arteries. Moderate atherosclerotic calcification of the
arch vessel origins without hemodynamically significant stenosis.
The proximal descending thoracic aorta is dilated measuring 4.7 x
4.2 cm (previously 4.4 x 4.3 cm) cm in greatest dimension. Distally,
at the level of the left ventricle, the vessel measures 2.7 x 3.0 cm
greatest dimension.

Moderate multi-vessel coronary artery calcification. Global cardiac
size is within normal limits. Left subclavian pacemaker leads are
seen within the right atrium and right ventricle. No pericardial
effusion. Central pulmonary arteries are enlarged in keeping with
changes of pulmonary arterial hypertension, stable since prior
examination.

Mediastinum/Nodes: No enlarged mediastinal, hilar, or axillary lymph
nodes. Thyroid gland, trachea, and esophagus demonstrate no
significant findings. Moderate hiatal hernia

Lungs/Pleura: Lungs are clear. No pleural effusion or pneumothorax.

Musculoskeletal: No acute bone abnormality. Osseous structures are
age-appropriate. No lytic or blastic bone lesion.

Review of the MIP images confirms the above findings.

CTA ABDOMEN AND PELVIS FINDINGS

VASCULAR

Aorta: There is a fusiform aneurysm of the juxtarenal abdominal
aorta measuring 4.1 x 4.3 cm. Moderate angulation of this segment of
the abdominal aorta is again noted. The celiac and superior
mesenteric artery arise from the left lateral aspect of the
aneurysmal segment. This appears dilated when compared to prior
examination (previously measuring 3.7 x 3.9 cm).

Bifurcated endovascular stent graft repair of the infrarenal
abdominal aortic aneurysm has been performed demonstrating
infrarenal fixation proximally and distal landing zones within the
distal common iliac arteries bilaterally. There has been mild
interval increase in the size of the aneurysm sac, measuring 5.2 x
5.3 cm in greatest dimension (image # 37/7 and # 59/8), previously
measuring 4.9 x 4.9 cm with measured in similar fashion.

There are small foci of hyperattenuation noted within the aneurysm
sac, best appreciated on image # 101/4 posteriorly and image # 120/4
inferiorly. Precontrast imaging was not performed to definitively
characterize this as enhancement, however, should this represent to
enhancement, these likely represent type 2 B and type 2A endoleaks,
respectively as the inferior endoleak is in close proximity to the
origin of the inferior mesenteric artery.

Celiac: Less than 50% stenosis at its origin. Otherwise widely
patent.

SMA: Widely patent.  No aneurysm or dissection.

Renals: Greater than 50% stenosis of the right renal artery which
arises anteriorly of the abdominal aorta. Greater than 50% stenosis
of the left renal artery at its origin. Normal vascular morphology.
No aneurysm or dissection.

IMA: Reconstituted via the arc of [place].

Inflow: No evidence of hemodynamically significant stenosis,
aneurysm, or dissection. Internal iliac arteries are patent
bilaterally.

Veins: No obvious venous abnormality within the limitations of this
arterial phase study.

Review of the MIP images confirms the above findings.

NON-VASCULAR

Hepatobiliary: Status post cholecystectomy. Liver unremarkable. Mild
intra and extrahepatic biliary ductal dilation may represent post
cholecystectomy change.

Pancreas: Unremarkable

Spleen: Unremarkable

Adrenals/Urinary Tract: Mild bilateral renal cortical atrophy with
asymmetric cortical scarring no hydronephrosis. No intrarenal or
ureteral calculi. Adrenal glands are unremarkable. Bladder contains
a small amount of nondependent gas which may relate to recent
catheterization, but is nonspecific.

Stomach/Bowel: Severe sigmoid diverticulosis without superimposed
acute inflammatory change. Stomach, small bowel, and large bowel are
otherwise unremarkable. No free intraperitoneal gas or fluid.

Lymphatic: No pathologic adenopathy within the abdomen and pelvis.

Reproductive: Uterus and bilateral adnexa are unremarkable.

Other: No abdominal wall hernia

Musculoskeletal: No acute bone abnormality.

Review of the MIP images confirms the above findings.
IMPRESSION: Thoracic aortic aneurysm involving the proximal descending thoracic
aorta demonstrating interval increase in size since prior
examination now demonstrating maximal diameter of 4.7 x 4.2 cm
(previously measuring 4.3 x 4.4 cm). Recommend semi-annual imaging
followup by CTA or MRA and referral to cardiothoracic surgery if not
already obtained. This recommendation follows 5020
ACCF/AHA/AATS/ACR/ASA/SCA/LALOT/YOSHIZAWA/CARRAWAY/CHIRING Guidelines for the
Diagnosis and Management of Patients With Thoracic Aortic Disease.
Circulation. 5020; 121: E266-e36. Aortic aneurysm NOS (DLTF0-7TD.Y)

Fusiform, moderately angulated juxtarenal abdominal aortic aneurysm
measuring 4.1 x 4.3 cm in greatest dimension, enlarged since prior
examination (previously measuring 3.9 x 3.9 cm). Note that the
celiac axis and superior mesenteric artery arise from the left
lateral margin of the aneurysm sac.

Infrarenal abdominal aortic aneurysm status post bifurcated
endovascular stent graft repair. Interval increase in size of the
aneurysm sac, now measuring 5.2 x 5.3 cm (previously measuring 4.9 x
4.9 cm). Two foci of hyperattenuation within the aneurysm sac now
noted which may represent type 2a and 2b endoleaks though this is
not definitively characterized in absence of precontrast imaging.

Greater than 50% stenosis of the renal arteries bilaterally at their
origins.

Moderate multi-vessel coronary artery calcification.

Morphologic changes in keeping with pulmonary arterial hypertension.

Severe distal colonic diverticulosis without superimposed acute
inflammatory change.

Aortic aneurysm NOS (DLTF0-7TD.Y).

## 2022-08-10 IMAGING — CT CT CTA ABD/PEL W/CM AND/OR W/O CM
2 of 9 series · 12 of 46 positions shown, 14 images · IV contrast (APPLIED)
Comparison: 07/03/2019, 01/16/2018

CLINICAL DATA: Thoracoabdominal aortic aneurysm. Follow-up
examination.

EXAM:
CT ANGIOGRAPHY CHEST, ABDOMEN AND PELVIS
TECHNIQUE: Non-contrast CT of the chest was initially obtained.

[Series 507: aneurysm 1.0 br34 thins · axial · 0.95mm/px · z∈[-444,+23]mm · 9 of 1132 slices shown, 11 images]
[im 99/1132  soft-tissue]
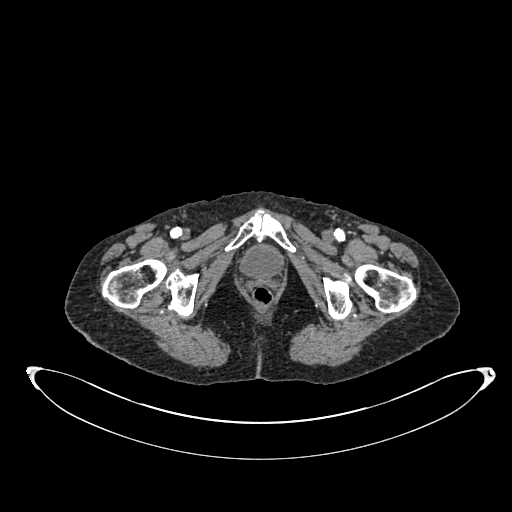
[im 99/1132  bone]
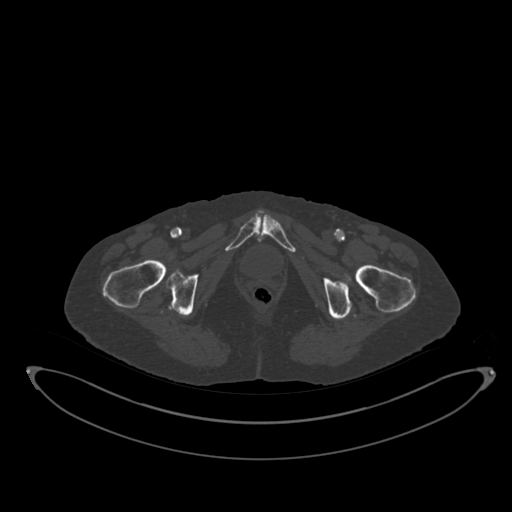
[im 197/1132  soft-tissue]
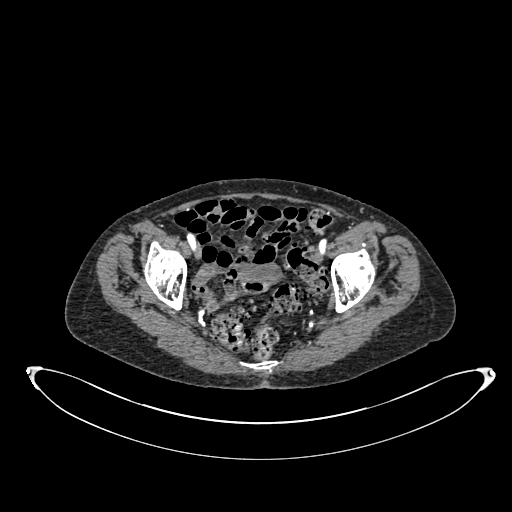
[im 345/1132  soft-tissue]
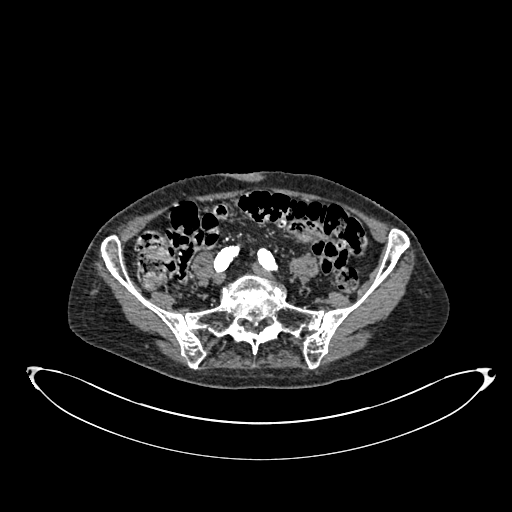
[im 443/1132  soft-tissue]
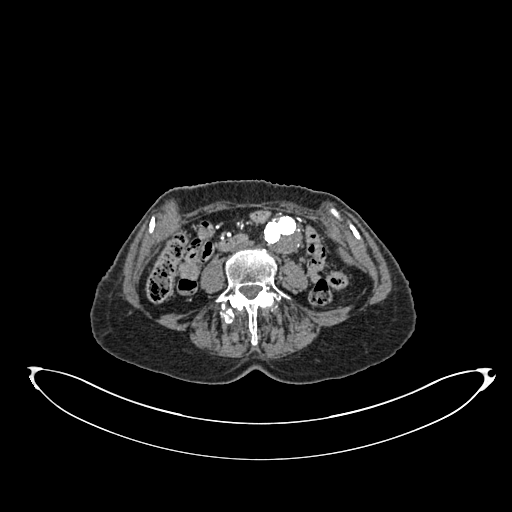
[im 591/1132  soft-tissue]
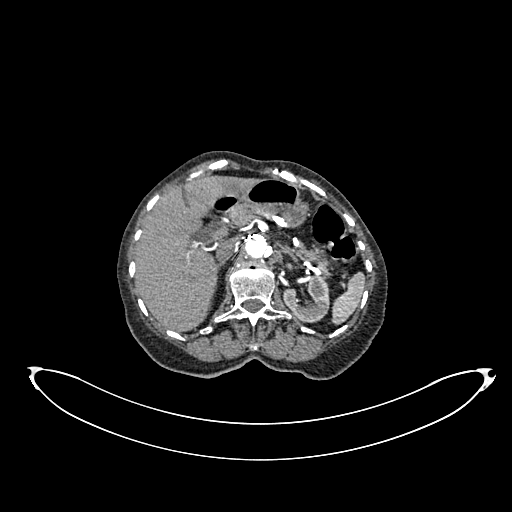
[im 689/1132  soft-tissue]
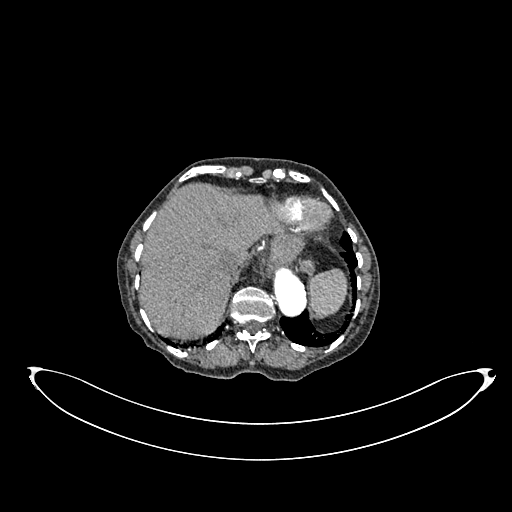
[im 787/1132  soft-tissue]
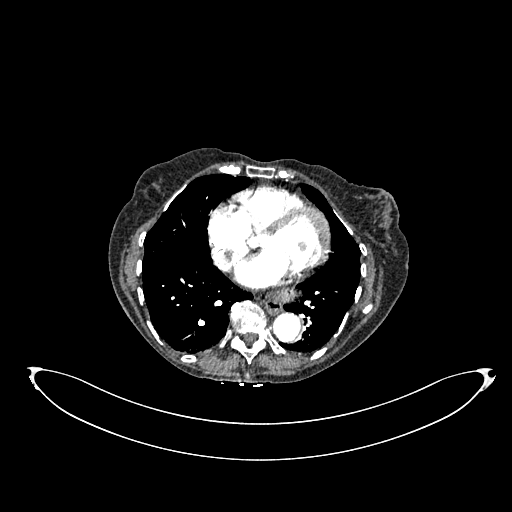
[im 935/1132  soft-tissue]
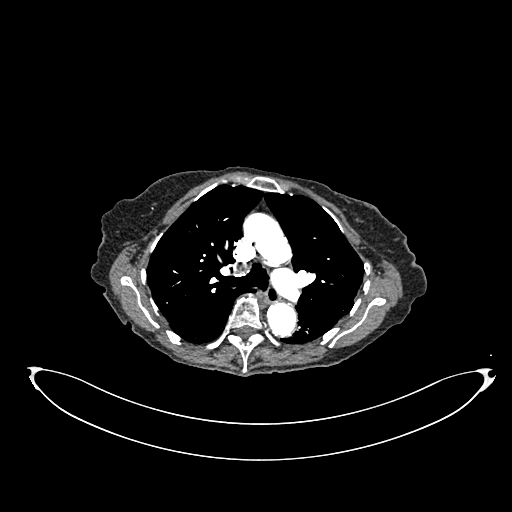
[im 1033/1132  soft-tissue]
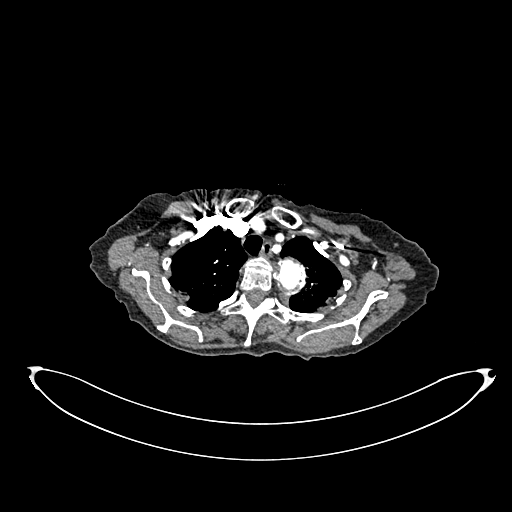
[im 1033/1132  bone]
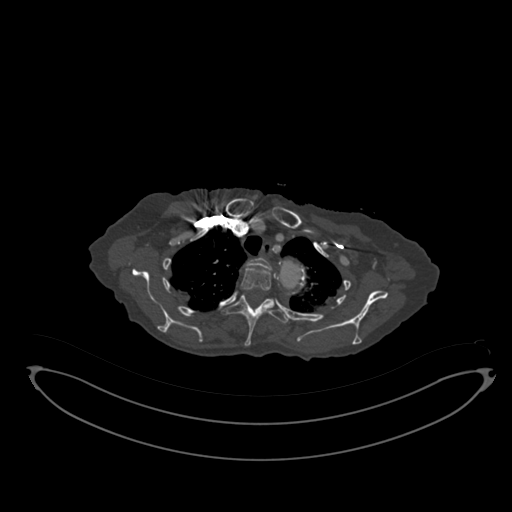

[Series 509: coronal · coronal · 0.84mm/px · 3 of 100 slices shown]
[im 25/100  soft-tissue]
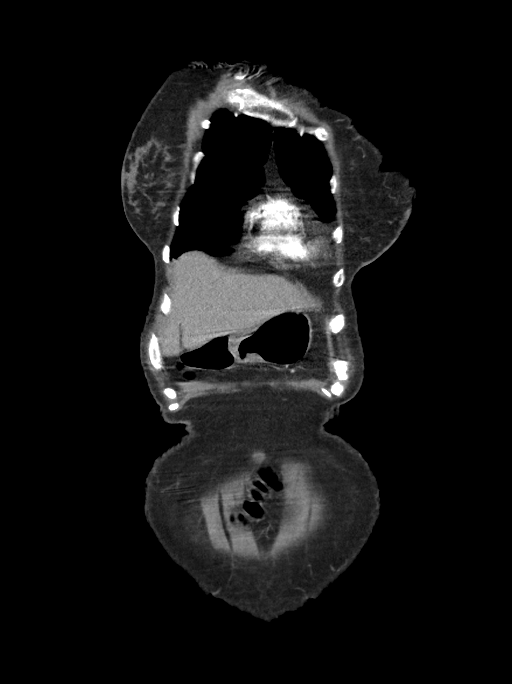
[im 50/100  soft-tissue]
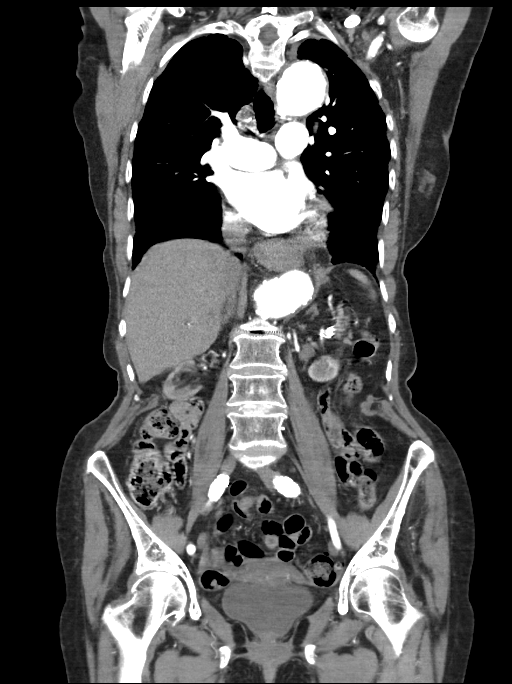
[im 75/100  soft-tissue]
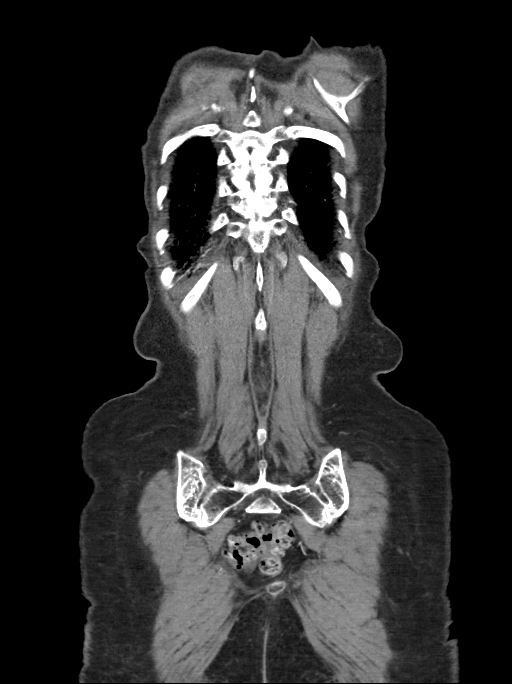

[12 of 46 positions shown; findings below may reference images not displayed]

Multidetector CT imaging through the chest, abdomen and pelvis was
performed using the standard protocol during bolus administration of
intravenous contrast. Multiplanar reconstructed images and MIPs were
obtained and reviewed to evaluate the vascular anatomy.

RADIATION DOSE REDUCTION: This exam was performed according to the
departmental dose-optimization program which includes automated
exposure control, adjustment of the mA and/or kV according to
patient size and/or use of iterative reconstruction technique.

CONTRAST:  80mL OMNIPAQUE IOHEXOL 350 MG/ML SOLN
FINDINGS: CTA CHEST FINDINGS

Cardiovascular: The thoracic aorta is well opacified. Mild pulsation
artifact. No dissection. The ascending aorta is of normal caliber
measuring 3.1 cm in greatest diameter. Type 3 aortic arch. Bovine
arch anatomy with common trunk of the left common carotid and
innominate arteries. Moderate atherosclerotic calcification of the
arch vessel origins without hemodynamically significant stenosis.
The proximal descending thoracic aorta is dilated measuring 4.7 x
4.2 cm (previously 4.4 x 4.3 cm) cm in greatest dimension. Distally,
at the level of the left ventricle, the vessel measures 2.7 x 3.0 cm
greatest dimension.

Moderate multi-vessel coronary artery calcification. Global cardiac
size is within normal limits. Left subclavian pacemaker leads are
seen within the right atrium and right ventricle. No pericardial
effusion. Central pulmonary arteries are enlarged in keeping with
changes of pulmonary arterial hypertension, stable since prior
examination.

Mediastinum/Nodes: No enlarged mediastinal, hilar, or axillary lymph
nodes. Thyroid gland, trachea, and esophagus demonstrate no
significant findings. Moderate hiatal hernia

Lungs/Pleura: Lungs are clear. No pleural effusion or pneumothorax.

Musculoskeletal: No acute bone abnormality. Osseous structures are
age-appropriate. No lytic or blastic bone lesion.

Review of the MIP images confirms the above findings.

CTA ABDOMEN AND PELVIS FINDINGS

VASCULAR

Aorta: There is a fusiform aneurysm of the juxtarenal abdominal
aorta measuring 4.1 x 4.3 cm. Moderate angulation of this segment of
the abdominal aorta is again noted. The celiac and superior
mesenteric artery arise from the left lateral aspect of the
aneurysmal segment. This appears dilated when compared to prior
examination (previously measuring 3.7 x 3.9 cm).

Bifurcated endovascular stent graft repair of the infrarenal
abdominal aortic aneurysm has been performed demonstrating
infrarenal fixation proximally and distal landing zones within the
distal common iliac arteries bilaterally. There has been mild
interval increase in the size of the aneurysm sac, measuring 5.2 x
5.3 cm in greatest dimension (image # 37/7 and # 59/8), previously
measuring 4.9 x 4.9 cm with measured in similar fashion.

There are small foci of hyperattenuation noted within the aneurysm
sac, best appreciated on image # 101/4 posteriorly and image # 120/4
inferiorly. Precontrast imaging was not performed to definitively
characterize this as enhancement, however, should this represent to
enhancement, these likely represent type 2 B and type 2A endoleaks,
respectively as the inferior endoleak is in close proximity to the
origin of the inferior mesenteric artery.

Celiac: Less than 50% stenosis at its origin. Otherwise widely
patent.

SMA: Widely patent.  No aneurysm or dissection.

Renals: Greater than 50% stenosis of the right renal artery which
arises anteriorly of the abdominal aorta. Greater than 50% stenosis
of the left renal artery at its origin. Normal vascular morphology.
No aneurysm or dissection.

IMA: Reconstituted via the arc of [place].

Inflow: No evidence of hemodynamically significant stenosis,
aneurysm, or dissection. Internal iliac arteries are patent
bilaterally.

Veins: No obvious venous abnormality within the limitations of this
arterial phase study.

Review of the MIP images confirms the above findings.

NON-VASCULAR

Hepatobiliary: Status post cholecystectomy. Liver unremarkable. Mild
intra and extrahepatic biliary ductal dilation may represent post
cholecystectomy change.

Pancreas: Unremarkable

Spleen: Unremarkable

Adrenals/Urinary Tract: Mild bilateral renal cortical atrophy with
asymmetric cortical scarring no hydronephrosis. No intrarenal or
ureteral calculi. Adrenal glands are unremarkable. Bladder contains
a small amount of nondependent gas which may relate to recent
catheterization, but is nonspecific.

Stomach/Bowel: Severe sigmoid diverticulosis without superimposed
acute inflammatory change. Stomach, small bowel, and large bowel are
otherwise unremarkable. No free intraperitoneal gas or fluid.

Lymphatic: No pathologic adenopathy within the abdomen and pelvis.

Reproductive: Uterus and bilateral adnexa are unremarkable.

Other: No abdominal wall hernia

Musculoskeletal: No acute bone abnormality.

Review of the MIP images confirms the above findings.
IMPRESSION: Thoracic aortic aneurysm involving the proximal descending thoracic
aorta demonstrating interval increase in size since prior
examination now demonstrating maximal diameter of 4.7 x 4.2 cm
(previously measuring 4.3 x 4.4 cm). Recommend semi-annual imaging
followup by CTA or MRA and referral to cardiothoracic surgery if not
already obtained. This recommendation follows 5020
ACCF/AHA/AATS/ACR/ASA/SCA/LALOT/YOSHIZAWA/CARRAWAY/CHIRING Guidelines for the
Diagnosis and Management of Patients With Thoracic Aortic Disease.
Circulation. 5020; 121: E266-e36. Aortic aneurysm NOS (DLTF0-7TD.Y)

Fusiform, moderately angulated juxtarenal abdominal aortic aneurysm
measuring 4.1 x 4.3 cm in greatest dimension, enlarged since prior
examination (previously measuring 3.9 x 3.9 cm). Note that the
celiac axis and superior mesenteric artery arise from the left
lateral margin of the aneurysm sac.

Infrarenal abdominal aortic aneurysm status post bifurcated
endovascular stent graft repair. Interval increase in size of the
aneurysm sac, now measuring 5.2 x 5.3 cm (previously measuring 4.9 x
4.9 cm). Two foci of hyperattenuation within the aneurysm sac now
noted which may represent type 2a and 2b endoleaks though this is
not definitively characterized in absence of precontrast imaging.

Greater than 50% stenosis of the renal arteries bilaterally at their
origins.

Moderate multi-vessel coronary artery calcification.

Morphologic changes in keeping with pulmonary arterial hypertension.

Severe distal colonic diverticulosis without superimposed acute
inflammatory change.

Aortic aneurysm NOS (DLTF0-7TD.Y).

## 2022-08-13 NOTE — Progress Notes (Signed)
Remote pacemaker transmission.   

## 2022-08-30 DIAGNOSIS — N1832 Chronic kidney disease, stage 3b: Secondary | ICD-10-CM | POA: Diagnosis not present

## 2022-08-30 DIAGNOSIS — Z0000 Encounter for general adult medical examination without abnormal findings: Secondary | ICD-10-CM | POA: Diagnosis not present

## 2022-08-30 DIAGNOSIS — I4819 Other persistent atrial fibrillation: Secondary | ICD-10-CM | POA: Diagnosis not present

## 2022-08-30 DIAGNOSIS — E7849 Other hyperlipidemia: Secondary | ICD-10-CM | POA: Diagnosis not present

## 2022-08-30 DIAGNOSIS — I10 Essential (primary) hypertension: Secondary | ICD-10-CM | POA: Diagnosis not present

## 2022-08-31 ENCOUNTER — Ambulatory Visit: Payer: Medicare Other | Attending: Internal Medicine | Admitting: Internal Medicine

## 2022-08-31 ENCOUNTER — Encounter: Payer: Self-pay | Admitting: Internal Medicine

## 2022-08-31 VITALS — BP 158/90 | HR 69 | Ht 62.0 in | Wt 100.2 lb

## 2022-08-31 DIAGNOSIS — I442 Atrioventricular block, complete: Secondary | ICD-10-CM

## 2022-08-31 LAB — CUP PACEART INCLINIC DEVICE CHECK
Battery Remaining Longevity: 31 mo
Battery Voltage: 2.92 V
Brady Statistic AP VP Percent: 13.47 %
Brady Statistic AP VS Percent: 0.14 %
Brady Statistic AS VP Percent: 84.32 %
Brady Statistic AS VS Percent: 2.07 %
Brady Statistic RA Percent Paced: 13.48 %
Brady Statistic RV Percent Paced: 97.4 %
Date Time Interrogation Session: 20240716130256
Implantable Lead Connection Status: 753985
Implantable Lead Connection Status: 753985
Implantable Lead Implant Date: 20190528
Implantable Lead Implant Date: 20190528
Implantable Lead Location: 753859
Implantable Lead Location: 753860
Implantable Lead Model: 3830
Implantable Lead Model: 5076
Implantable Pulse Generator Implant Date: 20190528
Lead Channel Impedance Value: 361 Ohm
Lead Channel Impedance Value: 399 Ohm
Lead Channel Impedance Value: 437 Ohm
Lead Channel Impedance Value: 551 Ohm
Lead Channel Pacing Threshold Amplitude: 1.25 V
Lead Channel Pacing Threshold Amplitude: 1.375 V
Lead Channel Pacing Threshold Pulse Width: 0.4 ms
Lead Channel Pacing Threshold Pulse Width: 0.4 ms
Lead Channel Sensing Intrinsic Amplitude: 1.625 mV
Lead Channel Sensing Intrinsic Amplitude: 1.625 mV
Lead Channel Sensing Intrinsic Amplitude: 14.375 mV
Lead Channel Sensing Intrinsic Amplitude: 18.625 mV
Lead Channel Setting Pacing Amplitude: 2.5 V
Lead Channel Setting Pacing Amplitude: 4 V
Lead Channel Setting Pacing Pulse Width: 0.4 ms
Lead Channel Setting Sensing Sensitivity: 1.2 mV
Zone Setting Status: 755011
Zone Setting Status: 755011

## 2022-08-31 NOTE — Progress Notes (Signed)
HPI Deborah Moses returns today for followup. She is a pleasant 89 yo woman with CHB, s/p PPM insertion, peripheral vascular disease and AAA. She has had worsening in her AAA and underwent endovascular repair. She is limited by claudication. She denies chest pain or sob. No edema. She has some elevated pacing threshold in the atrium. no chest pain or sob . Her bp is better at home.  No Known Allergies   Current Outpatient Medications  Medication Sig Dispense Refill   acetaminophen (TYLENOL) 500 MG tablet Take 500 mg by mouth every 6 (six) hours as needed for moderate pain.      alendronate (FOSAMAX) 70 MG tablet Take 70 mg by mouth once a week. Take with a full glass of water on an empty stomach.     amLODipine (NORVASC) 5 MG tablet Take 5 mg by mouth daily.     apixaban (ELIQUIS) 2.5 MG TABS tablet Take 1 tablet (2.5 mg total) by mouth 2 (two) times daily. 180 tablet 1   calcium-vitamin D (OSCAL WITH D) 500-200 MG-UNIT TABS tablet Take 1 tablet by mouth daily.      famotidine (PEPCID) 20 MG tablet Take 20 mg by mouth daily.     lisinopril (PRINIVIL,ZESTRIL) 20 MG tablet Take 40 mg by mouth daily.      lovastatin (MEVACOR) 20 MG tablet Take 20 mg by mouth at bedtime.     metoprolol succinate (TOPROL-XL) 50 MG 24 hr tablet Take 1 tablet (50 mg total) by mouth daily. 90 tablet 0   Omega-3 Fatty Acids (FISH OIL) 1000 MG CPDR Take 1,000 mg by mouth in the morning, at noon, and at bedtime.      Turmeric (QC TUMERIC COMPLEX PO) Take 5 mg by mouth daily. Tumeric-curcumin caps 06-998 mg     No current facility-administered medications for this visit.     Past Medical History:  Diagnosis Date   AAA (abdominal aortic aneurysm) (HCC)    Heart block AV third degree (HCC)    HTN (hypertension)    Hyperlipidemia    Vertigo     ROS:   All systems reviewed and negative except as noted in the HPI.   Past Surgical History:  Procedure Laterality Date   ABDOMINAL AORTIC ENDOVASCULAR STENT  GRAFT N/A 08/01/2019   Procedure: ABDOMINAL AORTIC ENDOVASCULAR STENT GRAFT;  Surgeon: Nada Libman, MD;  Location: [facility] OR;  Service: Vascular;  Laterality: N/A;   APPENDECTOMY     per pt she might've gotten her appendix and gallbladder out together because that was "standard procedure back then"   CATARACT EXTRACTION W/PHACO Left 02/22/2022   Procedure: CATARACT EXTRACTION PHACO AND INTRAOCULAR LENS PLACEMENT (IOC);  Surgeon: Fabio Pierce, MD;  Location: [facility];  Service: Ophthalmology;  Laterality: Left;  CDE: 25.37   CATARACT EXTRACTION W/PHACO Right 06/07/2022   Procedure: CATARACT EXTRACTION PHACO AND INTRAOCULAR LENS PLACEMENT (IOC);  Surgeon: Fabio Pierce, MD;  Location: [facility];  Service: Ophthalmology;  Laterality: Right;  CDE: 22.88   CHOLECYSTECTOMY     PACEMAKER IMPLANT N/A 07/12/2017   Procedure: PACEMAKER IMPLANT;  Surgeon: Marinus Maw, MD;  Location: [facility];  Service: Cardiovascular;  Laterality: N/A;   TONSILLECTOMY     ULTRASOUND GUIDANCE FOR VASCULAR ACCESS N/A 08/01/2019   Procedure: ULTRASOUND GUIDANCE FOR VASCULAR ACCESS;  Surgeon: Nada Libman, MD;  Location: [facility] OR;  Service: Vascular;  Laterality: N/A;     Family History  Problem Relation Age of  Onset   Heart failure Father      Social History   Socioeconomic History   Marital status: Widowed    Spouse name: Not on file   Number of children: 4   Years of education: Not on file   Highest education level: Not on file  Occupational History   Not on file  Tobacco Use   Smoking status: Never   Smokeless tobacco: Never  Vaping Use   Vaping status: Never Used  Substance and Sexual Activity   Alcohol use: Never   Drug use: Never   Sexual activity: Not on file  Other Topics Concern   Not on file  Social History Narrative   Not on file   Social Determinants of Health   Financial Resource Strain: Not on file  Food Insecurity: Not on file  Transportation Needs: Not on file  Physical  Activity: Not on file  Stress: Not on file  Social Connections: Not on file  Intimate Partner Violence: Not on file     BP (!) 158/90 (BP Location: Left Arm, Patient Position: Sitting, Cuff Size: Normal)   Pulse 69   Ht 5\' 2"  (1.575 m)   Wt 100 lb 3.2 oz (45.5 kg)   SpO2 97%   BMI 18.33 kg/m   Physical Exam:  Well appearing NAD HEENT: Unremarkable Neck:  No JVD, no thyromegally Lymphatics:  No adenopathy Back:  No CVA tenderness Lungs:  Clear HEART:  Regular rate rhythm, no murmurs, no rubs, no clicks Abd:  soft, positive bowel sounds, no organomegally, no rebound, no guarding Ext:  2 plus pulses, no edema, no cyanosis, no clubbing Skin:  No rashes no nodules Neuro:  CN II through XII intact, motor grossly intact  EKG - nsr with p synchronous ventricular pacing  DEVICE  Normal device function.  See PaceArt for details.   Assess/Plan:  1. CHB - she is asymptomatic, s/p PPM insertion. 2. PPM - her atrial threshold is elevated but she is not using her atrial lead much. She will undergo watchful waiting. We adjusted her output today to maximize longevity 3. HTN - her bp is well controlled. 4. PAF - she is in NSR 99.1%. She will continue low dose eliquis.    Deborah Moses.D.

## 2022-08-31 NOTE — Patient Instructions (Signed)

## 2022-09-02 ENCOUNTER — Other Ambulatory Visit: Payer: Self-pay

## 2022-09-02 DIAGNOSIS — I7123 Aneurysm of the descending thoracic aorta, without rupture: Secondary | ICD-10-CM

## 2022-09-02 DIAGNOSIS — I7143 Infrarenal abdominal aortic aneurysm, without rupture: Secondary | ICD-10-CM

## 2022-09-13 DIAGNOSIS — H26493 Other secondary cataract, bilateral: Secondary | ICD-10-CM | POA: Diagnosis not present

## 2022-09-13 DIAGNOSIS — Z961 Presence of intraocular lens: Secondary | ICD-10-CM | POA: Diagnosis not present

## 2022-09-13 DIAGNOSIS — H35371 Puckering of macula, right eye: Secondary | ICD-10-CM | POA: Diagnosis not present

## 2022-09-16 ENCOUNTER — Other Ambulatory Visit: Payer: Self-pay

## 2022-09-16 DIAGNOSIS — I7143 Infrarenal abdominal aortic aneurysm, without rupture: Secondary | ICD-10-CM

## 2022-09-16 DIAGNOSIS — I7123 Aneurysm of the descending thoracic aorta, without rupture: Secondary | ICD-10-CM

## 2022-09-16 NOTE — Addendum Note (Signed)
Addended by: Yolonda Kida on: 09/16/2022 03:16 PM   Modules accepted: Orders

## 2022-09-20 ENCOUNTER — Other Ambulatory Visit: Payer: Self-pay | Admitting: Surgery

## 2022-09-20 ENCOUNTER — Ambulatory Visit (HOSPITAL_COMMUNITY)
Admission: RE | Admit: 2022-09-20 | Discharge: 2022-09-20 | Disposition: A | Discharge status: Home / Self Care | Payer: Medicare Other | Source: Ambulatory Visit | Attending: Surgery | Admitting: Surgery

## 2022-09-20 DIAGNOSIS — I7143 Infrarenal abdominal aortic aneurysm, without rupture: Secondary | ICD-10-CM

## 2022-09-20 DIAGNOSIS — K5730 Diverticulosis of large intestine without perforation or abscess without bleeding: Secondary | ICD-10-CM | POA: Diagnosis not present

## 2022-09-20 DIAGNOSIS — I7123 Aneurysm of the descending thoracic aorta, without rupture: Secondary | ICD-10-CM

## 2022-09-20 DIAGNOSIS — I2510 Atherosclerotic heart disease of native coronary artery without angina pectoris: Secondary | ICD-10-CM | POA: Diagnosis not present

## 2022-09-20 LAB — POCT I-STAT CREATININE: Creatinine, Ser: 2.1 mg/dL — ABNORMAL HIGH (ref 0.44–1.00)

## 2022-09-25 ENCOUNTER — Telehealth: Payer: Self-pay | Admitting: Vascular Surgery

## 2022-09-25 NOTE — Telephone Encounter (Signed)
Called by radiology re new CTA demonstrating what appears to be type B aortic dissection which occurred in the last year. Called pt in an attempt discus if she has any concerning symptoms - No answer.  Have reached out to my office for clinic visit Monday in an effort to assess and discuss CTA.

## 2022-09-27 ENCOUNTER — Encounter: Payer: Self-pay | Admitting: Surgery

## 2022-09-27 ENCOUNTER — Ambulatory Visit: Payer: Medicare Other | Admitting: Surgery

## 2022-09-27 ENCOUNTER — Ambulatory Visit (HOSPITAL_COMMUNITY)
Admission: RE | Admit: 2022-09-27 | Discharge: 2022-09-27 | Disposition: A | Discharge status: Home / Self Care | Payer: Medicare Other | Source: Ambulatory Visit | Attending: Surgery | Admitting: Surgery

## 2022-09-27 VITALS — BP 178/88 | HR 59 | Temp 97.7°F | Ht 62.0 in | Wt 99.9 lb

## 2022-09-27 DIAGNOSIS — I7123 Aneurysm of the descending thoracic aorta, without rupture: Secondary | ICD-10-CM

## 2022-09-27 DIAGNOSIS — I7143 Infrarenal abdominal aortic aneurysm, without rupture: Secondary | ICD-10-CM | POA: Insufficient documentation

## 2022-09-27 DIAGNOSIS — H40013 Open angle with borderline findings, low risk, bilateral: Secondary | ICD-10-CM | POA: Diagnosis not present

## 2022-09-27 NOTE — Progress Notes (Signed)
[facility]  Patient name: Deborah Moses MRN: [id number] DOB: December 28, 1931 Sex: female   REASON FOR VISIT:    Follow up  HISOTRY OF PRESENT ILLNESS:    Deborah Moses is a 89 y.o. female who is status post endovascular repair of an abdominal aortic aneurysm with MAC 5.2 cm on 08/01/2019.  Her postoperative course located.  She also thoracic aneurysm.  Maximum thoracic aortic diameter is 4.7 cm.  She has not been able to obtain a CT scan with contrast due to renal issues.  She denies any chest pain or abdominal pain  She has diminished ABI's in the 0.5 range.  She does have claudication symptoms when she walks outside however mostly she is inside and does not have calf cramping.    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  She is a non-smoker.  She is medically managed for hypertension.  She takes a statin for hypercholesterolemia.  She is on Eliquis for AFIB   PAST MEDICAL HISTORY:   Past Medical History:  Diagnosis Date   AAA (abdominal aortic aneurysm) (HCC)    Heart block AV third degree (HCC)    HTN (hypertension)    Hyperlipidemia    Vertigo      FAMILY HISTORY:   Family History  Problem Relation Age of Onset   Heart failure Father     SOCIAL HISTORY:   Social History   Tobacco Use   Smoking status: Never   Smokeless tobacco: Never  Substance Use Topics   Alcohol use: Never     ALLERGIES:   No Known Allergies   CURRENT MEDICATIONS:   Current Outpatient Medications  Medication Sig Dispense Refill   acetaminophen (TYLENOL) 500 MG tablet Take 500 mg by mouth every 6 (six) hours as needed for moderate pain.      alendronate (FOSAMAX) 70 MG tablet Take 70 mg by mouth once a week. Take with a full glass of water on an empty stomach.     amLODipine (NORVASC) 5 MG tablet Take 5 mg by mouth daily.     apixaban (ELIQUIS) 2.5 MG TABS tablet Take 1 tablet  (2.5 mg total) by mouth 2 (two) times daily. 180 tablet 1   calcium-vitamin D (OSCAL WITH D) 500-200 MG-UNIT TABS tablet Take 1 tablet by mouth daily.      famotidine (PEPCID) 20 MG tablet Take 20 mg by mouth daily.     lovastatin (MEVACOR) 20 MG tablet Take 20 mg by mouth at bedtime.     metoprolol succinate (TOPROL-XL) 50 MG 24 hr tablet Take 1 tablet (50 mg total) by mouth daily. 90 tablet 0   olmesartan (BENICAR) 20 MG tablet Take 20 mg by mouth daily.     Omega-3 Fatty Acids (FISH OIL) 1000 MG CPDR Take 1,000 mg by mouth in the morning, at noon, and at bedtime.      Turmeric (QC TUMERIC COMPLEX PO) Take 5 mg by mouth daily. Tumeric-curcumin caps 06-998 mg     lisinopril (PRINIVIL,ZESTRIL) 20 MG tablet Take 40 mg by mouth daily.      No current facility-administered medications for this visit.    REVIEW OF SYSTEMS:   [X]  denotes positive finding, [ ]  denotes negative finding Cardiac  Comments:  Chest pain or chest pressure:    Shortness of breath upon exertion:    Short of breath when lying flat:    Irregular heart rhythm:  Vascular    Pain in calf, thigh, or hip brought on by ambulation: x   Pain in feet at night that wakes you up from your sleep:     Blood clot in your veins:    Leg swelling:         Pulmonary    Oxygen at home:    Productive cough:     Wheezing:         Neurologic    Sudden weakness in arms or legs:     Sudden numbness in arms or legs:     Sudden onset of difficulty speaking or slurred speech:    Temporary loss of vision in one eye:     Problems with dizziness:         Gastrointestinal    Blood in stool:     Vomited blood:         Genitourinary    Burning when urinating:     Blood in urine:        Psychiatric    Major depression:         Hematologic    Bleeding problems:    Problems with blood clotting too easily:        Skin    Rashes or ulcers:        Constitutional    Fever or chills:      PHYSICAL EXAM:   Vitals:    09/27/22 0845  BP: (!) 178/88  Pulse: (!) 59  Temp: 97.7 F (36.5 C)  TempSrc: Temporal  SpO2: 93%  Weight: 99 lb 14.4 oz (45.3 kg)  Height: 5\' 2"  (1.575 m)    GENERAL: The patient is a well-nourished female, in no acute distress. The vital signs are documented above. CARDIAC: There is a regular rate and rhythm.  VASCULAR: Nonpalpable pedal pulses PULMONARY: Non-labored respirations ABDOMEN: Soft and non-tender  MUSCULOSKELETAL: There are no major deformities or cyanosis. NEUROLOGIC: No focal weakness or paresthesias are detected. SKIN: There are no ulcers or rashes noted. PSYCHIATRIC: The patient has a normal affect.  STUDIES:   I have reviewed her CT scan with the following findings: 1. Interval development of displaced intimal calcifications in the thoracic aorta. This displacement appears to occur just distal to the takeoff of the left subclavian artery and extends down to the diaphragmatic hiatus without displaced intimal calcification evident in the region of the proximal end of the abdominal aortic stent graft. Although assessment is limited by noncontrast imaging, the ascending thoracic aorta is stable in size compared to the prior study and noncontrast imaging is most suggestive of a type B dissection. Distal extent of the dissection appears to be down to the proximal abdominal aorta although again the dissection flap is occult given noncontrast imaging. There is no periaortic or mediastinal fluid/hemorrhage. There is a trace amount of fluid loculated in the paraspinal left pleural space, but this was present on the study from over a year ago and is only minimally increased in the interval. 2. Status post aortic endograft placement with bilateral common iliac extenders. Native aneurysm sac measures approximately 6.8 x 5.5 cm today compared to 6.1 x 5.2 cm when re-measuring at a similar level on the prior study. No periaortic edema or hemorrhage evident. 3. Small to  moderate hiatal hernia. 4.  Aortic Atherosclerosis (ICD10-I70.0).   Abdominal ultrasound Abdominal Aorta: The largest aortic diameter remains appears increased  compared to prior exam. Previous diameter measurement was 5.3 x 5.87 cm  obtained on 04/20/2021. Todays  measurement is 5.3 x 6.11 cm.     MEDICAL ISSUES:   AAA: Maximum diameter by CT scan has increased to 6.8 cm from 6.1 cm.  Ultrasound does show what appears to be a type II endoleak.  This was not well-evaluated on CT scan because of the inability to get contrast.  TAAA: Based off noncontrasted imaging, there appears to be a interval change.  She has what looks like a dissection.  Again, this is not well-evaluated due to lack of contrast  I discussed with the patient that I think it is critical that we get a contrasted CT scan to see if she has surgical options in the thoracic aorta and abdominal aorta.  I have told her to hydrate as much as she can and I will get a reduced contrast CT angiogram of the abdomen chest to look at her new thoracic aortic dissection as well as her endoleak from her abdominal aortic.  She will follow-up in a few weeks once this has been completed.  Charlena Cross, MD, FACS [facility] [phone] Pager [phone]

## 2022-09-28 ENCOUNTER — Other Ambulatory Visit: Payer: Self-pay

## 2022-09-28 DIAGNOSIS — I7143 Infrarenal abdominal aortic aneurysm, without rupture: Secondary | ICD-10-CM

## 2022-09-28 DIAGNOSIS — I712 Thoracic aortic aneurysm, without rupture, unspecified: Secondary | ICD-10-CM

## 2022-09-28 NOTE — Addendum Note (Signed)
Addended by: Leilani Able, Kailei Cowens A on: 09/28/2022 03:44 PM   Modules accepted: Orders

## 2022-10-21 ENCOUNTER — Ambulatory Visit (INDEPENDENT_AMBULATORY_CARE_PROVIDER_SITE_OTHER): Payer: Medicare Other

## 2022-10-21 DIAGNOSIS — I442 Atrioventricular block, complete: Secondary | ICD-10-CM | POA: Diagnosis not present

## 2022-10-21 LAB — CUP PACEART REMOTE DEVICE CHECK
Battery Remaining Longevity: 32 mo
Battery Voltage: 2.92 V
Brady Statistic AP VP Percent: 10.31 %
Brady Statistic AP VS Percent: 0.27 %
Brady Statistic AS VP Percent: 82.65 %
Brady Statistic AS VS Percent: 6.79 %
Brady Statistic RA Percent Paced: 9.59 %
Brady Statistic RV Percent Paced: 85.17 %
Date Time Interrogation Session: 20240905031759
Implantable Lead Connection Status: 753985
Implantable Lead Connection Status: 753985
Implantable Lead Implant Date: 20190528
Implantable Lead Implant Date: 20190528
Implantable Lead Location: 753859
Implantable Lead Location: 753860
Implantable Lead Model: 3830
Implantable Lead Model: 5076
Implantable Pulse Generator Implant Date: 20190528
Lead Channel Impedance Value: 323 Ohm
Lead Channel Impedance Value: 342 Ohm
Lead Channel Impedance Value: 380 Ohm
Lead Channel Impedance Value: 456 Ohm
Lead Channel Pacing Threshold Amplitude: 1.125 V
Lead Channel Pacing Threshold Amplitude: 1.375 V
Lead Channel Pacing Threshold Pulse Width: 0.4 ms
Lead Channel Pacing Threshold Pulse Width: 0.4 ms
Lead Channel Sensing Intrinsic Amplitude: 15.625 mV
Lead Channel Sensing Intrinsic Amplitude: 15.625 mV
Lead Channel Sensing Intrinsic Amplitude: 2.75 mV
Lead Channel Sensing Intrinsic Amplitude: 2.75 mV
Lead Channel Setting Pacing Amplitude: 2.5 V
Lead Channel Setting Pacing Amplitude: 4 V
Lead Channel Setting Pacing Pulse Width: 0.4 ms
Lead Channel Setting Sensing Sensitivity: 1.2 mV
Zone Setting Status: 755011
Zone Setting Status: 755011

## 2022-10-25 ENCOUNTER — Ambulatory Visit: Payer: Medicare Other | Admitting: Surgery

## 2022-10-29 NOTE — Progress Notes (Signed)
Remote pacemaker transmission.   

## 2022-11-08 ENCOUNTER — Ambulatory Visit (HOSPITAL_COMMUNITY)
Admission: RE | Admit: 2022-11-08 | Discharge: 2022-11-08 | Disposition: A | Discharge status: Home / Self Care | Payer: Medicare Other | Source: Ambulatory Visit | Attending: Surgery | Admitting: Surgery

## 2022-11-08 ENCOUNTER — Other Ambulatory Visit: Payer: Self-pay | Admitting: Surgery

## 2022-11-08 DIAGNOSIS — I7123 Aneurysm of the descending thoracic aorta, without rupture: Secondary | ICD-10-CM | POA: Diagnosis not present

## 2022-11-08 DIAGNOSIS — I7143 Infrarenal abdominal aortic aneurysm, without rupture: Secondary | ICD-10-CM | POA: Diagnosis not present

## 2022-11-08 DIAGNOSIS — I712 Thoracic aortic aneurysm, without rupture, unspecified: Secondary | ICD-10-CM

## 2022-11-08 DIAGNOSIS — I7122 Aneurysm of the aortic arch, without rupture: Secondary | ICD-10-CM | POA: Diagnosis not present

## 2022-11-08 DIAGNOSIS — K5730 Diverticulosis of large intestine without perforation or abscess without bleeding: Secondary | ICD-10-CM | POA: Diagnosis not present

## 2022-11-08 DIAGNOSIS — I714 Abdominal aortic aneurysm, without rupture, unspecified: Secondary | ICD-10-CM | POA: Diagnosis not present

## 2022-11-08 LAB — POCT I-STAT CREATININE: Creatinine, Ser: 2.3 mg/dL — ABNORMAL HIGH (ref 0.44–1.00)

## 2022-11-15 ENCOUNTER — Encounter: Payer: Self-pay | Admitting: Surgery

## 2022-11-15 ENCOUNTER — Ambulatory Visit: Payer: Medicare Other | Admitting: Surgery

## 2022-11-15 VITALS — BP 173/101 | HR 64 | Temp 97.8°F | Resp 20 | Ht 62.0 in | Wt 97.0 lb

## 2022-11-15 DIAGNOSIS — I7143 Infrarenal abdominal aortic aneurysm, without rupture: Secondary | ICD-10-CM | POA: Diagnosis not present

## 2022-11-15 DIAGNOSIS — I7123 Aneurysm of the descending thoracic aorta, without rupture: Secondary | ICD-10-CM

## 2022-11-15 NOTE — Progress Notes (Signed)
Vascular and Vein Specialist of [facility]  Patient name: Deborah Moses MRN: [id number] DOB: 28-Oct-1931 Sex: female   REASON FOR VISIT:    Follow up  HISOTRY OF PRESENT ILLNESS:   Deborah Moses is a 89 y.o. female who is status post endovascular repair of an abdominal aortic aneurysm with MAC 5.2 cm on 08/01/2019.  Her postoperative course located.  She also thoracic aneurysm.  Maximum thoracic aortic diameter is 4.7 cm.  She has not been able to obtain a CT scan with contrast due to renal issues.  She denies any chest pain or abdominal pain   She has diminished ABI's in the 0.5 range.  She does have claudication symptoms when she walks outside however mostly she is inside and does not have calf cramping.    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  She is a non-smoker.  She is medically managed for hypertension.  She takes a statin for hypercholesterolemia.  She is on Eliquis for AFIB   PAST MEDICAL HISTORY:   Past Medical History:  Diagnosis Date   AAA (abdominal aortic aneurysm) (HCC)    Heart block AV third degree (HCC)    HTN (hypertension)    Hyperlipidemia    Vertigo      FAMILY HISTORY:   Family History  Problem Relation Age of Onset   Heart failure Father     SOCIAL HISTORY:   Social History   Tobacco Use   Smoking status: Never   Smokeless tobacco: Never  Substance Use Topics   Alcohol use: Never     ALLERGIES:   No Known Allergies   CURRENT MEDICATIONS:   Current Outpatient Medications  Medication Sig Dispense Refill   acetaminophen (TYLENOL) 500 MG tablet Take 500 mg by mouth every 6 (six) hours as needed for moderate pain.      alendronate (FOSAMAX) 70 MG tablet Take 70 mg by mouth once a week. Take with a full glass of water on an empty stomach.     amLODipine (NORVASC) 5 MG tablet Take 5 mg by mouth daily.     apixaban (ELIQUIS) 2.5 MG TABS tablet Take 1 tablet  (2.5 mg total) by mouth 2 (two) times daily. 180 tablet 1   calcium-vitamin D (OSCAL WITH D) 500-200 MG-UNIT TABS tablet Take 1 tablet by mouth daily.      famotidine (PEPCID) 20 MG tablet Take 20 mg by mouth daily.     lisinopril (PRINIVIL,ZESTRIL) 20 MG tablet Take 40 mg by mouth daily.      lovastatin (MEVACOR) 20 MG tablet Take 20 mg by mouth at bedtime.     metoprolol succinate (TOPROL-XL) 50 MG 24 hr tablet Take 1 tablet (50 mg total) by mouth daily. 90 tablet 0   olmesartan (BENICAR) 20 MG tablet Take 20 mg by mouth daily.     Omega-3 Fatty Acids (FISH OIL) 1000 MG CPDR Take 1,000 mg by mouth in the morning, at noon, and at bedtime.      Turmeric (QC TUMERIC COMPLEX PO) Take 5 mg by mouth daily. Tumeric-curcumin caps 06-998 mg     No current facility-administered medications for this visit.    REVIEW OF SYSTEMS:   [X]  denotes positive finding, [ ]  denotes negative finding Cardiac  Comments:  Chest pain or chest pressure:    Shortness of breath upon exertion:    Short of breath when lying flat:    Irregular heart rhythm:  Vascular    Pain in calf, thigh, or hip brought on by ambulation:    Pain in feet at night that wakes you up from your sleep:     Blood clot in your veins:    Leg swelling:         Pulmonary    Oxygen at home:    Productive cough:     Wheezing:         Neurologic    Sudden weakness in arms or legs:     Sudden numbness in arms or legs:     Sudden onset of difficulty speaking or slurred speech:    Temporary loss of vision in one eye:     Problems with dizziness:         Gastrointestinal    Blood in stool:     Vomited blood:         Genitourinary    Burning when urinating:     Blood in urine:        Psychiatric    Major depression:         Hematologic    Bleeding problems:    Problems with blood clotting too easily:        Skin    Rashes or ulcers:        Constitutional    Fever or chills:      PHYSICAL EXAM:   Vitals:    11/15/22 0858  BP: (!) 173/101  Pulse: 64  Resp: 20  Temp: 97.8 F (36.6 C)  SpO2: 98%  Weight: 97 lb (44 kg)  Height: 5\' 2"  (1.575 m)    GENERAL: The patient is a well-nourished female, in no acute distress. The vital signs are documented above. CARDIAC: There is a regular rate and rhythm.  PULMONARY: Non-labored respirations MUSCULOSKELETAL: There are no major deformities or cyanosis. NEUROLOGIC: No focal weakness or paresthesias are detected. SKIN: There are no ulcers or rashes noted. PSYCHIATRIC: The patient has a normal affect.  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 5 cm aortic arch aneurysm is noted which is not significantly changed compared to prior exam. 4.3 cm proximal descending thoracic aortic aneurysm is noted which is not significantly changed.   Status post endograft repair of abdominal aortic aneurysm. Excluded aneurysmal sac measures 6.2 cm in diameter which is not significantly changed compared to prior exam based on my own measurements.   Minimal left pleural effusion.   Sigmoid diverticulosis without inflammation.   Sigmoid diverticulosis without inflammation.  MEDICAL ISSUES:   Unfortunately, her creatinine has increased and so we were not able to get a contrasted CT scan.  Maximum aortic diameter in the thoracic aorta is 5 cm.  There does appear to be a dissection which was not present on the scan in 2023.  Unfortunately, this cannot be further evaluated because of lack of contrast.  Her abdominal aneurysm is not significantly changed.  I am referring her to nephrology for further evaluation of her progressive renal disease.  I will try to get a contrasted scan again in 6 months.    Deborah Cross, MD, FACS Vascular and Vein Specialists of [facility] [phone] Pager [phone]

## 2022-12-06 DIAGNOSIS — Z0000 Encounter for general adult medical examination without abnormal findings: Secondary | ICD-10-CM | POA: Diagnosis not present

## 2022-12-06 DIAGNOSIS — I4819 Other persistent atrial fibrillation: Secondary | ICD-10-CM | POA: Diagnosis not present

## 2022-12-06 DIAGNOSIS — E215 Disorder of parathyroid gland, unspecified: Secondary | ICD-10-CM | POA: Diagnosis not present

## 2022-12-06 DIAGNOSIS — R630 Anorexia: Secondary | ICD-10-CM | POA: Diagnosis not present

## 2022-12-06 DIAGNOSIS — N1832 Chronic kidney disease, stage 3b: Secondary | ICD-10-CM | POA: Diagnosis not present

## 2022-12-06 DIAGNOSIS — E7849 Other hyperlipidemia: Secondary | ICD-10-CM | POA: Diagnosis not present

## 2022-12-06 DIAGNOSIS — I10 Essential (primary) hypertension: Secondary | ICD-10-CM | POA: Diagnosis not present

## 2022-12-20 DIAGNOSIS — M810 Age-related osteoporosis without current pathological fracture: Secondary | ICD-10-CM | POA: Diagnosis not present

## 2023-01-03 DIAGNOSIS — H40023 Open angle with borderline findings, high risk, bilateral: Secondary | ICD-10-CM | POA: Diagnosis not present

## 2023-01-10 DIAGNOSIS — I129 Hypertensive chronic kidney disease with stage 1 through stage 4 chronic kidney disease, or unspecified chronic kidney disease: Secondary | ICD-10-CM | POA: Diagnosis not present

## 2023-01-10 DIAGNOSIS — E785 Hyperlipidemia, unspecified: Secondary | ICD-10-CM | POA: Diagnosis not present

## 2023-01-10 DIAGNOSIS — I739 Peripheral vascular disease, unspecified: Secondary | ICD-10-CM | POA: Diagnosis not present

## 2023-01-10 DIAGNOSIS — N1832 Chronic kidney disease, stage 3b: Secondary | ICD-10-CM | POA: Diagnosis not present

## 2023-01-20 ENCOUNTER — Ambulatory Visit (INDEPENDENT_AMBULATORY_CARE_PROVIDER_SITE_OTHER): Payer: Medicare Other

## 2023-01-20 DIAGNOSIS — I442 Atrioventricular block, complete: Secondary | ICD-10-CM

## 2023-01-20 LAB — CUP PACEART REMOTE DEVICE CHECK
Battery Remaining Longevity: 38 mo
Battery Voltage: 2.92 V
Brady Statistic AP VP Percent: 14.85 %
Brady Statistic AP VS Percent: 1.4 %
Brady Statistic AS VP Percent: 56.59 %
Brady Statistic AS VS Percent: 27.18 %
Brady Statistic RA Percent Paced: 15.56 %
Brady Statistic RV Percent Paced: 69.11 %
Date Time Interrogation Session: 20241205021746
Implantable Lead Connection Status: 753985
Implantable Lead Connection Status: 753985
Implantable Lead Implant Date: 20190528
Implantable Lead Implant Date: 20190528
Implantable Lead Location: 753859
Implantable Lead Location: 753860
Implantable Lead Model: 3830
Implantable Lead Model: 5076
Implantable Pulse Generator Implant Date: 20190528
Lead Channel Impedance Value: 342 Ohm
Lead Channel Impedance Value: 380 Ohm
Lead Channel Impedance Value: 418 Ohm
Lead Channel Impedance Value: 456 Ohm
Lead Channel Pacing Threshold Amplitude: 1 V
Lead Channel Pacing Threshold Amplitude: 1.375 V
Lead Channel Pacing Threshold Pulse Width: 0.4 ms
Lead Channel Pacing Threshold Pulse Width: 0.4 ms
Lead Channel Sensing Intrinsic Amplitude: 1.75 mV
Lead Channel Sensing Intrinsic Amplitude: 1.75 mV
Lead Channel Sensing Intrinsic Amplitude: 12.375 mV
Lead Channel Sensing Intrinsic Amplitude: 12.375 mV
Lead Channel Setting Pacing Amplitude: 2.5 V
Lead Channel Setting Pacing Amplitude: 4 V
Lead Channel Setting Pacing Pulse Width: 0.4 ms
Lead Channel Setting Sensing Sensitivity: 1.2 mV
Zone Setting Status: 755011
Zone Setting Status: 755011

## 2023-01-31 DIAGNOSIS — N1832 Chronic kidney disease, stage 3b: Secondary | ICD-10-CM | POA: Diagnosis not present

## 2023-02-14 DIAGNOSIS — N1832 Chronic kidney disease, stage 3b: Secondary | ICD-10-CM | POA: Diagnosis not present

## 2023-03-21 DIAGNOSIS — E7849 Other hyperlipidemia: Secondary | ICD-10-CM | POA: Diagnosis not present

## 2023-03-21 DIAGNOSIS — Z0000 Encounter for general adult medical examination without abnormal findings: Secondary | ICD-10-CM | POA: Diagnosis not present

## 2023-03-21 DIAGNOSIS — R630 Anorexia: Secondary | ICD-10-CM | POA: Diagnosis not present

## 2023-03-21 DIAGNOSIS — N184 Chronic kidney disease, stage 4 (severe): Secondary | ICD-10-CM | POA: Diagnosis not present

## 2023-03-21 DIAGNOSIS — I10 Essential (primary) hypertension: Secondary | ICD-10-CM | POA: Diagnosis not present

## 2023-03-28 DIAGNOSIS — I714 Abdominal aortic aneurysm, without rupture, unspecified: Secondary | ICD-10-CM | POA: Diagnosis not present

## 2023-03-28 DIAGNOSIS — I129 Hypertensive chronic kidney disease with stage 1 through stage 4 chronic kidney disease, or unspecified chronic kidney disease: Secondary | ICD-10-CM | POA: Diagnosis not present

## 2023-03-28 DIAGNOSIS — N1832 Chronic kidney disease, stage 3b: Secondary | ICD-10-CM | POA: Diagnosis not present

## 2023-03-28 DIAGNOSIS — I739 Peripheral vascular disease, unspecified: Secondary | ICD-10-CM | POA: Diagnosis not present

## 2023-03-28 DIAGNOSIS — D509 Iron deficiency anemia, unspecified: Secondary | ICD-10-CM | POA: Diagnosis not present

## 2023-04-11 ENCOUNTER — Other Ambulatory Visit: Payer: Self-pay

## 2023-04-11 DIAGNOSIS — I7123 Aneurysm of the descending thoracic aorta, without rupture: Secondary | ICD-10-CM

## 2023-04-14 ENCOUNTER — Telehealth: Payer: Self-pay | Admitting: Pharmacy Technician

## 2023-04-14 ENCOUNTER — Telehealth: Payer: Self-pay | Admitting: Internal Medicine

## 2023-04-14 DIAGNOSIS — I4891 Unspecified atrial fibrillation: Secondary | ICD-10-CM

## 2023-04-14 NOTE — Telephone Encounter (Signed)
*  STAT* If patient is at the pharmacy, call can be transferred to refill team.   1. Which medications need to be refilled? (please list name of each medication and dose if known) need a written prescription for Eliquis  to [facility]   2. Would you like to learn more about the convenience, safety, & potential cost savings by using the [facility]?    3. Are you open to using the [facility] (Type [facility].    4. Which pharmacy/location (including street and city if local pharmacy) is medication to be sent to?   5. Do they need a 30 day or 90 day supply?

## 2023-04-14 NOTE — Telephone Encounter (Addendum)
 PAP: Patient assistance application for Eliquis through [employer] ([employer]) has been mailed to pt's home address on file. Provider portion of application will be faxed to provider's office.   I called the patient and left her a message about this   Edit: pt called and said she has been approved but they need a prescription sent to [facility]. I sent message on the other encounter

## 2023-04-14 NOTE — Telephone Encounter (Signed)
 Hi, this was routed to [place] but the patient has already been approved for assistance and she needs a prescription sent to [facility]. She said they told her they never received a prescription.

## 2023-04-15 MED ORDER — APIXABAN 2.5 MG PO TABS
2.5000 mg | ORAL_TABLET | Freq: Two times a day (BID) | ORAL | 1 refills | Status: DC
Start: 2023-04-15 — End: 2023-12-14

## 2023-04-15 NOTE — Addendum Note (Signed)
 Addended by: Roseanne Reno on: 04/15/2023 08:13 AM   Modules accepted: Orders

## 2023-04-15 NOTE — Telephone Encounter (Signed)
 Prescription faxed to [facility]- [facility].   Left message on home phone (DPR)

## 2023-04-18 NOTE — Telephone Encounter (Signed)
 [facility] [employer] called stating they never received the provider portion of the application, stated they did get the prescription.

## 2023-04-20 NOTE — Telephone Encounter (Signed)
 Provider portion of application faxed to [employer] ([employer]).

## 2023-04-21 ENCOUNTER — Ambulatory Visit (INDEPENDENT_AMBULATORY_CARE_PROVIDER_SITE_OTHER): Payer: Medicare Other

## 2023-04-21 DIAGNOSIS — I442 Atrioventricular block, complete: Secondary | ICD-10-CM | POA: Diagnosis not present

## 2023-04-23 LAB — CUP PACEART REMOTE DEVICE CHECK
Battery Remaining Longevity: 34 mo
Battery Voltage: 2.91 V
Brady Statistic AP VP Percent: 4.78 %
Brady Statistic AP VS Percent: 1.63 %
Brady Statistic AS VP Percent: 43.1 %
Brady Statistic AS VS Percent: 50.5 %
Brady Statistic RA Percent Paced: 6.32 %
Brady Statistic RV Percent Paced: 47.29 %
Date Time Interrogation Session: 20250306021713
Implantable Lead Connection Status: 753985
Implantable Lead Connection Status: 753985
Implantable Lead Implant Date: 20190528
Implantable Lead Implant Date: 20190528
Implantable Lead Location: 753859
Implantable Lead Location: 753860
Implantable Lead Model: 3830
Implantable Lead Model: 5076
Implantable Pulse Generator Implant Date: 20190528
Lead Channel Impedance Value: 342 Ohm
Lead Channel Impedance Value: 399 Ohm
Lead Channel Impedance Value: 437 Ohm
Lead Channel Impedance Value: 456 Ohm
Lead Channel Pacing Threshold Amplitude: 1 V
Lead Channel Pacing Threshold Amplitude: 1.375 V
Lead Channel Pacing Threshold Pulse Width: 0.4 ms
Lead Channel Pacing Threshold Pulse Width: 0.4 ms
Lead Channel Sensing Intrinsic Amplitude: 15.125 mV
Lead Channel Sensing Intrinsic Amplitude: 15.125 mV
Lead Channel Sensing Intrinsic Amplitude: 2.875 mV
Lead Channel Sensing Intrinsic Amplitude: 2.875 mV
Lead Channel Setting Pacing Amplitude: 2.5 V
Lead Channel Setting Pacing Amplitude: 4 V
Lead Channel Setting Pacing Pulse Width: 0.4 ms
Lead Channel Setting Sensing Sensitivity: 1.2 mV
Zone Setting Status: 755011
Zone Setting Status: 755011

## 2023-04-27 ENCOUNTER — Telehealth: Payer: Self-pay | Admitting: *Deleted

## 2023-04-27 NOTE — Telephone Encounter (Signed)
 Received a fax stating that pt was approved for patient assistance for Eliquis. Pt approved from 04/26/23 to 02/15/24.

## 2023-05-02 ENCOUNTER — Ambulatory Visit (HOSPITAL_COMMUNITY)
Admission: RE | Admit: 2023-05-02 | Discharge: 2023-05-02 | Disposition: A | Discharge status: Home / Self Care | Source: Ambulatory Visit | Attending: Surgery | Admitting: Surgery

## 2023-05-02 ENCOUNTER — Other Ambulatory Visit: Payer: Self-pay | Admitting: Surgery

## 2023-05-02 DIAGNOSIS — I7123 Aneurysm of the descending thoracic aorta, without rupture: Secondary | ICD-10-CM | POA: Diagnosis not present

## 2023-05-02 DIAGNOSIS — I7122 Aneurysm of the aortic arch, without rupture: Secondary | ICD-10-CM | POA: Diagnosis not present

## 2023-05-02 DIAGNOSIS — I714 Abdominal aortic aneurysm, without rupture, unspecified: Secondary | ICD-10-CM | POA: Diagnosis not present

## 2023-05-02 LAB — POCT I-STAT CREATININE: Creatinine, Ser: 2.2 mg/dL — ABNORMAL HIGH (ref 0.44–1.00)

## 2023-05-02 MED ORDER — IOHEXOL 350 MG/ML SOLN: 100.0000 mL | Freq: Once | INTRAVENOUS | Status: DC | PRN

## 2023-05-16 ENCOUNTER — Encounter: Payer: Self-pay | Admitting: Surgery

## 2023-05-16 ENCOUNTER — Ambulatory Visit: Payer: Medicare Other | Admitting: Surgery

## 2023-05-16 VITALS — BP 153/80 | HR 57 | Temp 97.8°F | Ht 62.0 in | Wt 96.0 lb

## 2023-05-16 DIAGNOSIS — I7143 Infrarenal abdominal aortic aneurysm, without rupture: Secondary | ICD-10-CM

## 2023-05-16 DIAGNOSIS — I7123 Aneurysm of the descending thoracic aorta, without rupture: Secondary | ICD-10-CM

## 2023-05-16 NOTE — Progress Notes (Signed)
 Vascular and Vein Specialist of [facility]  Patient name: Deborah Moses MRN: [id number] DOB: Jun 23, 1931 Sex: female   REASON FOR VISIT:    Follow up  HISOTRY OF PRESENT ILLNESS:    Deborah Moses is a 89 y.o. female who is status post endovascular repair of an abdominal aortic aneurysm with MAC 5.2 cm on 08/01/2019.  Her postoperative course located.  She also thoracic aneurysm.  Maximum thoracic aortic diameter is 5 cm.  She has not been able to obtain a CT scan with contrast due to renal issues, however her most recent CT scan suggested a new type B dissection.  She denies any chest pain or abdominal pain.  Following her last visit I referred her to nephrology for further evaluation of her progressive renal disease.   She has diminished ABI's in the 0.5 range.  She does have claudication symptoms when she walks outside however mostly she is inside and does not have calf cramping.    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  She is a non-smoker.  She is medically managed for hypertension.  She takes a statin for hypercholesterolemia.     PAST MEDICAL HISTORY:   Past Medical History:  Diagnosis Date   AAA (abdominal aortic aneurysm) (HCC)    Heart block AV third degree (HCC)    HTN (hypertension)    Hyperlipidemia    Vertigo      FAMILY HISTORY:   Family History  Problem Relation Age of Onset   Heart failure Father     SOCIAL HISTORY:   Social History   Tobacco Use   Smoking status: Never   Smokeless tobacco: Never  Substance Use Topics   Alcohol use: Never     ALLERGIES:   No Known Allergies   CURRENT MEDICATIONS:   Current Outpatient Medications  Medication Sig Dispense Refill   acetaminophen (TYLENOL) 500 MG tablet Take 500 mg by mouth every 6 (six) hours as needed for moderate pain.      alendronate (FOSAMAX) 70 MG tablet Take 70 mg by mouth once a week. Take with a full glass  of water on an empty stomach.     amLODipine (NORVASC) 5 MG tablet Take 5 mg by mouth daily.     apixaban (ELIQUIS) 2.5 MG TABS tablet Take 1 tablet (2.5 mg total) by mouth 2 (two) times daily. 180 tablet 1   calcium-vitamin D (OSCAL WITH D) 500-200 MG-UNIT TABS tablet Take 1 tablet by mouth daily.      famotidine (PEPCID) 20 MG tablet Take 20 mg by mouth daily.     lisinopril (PRINIVIL,ZESTRIL) 20 MG tablet Take 40 mg by mouth daily.      lovastatin (MEVACOR) 20 MG tablet Take 20 mg by mouth at bedtime.     metoprolol succinate (TOPROL-XL) 50 MG 24 hr tablet Take 1 tablet (50 mg total) by mouth daily. 90 tablet 0   olmesartan (BENICAR) 20 MG tablet Take 20 mg by mouth daily.     Omega-3 Fatty Acids (FISH OIL) 1000 MG CPDR Take 1,000 mg by mouth in the morning, at noon, and at bedtime.      Turmeric (QC TUMERIC COMPLEX PO) Take 5 mg by mouth daily. Tumeric-curcumin caps 06-998 mg     No current facility-administered medications for this visit.    REVIEW OF SYSTEMS:   [X]  denotes positive finding, [ ]  denotes negative finding Cardiac  Comments:  Chest pain or chest pressure:    Shortness  of breath upon exertion:    Short of breath when lying flat:    Irregular heart rhythm:        Vascular    Pain in calf, thigh, or hip brought on by ambulation:    Pain in feet at night that wakes you up from your sleep:     Blood clot in your veins:    Leg swelling:         Pulmonary    Oxygen at home:    Productive cough:     Wheezing:         Neurologic    Sudden weakness in arms or legs:     Sudden numbness in arms or legs:     Sudden onset of difficulty speaking or slurred speech:    Temporary loss of vision in one eye:     Problems with dizziness:         Gastrointestinal    Blood in stool:     Vomited blood:         Genitourinary    Burning when urinating:     Blood in urine:        Psychiatric    Major depression:         Hematologic    Bleeding problems:    Problems with  blood clotting too easily:        Skin    Rashes or ulcers:        Constitutional    Fever or chills:      PHYSICAL EXAM:   Vitals:   05/16/23 0833  BP: (!) 153/80  Pulse: (!) 57  Temp: 97.8 F (36.6 C)  SpO2: 97%  Weight: 96 lb (43.5 kg)  Height: 5\' 2"  (1.575 m)    GENERAL: The patient is a well-nourished female, in no acute distress. The vital signs are documented above. CARDIAC: There is a regular rate and rhythm.  PULMONARY: Non-labored respirations ABDOMEN: Soft and non-tender.  MUSCULOSKELETAL: There are no major deformities or cyanosis. NEUROLOGIC: No focal weakness or paresthesias are detected. SKIN: There are no ulcers or rashes noted. PSYCHIATRIC: The patient has a normal affect.  STUDIES:   I have reviewed her CT scan from 2 weeks ago which has not yet been read by radiology.  There does appear to be a slight increase in the size of her thoracic aneurysm, now measuring 5.3 cm.  MEDICAL ISSUES:   TAAA: There does appear to be a slight increase in the size of her thoracic aneurysm.  There is concern over possible type B dissection as well.  I told her that I cannot make surgical planning based off of these images without contrast.  Giving her contrast with potentially affect her kidneys.  Based off the current size we have elected to monitor this.  If there is a significant increase in the size on her next scan I may need to get a contrasted CT for operative planning understanding that this may cause her to go onto dialysis.  At this time, we will continue with close observation with a CT scan in 6 months  AAA: There does not appear to be any significant change to her abdominal aneurysm  Durene Cal, IV, MD, FACS Vascular and Vein Specialists of [facility] [phone] Pager [phone]

## 2023-05-17 ENCOUNTER — Telehealth: Payer: Self-pay

## 2023-05-17 NOTE — Telephone Encounter (Signed)
 Results: -received message pt stating she had an office visit yesterday and wanted to the results from her CT but they were not in before her appt. -reviewed office note with MD interpretation during visit.  Results posted in mychart as well. -returned call x 3 busy signal

## 2023-05-27 NOTE — Progress Notes (Signed)
 Remote pacemaker transmission.

## 2023-05-27 NOTE — Addendum Note (Signed)
 Addended by: Elease Etienne A on: 05/27/2023 11:52 AM   Modules accepted: Orders

## 2023-06-27 DIAGNOSIS — I700 Atherosclerosis of aorta: Secondary | ICD-10-CM | POA: Diagnosis not present

## 2023-06-27 DIAGNOSIS — M1710 Unilateral primary osteoarthritis, unspecified knee: Secondary | ICD-10-CM | POA: Diagnosis not present

## 2023-06-27 DIAGNOSIS — N184 Chronic kidney disease, stage 4 (severe): Secondary | ICD-10-CM | POA: Diagnosis not present

## 2023-06-27 DIAGNOSIS — Z0000 Encounter for general adult medical examination without abnormal findings: Secondary | ICD-10-CM | POA: Diagnosis not present

## 2023-06-27 DIAGNOSIS — E213 Hyperparathyroidism, unspecified: Secondary | ICD-10-CM | POA: Diagnosis not present

## 2023-06-27 DIAGNOSIS — R630 Anorexia: Secondary | ICD-10-CM | POA: Diagnosis not present

## 2023-06-27 DIAGNOSIS — I10 Essential (primary) hypertension: Secondary | ICD-10-CM | POA: Diagnosis not present

## 2023-06-27 DIAGNOSIS — E7849 Other hyperlipidemia: Secondary | ICD-10-CM | POA: Diagnosis not present

## 2023-07-04 DIAGNOSIS — M810 Age-related osteoporosis without current pathological fracture: Secondary | ICD-10-CM | POA: Diagnosis not present

## 2023-07-04 DIAGNOSIS — D6869 Other thrombophilia: Secondary | ICD-10-CM | POA: Diagnosis not present

## 2023-07-21 ENCOUNTER — Ambulatory Visit: Payer: Self-pay | Admitting: Internal Medicine

## 2023-07-21 ENCOUNTER — Ambulatory Visit (INDEPENDENT_AMBULATORY_CARE_PROVIDER_SITE_OTHER): Payer: Medicare Other

## 2023-07-21 DIAGNOSIS — I442 Atrioventricular block, complete: Secondary | ICD-10-CM

## 2023-07-21 LAB — CUP PACEART REMOTE DEVICE CHECK
Battery Remaining Longevity: 34 mo
Battery Voltage: 2.91 V
Brady Statistic AP VP Percent: 2.57 %
Brady Statistic AP VS Percent: 0.71 %
Brady Statistic AS VP Percent: 50.12 %
Brady Statistic AS VS Percent: 46.6 %
Brady Statistic RA Percent Paced: 3.27 %
Brady Statistic RV Percent Paced: 52.66 %
Date Time Interrogation Session: 20250605031936
Implantable Lead Connection Status: 753985
Implantable Lead Connection Status: 753985
Implantable Lead Implant Date: 20190528
Implantable Lead Implant Date: 20190528
Implantable Lead Location: 753859
Implantable Lead Location: 753860
Implantable Lead Model: 3830
Implantable Lead Model: 5076
Implantable Pulse Generator Implant Date: 20190528
Lead Channel Impedance Value: 361 Ohm
Lead Channel Impedance Value: 361 Ohm
Lead Channel Impedance Value: 418 Ohm
Lead Channel Impedance Value: 475 Ohm
Lead Channel Pacing Threshold Amplitude: 1 V
Lead Channel Pacing Threshold Amplitude: 1.375 V
Lead Channel Pacing Threshold Pulse Width: 0.4 ms
Lead Channel Pacing Threshold Pulse Width: 0.4 ms
Lead Channel Sensing Intrinsic Amplitude: 14.25 mV
Lead Channel Sensing Intrinsic Amplitude: 14.25 mV
Lead Channel Sensing Intrinsic Amplitude: 2.375 mV
Lead Channel Sensing Intrinsic Amplitude: 2.375 mV
Lead Channel Setting Pacing Amplitude: 2.5 V
Lead Channel Setting Pacing Amplitude: 4 V
Lead Channel Setting Pacing Pulse Width: 0.4 ms
Lead Channel Setting Sensing Sensitivity: 1.2 mV
Zone Setting Status: 755011
Zone Setting Status: 755011

## 2023-09-07 NOTE — Progress Notes (Signed)
 Remote pacemaker transmission.

## 2023-09-20 DIAGNOSIS — N1832 Chronic kidney disease, stage 3b: Secondary | ICD-10-CM | POA: Diagnosis not present

## 2023-09-21 ENCOUNTER — Other Ambulatory Visit: Payer: Self-pay

## 2023-09-21 DIAGNOSIS — I7143 Infrarenal abdominal aortic aneurysm, without rupture: Secondary | ICD-10-CM

## 2023-09-26 DIAGNOSIS — D509 Iron deficiency anemia, unspecified: Secondary | ICD-10-CM | POA: Diagnosis not present

## 2023-09-26 DIAGNOSIS — N1832 Chronic kidney disease, stage 3b: Secondary | ICD-10-CM | POA: Diagnosis not present

## 2023-09-26 DIAGNOSIS — I129 Hypertensive chronic kidney disease with stage 1 through stage 4 chronic kidney disease, or unspecified chronic kidney disease: Secondary | ICD-10-CM | POA: Diagnosis not present

## 2023-09-26 DIAGNOSIS — E785 Hyperlipidemia, unspecified: Secondary | ICD-10-CM | POA: Diagnosis not present

## 2023-09-29 NOTE — Addendum Note (Signed)
 Addended by: RAYNA MOATS A on: 09/29/2023 07:39 AM   Modules accepted: Orders

## 2023-10-03 DIAGNOSIS — H35032 Hypertensive retinopathy, left eye: Secondary | ICD-10-CM | POA: Diagnosis not present

## 2023-10-10 DIAGNOSIS — N184 Chronic kidney disease, stage 4 (severe): Secondary | ICD-10-CM | POA: Diagnosis not present

## 2023-10-10 DIAGNOSIS — E7849 Other hyperlipidemia: Secondary | ICD-10-CM | POA: Diagnosis not present

## 2023-10-10 DIAGNOSIS — I4819 Other persistent atrial fibrillation: Secondary | ICD-10-CM | POA: Diagnosis not present

## 2023-10-10 DIAGNOSIS — I10 Essential (primary) hypertension: Secondary | ICD-10-CM | POA: Diagnosis not present

## 2023-10-10 DIAGNOSIS — R630 Anorexia: Secondary | ICD-10-CM | POA: Diagnosis not present

## 2023-10-19 DIAGNOSIS — I10 Essential (primary) hypertension: Secondary | ICD-10-CM | POA: Diagnosis not present

## 2023-10-19 DIAGNOSIS — S60052A Contusion of left little finger without damage to nail, initial encounter: Secondary | ICD-10-CM | POA: Diagnosis not present

## 2023-10-19 DIAGNOSIS — I4891 Unspecified atrial fibrillation: Secondary | ICD-10-CM | POA: Diagnosis not present

## 2023-10-19 DIAGNOSIS — I672 Cerebral atherosclerosis: Secondary | ICD-10-CM | POA: Diagnosis not present

## 2023-10-19 DIAGNOSIS — E785 Hyperlipidemia, unspecified: Secondary | ICD-10-CM | POA: Diagnosis not present

## 2023-10-19 DIAGNOSIS — M1812 Unilateral primary osteoarthritis of first carpometacarpal joint, left hand: Secondary | ICD-10-CM | POA: Diagnosis not present

## 2023-10-19 DIAGNOSIS — R519 Headache, unspecified: Secondary | ICD-10-CM | POA: Diagnosis not present

## 2023-10-19 DIAGNOSIS — S6992XA Unspecified injury of left wrist, hand and finger(s), initial encounter: Secondary | ICD-10-CM | POA: Diagnosis not present

## 2023-10-19 DIAGNOSIS — S01412A Laceration without foreign body of left cheek and temporomandibular area, initial encounter: Secondary | ICD-10-CM | POA: Diagnosis not present

## 2023-10-19 DIAGNOSIS — Z7901 Long term (current) use of anticoagulants: Secondary | ICD-10-CM | POA: Diagnosis not present

## 2023-10-19 DIAGNOSIS — S0990XA Unspecified injury of head, initial encounter: Secondary | ICD-10-CM | POA: Diagnosis not present

## 2023-10-19 DIAGNOSIS — W010XXA Fall on same level from slipping, tripping and stumbling without subsequent striking against object, initial encounter: Secondary | ICD-10-CM | POA: Diagnosis not present

## 2023-10-19 DIAGNOSIS — S0083XA Contusion of other part of head, initial encounter: Secondary | ICD-10-CM | POA: Diagnosis not present

## 2023-10-19 DIAGNOSIS — S0181XA Laceration without foreign body of other part of head, initial encounter: Secondary | ICD-10-CM | POA: Diagnosis not present

## 2023-10-19 DIAGNOSIS — S60042A Contusion of left ring finger without damage to nail, initial encounter: Secondary | ICD-10-CM | POA: Diagnosis not present

## 2023-10-20 ENCOUNTER — Ambulatory Visit (INDEPENDENT_AMBULATORY_CARE_PROVIDER_SITE_OTHER): Payer: Medicare Other

## 2023-10-20 DIAGNOSIS — I4891 Unspecified atrial fibrillation: Secondary | ICD-10-CM | POA: Diagnosis not present

## 2023-10-21 LAB — CUP PACEART REMOTE DEVICE CHECK
Battery Remaining Longevity: 30 mo
Battery Voltage: 2.9 V
Brady Statistic AP VP Percent: 2.33 %
Brady Statistic AP VS Percent: 0.67 %
Brady Statistic AS VP Percent: 34.25 %
Brady Statistic AS VS Percent: 62.75 %
Brady Statistic RA Percent Paced: 2.98 %
Brady Statistic RV Percent Paced: 36.58 %
Date Time Interrogation Session: 20250904031924
Implantable Lead Connection Status: 753985
Implantable Lead Connection Status: 753985
Implantable Lead Implant Date: 20190528
Implantable Lead Implant Date: 20190528
Implantable Lead Location: 753859
Implantable Lead Location: 753860
Implantable Lead Model: 3830
Implantable Lead Model: 5076
Implantable Pulse Generator Implant Date: 20190528
Lead Channel Impedance Value: 285 Ohm
Lead Channel Impedance Value: 323 Ohm
Lead Channel Impedance Value: 342 Ohm
Lead Channel Impedance Value: 437 Ohm
Lead Channel Pacing Threshold Amplitude: 1 V
Lead Channel Pacing Threshold Amplitude: 1.375 V
Lead Channel Pacing Threshold Pulse Width: 0.4 ms
Lead Channel Pacing Threshold Pulse Width: 0.4 ms
Lead Channel Sensing Intrinsic Amplitude: 13.375 mV
Lead Channel Sensing Intrinsic Amplitude: 13.375 mV
Lead Channel Sensing Intrinsic Amplitude: 2.125 mV
Lead Channel Sensing Intrinsic Amplitude: 2.125 mV
Lead Channel Setting Pacing Amplitude: 2.5 V
Lead Channel Setting Pacing Amplitude: 4 V
Lead Channel Setting Pacing Pulse Width: 0.4 ms
Lead Channel Setting Sensing Sensitivity: 1.2 mV
Zone Setting Status: 755011
Zone Setting Status: 755011

## 2023-10-23 ENCOUNTER — Ambulatory Visit: Payer: Self-pay | Admitting: Internal Medicine

## 2023-10-24 DIAGNOSIS — Z681 Body mass index (BMI) 19 or less, adult: Secondary | ICD-10-CM | POA: Diagnosis not present

## 2023-10-24 DIAGNOSIS — S0182XD Laceration with foreign body of other part of head, subsequent encounter: Secondary | ICD-10-CM | POA: Diagnosis not present

## 2023-10-24 DIAGNOSIS — Z1211 Encounter for screening for malignant neoplasm of colon: Secondary | ICD-10-CM | POA: Diagnosis not present

## 2023-10-29 NOTE — Progress Notes (Signed)
 Remote PPM Transmission

## 2023-10-31 ENCOUNTER — Encounter (INDEPENDENT_AMBULATORY_CARE_PROVIDER_SITE_OTHER): Payer: Self-pay | Admitting: *Deleted

## 2023-11-09 ENCOUNTER — Ambulatory Visit (HOSPITAL_COMMUNITY)
Admission: RE | Admit: 2023-11-09 | Discharge: 2023-11-09 | Disposition: A | Discharge status: Home / Self Care | Source: Ambulatory Visit | Attending: Surgery | Admitting: Surgery

## 2023-11-09 DIAGNOSIS — K5730 Diverticulosis of large intestine without perforation or abscess without bleeding: Secondary | ICD-10-CM | POA: Diagnosis not present

## 2023-11-09 DIAGNOSIS — I7143 Infrarenal abdominal aortic aneurysm, without rupture: Secondary | ICD-10-CM | POA: Insufficient documentation

## 2023-11-09 DIAGNOSIS — I7123 Aneurysm of the descending thoracic aorta, without rupture: Secondary | ICD-10-CM | POA: Diagnosis not present

## 2023-11-14 ENCOUNTER — Encounter: Payer: Self-pay | Admitting: Surgery

## 2023-11-14 ENCOUNTER — Ambulatory Visit: Attending: Surgery | Admitting: Surgery

## 2023-11-14 VITALS — BP 157/77 | HR 66 | Temp 97.7°F | Ht 62.0 in | Wt 93.8 lb

## 2023-11-14 DIAGNOSIS — I7143 Infrarenal abdominal aortic aneurysm, without rupture: Secondary | ICD-10-CM

## 2023-11-14 NOTE — Progress Notes (Signed)
 Vascular and Vein Specialist of [facility]  Patient name: Deborah Moses MRN: [id number] DOB: 05-09-1931 Sex: female   REASON FOR VISIT:    Follow up  HISOTRY OF PRESENT ILLNESS:    Deborah Moses is a 89 y.o. female who is status post endovascular repair of a 5.2 cm abdominal aortic aneurysm  on 08/01/2019.  Her postoperative course was uncomplicated.  She also has a known thoracic aneurysm.  Maximum thoracic aortic diameter is 5.9 cm.  She has not been able to obtain a CT scan with contrast due to renal issues, however her  CT scan suggests a type B dissection.  She denies any chest pain or abdominal pain.    She has diminished ABI's in the 0.5 range.  She does have claudication symptoms when she walks outside however mostly she is inside and does not have calf cramping.  She was in the ER recently for a fall while in the backyard.  She did not have any major injuries.  She is also being worked up for anemia.  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  She is a non-smoker.  She is medically managed for hypertension.  She takes a statin for hypercholesterolemia.  She has renal insufficiency.  Her most recent creatinine is 2.2.   PAST MEDICAL HISTORY:   Past Medical History:  Diagnosis Date   AAA (abdominal aortic aneurysm)    Heart block AV third degree (HCC)    HTN (hypertension)    Hyperlipidemia    Vertigo      FAMILY HISTORY:   Family History  Problem Relation Age of Onset   Heart failure Father     SOCIAL HISTORY:   Social History   Tobacco Use   Smoking status: Never   Smokeless tobacco: Never  Substance Use Topics   Alcohol use: Never     ALLERGIES:   No Known Allergies   CURRENT MEDICATIONS:   Current Outpatient Medications  Medication Sig Dispense Refill   acetaminophen  (TYLENOL ) 500 MG tablet Take 500 mg by mouth every 6 (six) hours as  needed for moderate pain.      alendronate (FOSAMAX) 70 MG tablet Take 70 mg by mouth once a week. Take with a full glass of water  on an empty stomach.     amLODipine  (NORVASC ) 5 MG tablet Take 5 mg by mouth daily.     apixaban  (ELIQUIS ) 2.5 MG TABS tablet Take 1 tablet (2.5 mg total) by mouth 2 (two) times daily. 180 tablet 1   calcium -vitamin D  (OSCAL WITH D) 500-200 MG-UNIT TABS tablet Take 1 tablet by mouth daily.      famotidine (PEPCID) 20 MG tablet Take 20 mg by mouth daily.     lisinopril  (PRINIVIL ,ZESTRIL ) 20 MG tablet Take 40 mg by mouth daily.      lovastatin (MEVACOR) 20 MG tablet Take 20 mg by mouth at bedtime.     metoprolol  succinate (TOPROL -XL) 50 MG 24 hr tablet Take 1 tablet (50 mg total) by mouth daily. 90 tablet 0   olmesartan (BENICAR) 20 MG tablet Take 20 mg by mouth daily.     Omega-3 Fatty Acids (FISH OIL) 1000 MG CPDR Take 1,000 mg by mouth in the morning, at noon, and at bedtime.      Turmeric (QC TUMERIC COMPLEX PO) Take 5 mg by mouth daily. Tumeric-curcumin caps 06-998 mg     No current facility-administered medications for this visit.  REVIEW OF SYSTEMS:   [X]  denotes positive finding, [ ]  denotes negative finding Cardiac  Comments:  Chest pain or chest pressure:    Shortness of breath upon exertion:    Short of breath when lying flat:    Irregular heart rhythm:        Vascular    Pain in calf, thigh, or hip brought on by ambulation:    Pain in feet at night that wakes you up from your sleep:     Blood clot in your veins:    Leg swelling:         Pulmonary    Oxygen at home:    Productive cough:     Wheezing:         Neurologic    Sudden weakness in arms or legs:     Sudden numbness in arms or legs:     Sudden onset of difficulty speaking or slurred speech:    Temporary loss of vision in one eye:     Problems with dizziness:         Gastrointestinal    Blood in stool:     Vomited blood:         Genitourinary    Burning when urinating:      Blood in urine:        Psychiatric    Major depression:         Hematologic    Bleeding problems:    Problems with blood clotting too easily:        Skin    Rashes or ulcers:        Constitutional    Fever or chills:      PHYSICAL EXAM:   Vitals:   11/14/23 0817  BP: (!) 157/77  Pulse: 66  Temp: 97.7 F (36.5 C)  SpO2: 97%  Weight: 93 lb 12.8 oz (42.5 kg)  Height: 5' 2 (1.575 m)    GENERAL: The patient is a well-nourished female, in no acute distress. The vital signs are documented above. CARDIAC: There is a regular rate and rhythm.  PULMONARY: Non-labored respirations ABDOMEN: Soft and non-tender  MUSCULOSKELETAL: There are no major deformities or cyanosis. NEUROLOGIC: No focal weakness or paresthesias are detected. SKIN: There are no ulcers or rashes noted. PSYCHIATRIC: The patient has a normal affect.  STUDIES:   I have reviewed the following non-contrast CT:  1. Status post endograft repair of the abdominal aortic aneurysm with interval increase in excluded aneurysm sac size to 67 x 59 mm, suggestive of endoleak; definitive evaluation for endoleak is not possible without IV contrast. 2. Aneurysmal dilation of presumed aortic dissection beginning distal to the left subclavian artery with unchanged dimensions: aortic arch 59 x 50 mm; proximal descending aorta 43 mm; distal descending thoracic aorta 39 mm. 3. Small left pleural effusion. Streaky linear opacities in the left lower lobe may reflect atelectasis; pneumonia is not excluded.   MEDICAL ISSUES:   AAA: 68-month ago her aneurysm measured 6.5 cm.  Today it measures 6.7 cm.  Without contrast, I cannot evaluate for endoleak.  TAAA: Today maximum diameter is 5.9 cm which is unchanged  I considered sending her to interventional radiology for consideration of angiography to evaluate for endoleak.  Her CT scan the last time we use contrast in 2023 suggested a type II endoleak.  However at this time, she is  dealing with anemia.  She tells me her hemoglobin is fairly low and she needs to have a colonoscopy.  Therefore we decided to reevaluate her in 6 months with a repeat CT scan.  She understands that she is at risk for aneurysm rupture and that this would lead to death  Malvina Serene CLORE, MD, FACS Vascular and Vein Specialists of [facility] [phone] Pager [phone]

## 2023-11-17 ENCOUNTER — Encounter: Payer: Self-pay | Admitting: Internal Medicine

## 2023-11-17 ENCOUNTER — Ambulatory Visit: Attending: Internal Medicine | Admitting: Internal Medicine

## 2023-11-17 VITALS — BP 152/80 | HR 71 | Ht 62.0 in | Wt 98.0 lb

## 2023-11-17 DIAGNOSIS — I4891 Unspecified atrial fibrillation: Secondary | ICD-10-CM

## 2023-11-17 DIAGNOSIS — I442 Atrioventricular block, complete: Secondary | ICD-10-CM

## 2023-11-17 LAB — CUP PACEART INCLINIC DEVICE CHECK
Battery Remaining Longevity: 33 mo
Battery Voltage: 2.9 V
Brady Statistic AP VP Percent: 6.89 %
Brady Statistic AP VS Percent: 0.93 %
Brady Statistic AS VP Percent: 48.04 %
Brady Statistic AS VS Percent: 44.14 %
Brady Statistic RA Percent Paced: 7.68 %
Brady Statistic RV Percent Paced: 54.22 %
Date Time Interrogation Session: 20251002150321
Implantable Lead Connection Status: 753985
Implantable Lead Connection Status: 753985
Implantable Lead Implant Date: 20190528
Implantable Lead Implant Date: 20190528
Implantable Lead Location: 753859
Implantable Lead Location: 753860
Implantable Lead Model: 3830
Implantable Lead Model: 5076
Implantable Pulse Generator Implant Date: 20190528
Lead Channel Impedance Value: 342 Ohm
Lead Channel Impedance Value: 361 Ohm
Lead Channel Impedance Value: 418 Ohm
Lead Channel Impedance Value: 475 Ohm
Lead Channel Pacing Threshold Amplitude: 1 V
Lead Channel Pacing Threshold Amplitude: 1.375 V
Lead Channel Pacing Threshold Amplitude: 2.25 V
Lead Channel Pacing Threshold Pulse Width: 0.4 ms
Lead Channel Pacing Threshold Pulse Width: 0.4 ms
Lead Channel Pacing Threshold Pulse Width: 0.8 ms
Lead Channel Sensing Intrinsic Amplitude: 1.75 mV
Lead Channel Sensing Intrinsic Amplitude: 1.875 mV
Lead Channel Sensing Intrinsic Amplitude: 12.125 mV
Lead Channel Sensing Intrinsic Amplitude: 14.5 mV
Lead Channel Setting Pacing Amplitude: 2.5 V
Lead Channel Setting Pacing Amplitude: 4 V
Lead Channel Setting Pacing Pulse Width: 0.4 ms
Lead Channel Setting Sensing Sensitivity: 1.2 mV
Zone Setting Status: 755011
Zone Setting Status: 755011

## 2023-11-17 NOTE — Patient Instructions (Signed)

## 2023-11-17 NOTE — Progress Notes (Signed)
 HPI Deborah Moses returns today for followup. She is a pleasant 89 yo woman with CHB, s/p PPM insertion, peripheral vascular disease and AAA. She has had worsening in her AAA and underwent endovascular repair. She is followed by Dr. Serene. She is limited by claudication. She denies chest pain or sob. No edema. She has some elevated pacing threshold in the atrium. no chest pain or sob . Her bp is better at home. She is still about 3 years to ERI. She has had one fall but no broken bones.  No Known Allergies   Current Outpatient Medications  Medication Sig Dispense Refill   acetaminophen  (TYLENOL ) 500 MG tablet Take 500 mg by mouth every 6 (six) hours as needed for moderate pain.      alendronate (FOSAMAX) 70 MG tablet Take 70 mg by mouth once a week. Take with a full glass of water  on an empty stomach.     amLODipine  (NORVASC ) 5 MG tablet Take 5 mg by mouth daily.     apixaban  (ELIQUIS ) 2.5 MG TABS tablet Take 1 tablet (2.5 mg total) by mouth 2 (two) times daily. 180 tablet 1   calcium -vitamin D  (OSCAL WITH D) 500-200 MG-UNIT TABS tablet Take 1 tablet by mouth daily.      famotidine (PEPCID) 20 MG tablet Take 20 mg by mouth daily.     Ferrous Sulfate (IRON PO) Take by mouth.     lisinopril  (PRINIVIL ,ZESTRIL ) 20 MG tablet Take 40 mg by mouth daily.      lovastatin (MEVACOR) 20 MG tablet Take 20 mg by mouth at bedtime.     metoprolol  succinate (TOPROL -XL) 50 MG 24 hr tablet Take 1 tablet (50 mg total) by mouth daily. 90 tablet 0   olmesartan (BENICAR) 20 MG tablet Take 20 mg by mouth daily.     Omega-3 Fatty Acids (FISH OIL) 1000 MG CPDR Take 1,000 mg by mouth in the morning, at noon, and at bedtime.      Turmeric (QC TUMERIC COMPLEX PO) Take 5 mg by mouth daily. Tumeric-curcumin caps 06-998 mg     No current facility-administered medications for this visit.     Past Medical History:  Diagnosis Date   AAA (abdominal aortic aneurysm)    Heart block AV third degree (HCC)    HTN  (hypertension)    Hyperlipidemia    Vertigo     ROS:   All systems reviewed and negative except as noted in the HPI.   Past Surgical History:  Procedure Laterality Date   ABDOMINAL AORTIC ENDOVASCULAR STENT GRAFT N/A 08/01/2019   Procedure: ABDOMINAL AORTIC ENDOVASCULAR STENT GRAFT;  Surgeon: Serene Gaile ORN, MD;  Location: [facility];  Service: Vascular;  Laterality: N/A;   APPENDECTOMY     per pt she might've gotten her appendix and gallbladder out together because that was standard procedure back then   CATARACT EXTRACTION W/PHACO Left 02/22/2022   Procedure: CATARACT EXTRACTION PHACO AND INTRAOCULAR LENS PLACEMENT (IOC);  Surgeon: Harrie Agent, MD;  Location: [facility];  Service: Ophthalmology;  Laterality: Left;  CDE: 25.37   CATARACT EXTRACTION W/PHACO Right 06/07/2022   Procedure: CATARACT EXTRACTION PHACO AND INTRAOCULAR LENS PLACEMENT (IOC);  Surgeon: Harrie Agent, MD;  Location: [facility];  Service: Ophthalmology;  Laterality: Right;  CDE: 22.88   CHOLECYSTECTOMY     PACEMAKER IMPLANT N/A 07/12/2017   Procedure: PACEMAKER IMPLANT;  Surgeon: Waddell Danelle ORN, MD;  Location: [facility];  Service: Cardiovascular;  Laterality: N/A;  TONSILLECTOMY     ULTRASOUND GUIDANCE FOR VASCULAR ACCESS N/A 08/01/2019   Procedure: ULTRASOUND GUIDANCE FOR VASCULAR ACCESS;  Surgeon: Serene Gaile ORN, MD;  Location: [facility] OR;  Service: Vascular;  Laterality: N/A;     Family History  Problem Relation Age of Onset   Heart failure Father      Social History   Socioeconomic History   Marital status: Widowed    Spouse name: Not on file   Number of children: 4   Years of education: Not on file   Highest education level: Not on file  Occupational History   Not on file  Tobacco Use   Smoking status: Never   Smokeless tobacco: Never  Vaping Use   Vaping status: Never Used  Substance and Sexual Activity   Alcohol use: Never   Drug use: Never   Sexual activity: Not on file  Other Topics  Concern   Not on file  Social History Narrative   Not on file   Social Drivers of Health   Financial Resource Strain: Not on file  Food Insecurity: Not on file  Transportation Needs: Not on file  Physical Activity: Not on file  Stress: Not on file  Social Connections: Not on file  Intimate Partner Violence: Not on file     BP (!) 152/80   Pulse 71   Ht 5' 2 (1.575 m)   Wt 98 lb (44.5 kg)   SpO2 97%   BMI 17.92 kg/m   Physical Exam:  Well appearing NAD HEENT: Unremarkable Neck:  No JVD, no thyromegally Lymphatics:  No adenopathy Back:  No CVA tenderness Lungs:  Clear HEART:  Regular rate rhythm, no murmurs, no rubs, no clicks Abd:  soft, positive bowel sounds, no organomegally, no rebound, no guarding Ext:  2 plus pulses, no edema, no cyanosis, no clubbing Skin:  No rashes no nodules Neuro:  CN II through XII intact, motor grossly intact  EKG - NSR with P synch ventricular pacing  DEVICE  Normal device function.  See PaceArt for details.   Assess/Plan: CHB - she is doing well s/p PPM insertion.  AAA - she will follow up with Dr. Serene.  PPM - her [employer] DDD PM is working normally. She is about 3 years to ERI.   Danelle Marcellous Snarski,MD

## 2023-11-21 ENCOUNTER — Ambulatory Visit (INDEPENDENT_AMBULATORY_CARE_PROVIDER_SITE_OTHER): Admitting: Gastroenterology

## 2023-11-21 ENCOUNTER — Encounter (INDEPENDENT_AMBULATORY_CARE_PROVIDER_SITE_OTHER): Payer: Self-pay | Admitting: Gastroenterology

## 2023-11-21 VITALS — BP 166/75 | HR 64 | Temp 97.3°F | Ht 62.0 in | Wt 102.0 lb

## 2023-11-21 DIAGNOSIS — R197 Diarrhea, unspecified: Secondary | ICD-10-CM | POA: Insufficient documentation

## 2023-11-21 DIAGNOSIS — D509 Iron deficiency anemia, unspecified: Secondary | ICD-10-CM | POA: Insufficient documentation

## 2023-11-21 DIAGNOSIS — D500 Iron deficiency anemia secondary to blood loss (chronic): Secondary | ICD-10-CM

## 2023-11-21 NOTE — Progress Notes (Signed)
 Deborah Moses, M.D. Gastroenterology & Hepatology [facility] [address] [place], [place] [address] Primary Care Physician: Orpha Yancey LABOR, MD [address] [place] [address]  Referring MD: PCP  Chief Complaint: iron deficiency anemia and Diarrhea  History of Present Illness: Deborah Moses is a 89 y.o. female with past medical history of hypertension, third-degree AV block status post pacemaker placement, AAA status post endovascular repair, thoracic aneurysm, atrial fibrillation, hyperlipidemia, vertigo, CKD, who presents for evaluation of anemia and diarrhea.  Daughter is present during the appointment, helps providing information regarding the patient's complaints.  Patient reports she has very occasional episode of diarrhea - one episode every 3-4 months, which are isolated episodes. May take Imodium as needed when she goes out and has had diarrhea.  The patient was mainly referred to our office as she was found to have worsening anemia and fecal occult blood in stool.  Patient was seen by PCP who performed blood workup on 10/10/2023 showing a hemoglobin of 9.8, MCV 97, WBC 6.6 and platelets 343. Had positive Occult blood In stools on 10/21/23.She has been taking iron PO for the last 3 months as prescribed by PCP. States that 3 weeks ago she was advised to increase her iron to two pills per day. Started seeing black stools when she started taking iron, not prior to this. Denies any hematochezia.   The patient denies having any nausea, vomiting, fever, chills, hematochezia, melena, hematemesis, abdominal distention, abdominal pain, jaundice, pruritus or recent weight loss.   Notably, the patient has a significant aortic aneurysm history for which she follows with Dr. Serene at [facility].  Underwent CT chest/abdomen/pelvis without contrast on 11/09/23 showing enlargement of the abdominal aneurysm up to 6.7 cm (questionable leak ?),  thoracic aortic dilation with presumed dissection with max size of 59 mm.  She was last seen in his office on 11/14/2023 -as the CT scan was performed without contrast, adequate evaluation for potential leak was limited.  It was recommended that she had to undergo repeat CT in 6 months.  Last ZHI:wzczm Last Colonoscopy:never  FHx: neg for any gastrointestinal/liver disease, no malignancies Social: neg smoking, alcohol or illicit drug use Surgical: cholecystectomy, stent in AAA  Past Medical History: Past Medical History:  Diagnosis Date   AAA (abdominal aortic aneurysm)    Heart block AV third degree (HCC)    HTN (hypertension)    Hyperlipidemia    Vertigo     Past Surgical History: Past Surgical History:  Procedure Laterality Date   ABDOMINAL AORTIC ENDOVASCULAR STENT GRAFT N/A 08/01/2019   Procedure: ABDOMINAL AORTIC ENDOVASCULAR STENT GRAFT;  Surgeon: Serene Gaile ORN, MD;  Location: [facility];  Service: Vascular;  Laterality: N/A;   APPENDECTOMY     per pt she might've gotten her appendix and gallbladder out together because that was standard procedure back then   CATARACT EXTRACTION W/PHACO Left 02/22/2022   Procedure: CATARACT EXTRACTION PHACO AND INTRAOCULAR LENS PLACEMENT (IOC);  Surgeon: Harrie Agent, MD;  Location: [facility];  Service: Ophthalmology;  Laterality: Left;  CDE: 25.37   CATARACT EXTRACTION W/PHACO Right 06/07/2022   Procedure: CATARACT EXTRACTION PHACO AND INTRAOCULAR LENS PLACEMENT (IOC);  Surgeon: Harrie Agent, MD;  Location: [facility];  Service: Ophthalmology;  Laterality: Right;  CDE: 22.88   CHOLECYSTECTOMY     PACEMAKER IMPLANT N/A 07/12/2017   Procedure: PACEMAKER IMPLANT;  Surgeon: Waddell Danelle ORN, MD;  [facility];  Service: Cardiovascular;  Laterality: N/A;  TONSILLECTOMY     ULTRASOUND GUIDANCE FOR VASCULAR ACCESS N/A 08/01/2019   Procedure: ULTRASOUND GUIDANCE FOR VASCULAR ACCESS;  Surgeon: Serene Gaile ORN, MD;  Location: [facility];  Service:  Vascular;  Laterality: N/A;    Family History: Family History  Problem Relation Age of Onset   Heart failure Father     Social History: Social History   Tobacco Use  Smoking Status Never  Smokeless Tobacco Never   Social History   Substance and Sexual Activity  Alcohol Use Never   Social History   Substance and Sexual Activity  Drug Use Never    Allergies: No Known Allergies  Medications: Current Outpatient Medications  Medication Sig Dispense Refill   acetaminophen  (TYLENOL ) 500 MG tablet Take 500 mg by mouth every 6 (six) hours as needed for moderate pain.      alendronate (FOSAMAX) 70 MG tablet Take 70 mg by mouth once a week. Take with a full glass of water  on an empty stomach.     amLODipine  (NORVASC ) 5 MG tablet Take 5 mg by mouth daily.     apixaban  (ELIQUIS ) 2.5 MG TABS tablet Take 1 tablet (2.5 mg total) by mouth 2 (two) times daily. 180 tablet 1   calcium -vitamin D  (OSCAL WITH D) 500-200 MG-UNIT TABS tablet Take 1 tablet by mouth daily.      famotidine (PEPCID) 20 MG tablet Take 20 mg by mouth daily. (Patient taking differently: Take 20 mg by mouth as needed.)     Ferrous Sulfate (IRON PO) Take by mouth. (Patient taking differently: Take by mouth 2 (two) times daily.)     lisinopril  (PRINIVIL ,ZESTRIL ) 20 MG tablet Take 40 mg by mouth daily.      lovastatin (MEVACOR) 20 MG tablet Take 20 mg by mouth at bedtime.     metoprolol  succinate (TOPROL -XL) 50 MG 24 hr tablet Take 1 tablet (50 mg total) by mouth daily. 90 tablet 0   olmesartan (BENICAR) 20 MG tablet Take 20 mg by mouth daily.     Omega-3 Fatty Acids (FISH OIL) 1000 MG CPDR Take 1,000 mg by mouth in the morning, at noon, and at bedtime.  (Patient taking differently: Take 1,000 mg by mouth 2 (two) times daily.)     Turmeric (QC TUMERIC COMPLEX PO) Take 5 mg by mouth daily. Tumeric-curcumin caps 06-998 mg     No current facility-administered medications for this visit.    Review of Systems: GENERAL:  negative for malaise, night sweats HEENT: No changes in hearing or vision, no nose bleeds or other nasal problems. NECK: Negative for lumps, goiter, pain and significant neck swelling RESPIRATORY: Negative for cough, wheezing CARDIOVASCULAR: Negative for chest pain, leg swelling, palpitations, orthopnea GI: SEE HPI MUSCULOSKELETAL: Negative for joint pain or swelling, back pain, and muscle pain. SKIN: Negative for lesions, rash PSYCH: Negative for sleep disturbance, mood disorder and recent psychosocial stressors. HEMATOLOGY Negative for prolonged bleeding, bruising easily, and swollen nodes. ENDOCRINE: Negative for cold or heat intolerance, polyuria, polydipsia and goiter. NEURO: negative for tremor, gait imbalance, syncope and seizures. The remainder of the review of systems is noncontributory.   Physical Exam: BP (!) 166/75 (BP Location: Left Arm, Patient Position: Sitting, Cuff Size: Normal)   Pulse 64   Temp (!) 97.3 F (36.3 C) (Temporal)   Ht 5' 2 (1.575 m)   Wt 102 lb (46.3 kg)   BMI 18.66 kg/m  GENERAL: The patient is AO x3, in no acute distress. Frail. Elder. HEENT: Head is normocephalic and atraumatic.  EOMI are intact. Mouth is well hydrated and without lesions. NECK: Supple. No masses LUNGS: Clear to auscultation. No presence of rhonchi/wheezing/rales. Adequate chest expansion HEART: RRR, normal s1 and s2. ABDOMEN: Soft, nontender, no guarding, no peritoneal signs, and nondistended. BS +. No masses. EXTREMITIES: Without any cyanosis, clubbing, rash, lesions or edema. NEUROLOGIC: AOx3, no focal motor deficit. SKIN: no jaundice, no rashes   Imaging/Labs: as above  I personally reviewed and interpreted the available labs, imaging and endoscopic files.  Impression and Plan: Deborah Moses is a 89 y.o. female with past medical history of hypertension, third-degree AV block status post pacemaker placement, AAA status post endovascular repair, thoracic aneurysm, atrial  fibrillation, hyperlipidemia, vertigo, CKD, who presents for evaluation of anemia and diarrhea.  The patient has presented history of iron deficiency anemia without overt gastrointestinal bleeding, for which she has been managed with oral iron supplementation recently.  It is unclear why she may have had the iron deficiency anemia.  I explained thoroughly to the patient and the daughter that ideally this should be evaluated with an EGD and colonoscopy.  However, proceeding with these interventions with sedation is very limited as the patient has significant active vascular disease in her abdominal aorta and thoracic aorta.  Specifically, there is a concern of enlarging AAA which may pose periprocedural risks if undergoing endoscopic investigations.  I explained to both the patient and the daughter that as she has never had a colonoscopy in the past it is possible the losses may be related to malignancy, although in the most recent noncontrast CT scan there was no presence of any abnormalities.  This is a difficult situation as wearing a catch 22 scenario since endoscopic investigations are precluded due to her high risk vascular disease, but vascular interventions may be limited due to anemia.  At this point, we will hold off on endoscopic evaluation but will encourage her to continue taking oral iron twice a day and we will recheck her iron stores/CBC at the end of October.  If her hemoglobin improves, it is possible she may be able to undergo further vascular interventions.  Notably, if she is cleared by vascular surgery to undergo an EGD and colonoscopy, she may need to be referred to a higher level of care at a tertiary center due to the need of advanced anesthesia support.  Finally, she is presenting with sudden episodes of diarrhea, which she has been able to manage with Imodium.  She will continue with this for now.  -Continue oral iron two times a day - Check CBC and iron stores at the end of  October -Follow-up closely with vascular surgery regarding aortic aneurysm -Continue with Imodium as needed  All questions were answered.      Deborah Fortune, MD Gastroenterology and Hepatology [facility]

## 2023-11-21 NOTE — Patient Instructions (Signed)
 Continue oral iron two times a day Perform blood workup at the end of October Follow-up closely with vascular surgery regarding aortic aneurysm Continue with Imodium as needed

## 2023-11-23 NOTE — Progress Notes (Signed)
 Thanks , agree to switch to [phone]

## 2023-11-25 DIAGNOSIS — N184 Chronic kidney disease, stage 4 (severe): Secondary | ICD-10-CM | POA: Diagnosis not present

## 2023-11-25 DIAGNOSIS — I10 Essential (primary) hypertension: Secondary | ICD-10-CM | POA: Diagnosis not present

## 2023-12-01 ENCOUNTER — Encounter: Admitting: Internal Medicine

## 2023-12-08 ENCOUNTER — Telehealth: Payer: Self-pay

## 2023-12-08 ENCOUNTER — Ambulatory Visit: Attending: Internal Medicine

## 2023-12-08 DIAGNOSIS — I4891 Unspecified atrial fibrillation: Secondary | ICD-10-CM

## 2023-12-08 NOTE — Telephone Encounter (Signed)
 Unscheduled remote transmission:  AF in progress with poor ventricular control.  Recent increase in events and burden 14.1%  Patient states overall she feels weaker with some increase SOB when she walks up hills but hasn't noticed other symptoms (ie, palpitations, dizziness).    She is currently on Metoprolol  Succinate 50mg  daily. BP's run 150's/70-80's if adjustment is needed.   Denies any changes to her diet, health or medications and has not missed any doses.   Will forward for review with Dr. Waddell and patient will expect a call back with outcome.  (Note: patient lives in [place] and prefers not to come to [place] - ie: AF clinic).

## 2023-12-09 MED ORDER — DIGOXIN 125 MCG PO TABS: 0.1250 mg | ORAL_TABLET | ORAL | 3 refills | Status: DC

## 2023-12-09 NOTE — Telephone Encounter (Signed)
 Start digoxin 0.125 mg daily, none on Saturday or Sunday.  Check digoxin level in 3 weeks.

## 2023-12-09 NOTE — Telephone Encounter (Signed)
 Outreach made to Pt.  Advised Pt Dr. Waddell wants her to start taking Digoxin 0.125 mg by mouth daily Monday-Friday none on Saturday or Sunday.  Advised she will need lab work in 3 weeks.  Prescription sent to [facility] as requested.  Per Pt should would like a letter advising her when to get her lab work.  Will send letter.  All questions answered.

## 2023-12-10 ENCOUNTER — Emergency Department (HOSPITAL_COMMUNITY)

## 2023-12-10 ENCOUNTER — Other Ambulatory Visit: Payer: Self-pay

## 2023-12-10 ENCOUNTER — Inpatient Hospital Stay (HOSPITAL_COMMUNITY)
Admission: EM | Admit: 2023-12-10 | Discharge: 2023-12-14 | DRG: 682 | Disposition: A | Discharge status: Hospice | Attending: Internal Medicine | Admitting: Internal Medicine

## 2023-12-10 DIAGNOSIS — E875 Hyperkalemia: Secondary | ICD-10-CM | POA: Diagnosis present

## 2023-12-10 DIAGNOSIS — I712 Thoracic aortic aneurysm, without rupture, unspecified: Secondary | ICD-10-CM | POA: Diagnosis not present

## 2023-12-10 DIAGNOSIS — Z9842 Cataract extraction status, left eye: Secondary | ICD-10-CM

## 2023-12-10 DIAGNOSIS — Z66 Do not resuscitate: Secondary | ICD-10-CM | POA: Diagnosis present

## 2023-12-10 DIAGNOSIS — W19XXXA Unspecified fall, initial encounter: Secondary | ICD-10-CM | POA: Diagnosis not present

## 2023-12-10 DIAGNOSIS — Z9841 Cataract extraction status, right eye: Secondary | ICD-10-CM

## 2023-12-10 DIAGNOSIS — N179 Acute kidney failure, unspecified: Principal | ICD-10-CM | POA: Diagnosis present

## 2023-12-10 DIAGNOSIS — D6489 Other specified anemias: Secondary | ICD-10-CM | POA: Diagnosis present

## 2023-12-10 DIAGNOSIS — J90 Pleural effusion, not elsewhere classified: Secondary | ICD-10-CM | POA: Diagnosis not present

## 2023-12-10 DIAGNOSIS — S61011A Laceration without foreign body of right thumb without damage to nail, initial encounter: Secondary | ICD-10-CM | POA: Diagnosis not present

## 2023-12-10 DIAGNOSIS — J810 Acute pulmonary edema: Secondary | ICD-10-CM | POA: Diagnosis present

## 2023-12-10 DIAGNOSIS — Z789 Other specified health status: Secondary | ICD-10-CM | POA: Diagnosis not present

## 2023-12-10 DIAGNOSIS — R197 Diarrhea, unspecified: Secondary | ICD-10-CM | POA: Diagnosis present

## 2023-12-10 DIAGNOSIS — I517 Cardiomegaly: Secondary | ICD-10-CM | POA: Diagnosis not present

## 2023-12-10 DIAGNOSIS — E8770 Fluid overload, unspecified: Secondary | ICD-10-CM | POA: Diagnosis present

## 2023-12-10 DIAGNOSIS — E872 Acidosis, unspecified: Secondary | ICD-10-CM | POA: Diagnosis not present

## 2023-12-10 DIAGNOSIS — S0990XA Unspecified injury of head, initial encounter: Secondary | ICD-10-CM | POA: Diagnosis not present

## 2023-12-10 DIAGNOSIS — N19 Unspecified kidney failure: Secondary | ICD-10-CM | POA: Diagnosis present

## 2023-12-10 DIAGNOSIS — S0083XA Contusion of other part of head, initial encounter: Secondary | ICD-10-CM | POA: Diagnosis not present

## 2023-12-10 DIAGNOSIS — Z950 Presence of cardiac pacemaker: Secondary | ICD-10-CM

## 2023-12-10 DIAGNOSIS — S3993XA Unspecified injury of pelvis, initial encounter: Secondary | ICD-10-CM | POA: Diagnosis not present

## 2023-12-10 DIAGNOSIS — R296 Repeated falls: Secondary | ICD-10-CM | POA: Diagnosis present

## 2023-12-10 DIAGNOSIS — Z681 Body mass index (BMI) 19 or less, adult: Secondary | ICD-10-CM

## 2023-12-10 DIAGNOSIS — Z7983 Long term (current) use of bisphosphonates: Secondary | ICD-10-CM

## 2023-12-10 DIAGNOSIS — I7143 Infrarenal abdominal aortic aneurysm, without rupture: Secondary | ICD-10-CM | POA: Diagnosis not present

## 2023-12-10 DIAGNOSIS — W010XXA Fall on same level from slipping, tripping and stumbling without subsequent striking against object, initial encounter: Secondary | ICD-10-CM | POA: Diagnosis present

## 2023-12-10 DIAGNOSIS — I452 Bifascicular block: Secondary | ICD-10-CM | POA: Diagnosis not present

## 2023-12-10 DIAGNOSIS — R0600 Dyspnea, unspecified: Secondary | ICD-10-CM | POA: Diagnosis not present

## 2023-12-10 DIAGNOSIS — R220 Localized swelling, mass and lump, head: Secondary | ICD-10-CM | POA: Diagnosis not present

## 2023-12-10 DIAGNOSIS — Z23 Encounter for immunization: Secondary | ICD-10-CM

## 2023-12-10 DIAGNOSIS — I672 Cerebral atherosclerosis: Secondary | ICD-10-CM | POA: Diagnosis not present

## 2023-12-10 DIAGNOSIS — Z79899 Other long term (current) drug therapy: Secondary | ICD-10-CM

## 2023-12-10 DIAGNOSIS — N184 Chronic kidney disease, stage 4 (severe): Secondary | ICD-10-CM | POA: Diagnosis present

## 2023-12-10 DIAGNOSIS — N185 Chronic kidney disease, stage 5: Secondary | ICD-10-CM

## 2023-12-10 DIAGNOSIS — M160 Bilateral primary osteoarthritis of hip: Secondary | ICD-10-CM | POA: Diagnosis not present

## 2023-12-10 DIAGNOSIS — I129 Hypertensive chronic kidney disease with stage 1 through stage 4 chronic kidney disease, or unspecified chronic kidney disease: Secondary | ICD-10-CM | POA: Diagnosis not present

## 2023-12-10 DIAGNOSIS — I480 Paroxysmal atrial fibrillation: Secondary | ICD-10-CM | POA: Diagnosis not present

## 2023-12-10 DIAGNOSIS — Z961 Presence of intraocular lens: Secondary | ICD-10-CM | POA: Diagnosis present

## 2023-12-10 DIAGNOSIS — Z515 Encounter for palliative care: Secondary | ICD-10-CM

## 2023-12-10 DIAGNOSIS — I447 Left bundle-branch block, unspecified: Secondary | ICD-10-CM | POA: Diagnosis not present

## 2023-12-10 DIAGNOSIS — R918 Other nonspecific abnormal finding of lung field: Secondary | ICD-10-CM | POA: Diagnosis not present

## 2023-12-10 DIAGNOSIS — I959 Hypotension, unspecified: Secondary | ICD-10-CM | POA: Diagnosis not present

## 2023-12-10 DIAGNOSIS — R7303 Prediabetes: Secondary | ICD-10-CM | POA: Diagnosis present

## 2023-12-10 DIAGNOSIS — Z7189 Other specified counseling: Secondary | ICD-10-CM | POA: Diagnosis not present

## 2023-12-10 DIAGNOSIS — R609 Edema, unspecified: Secondary | ICD-10-CM | POA: Diagnosis not present

## 2023-12-10 DIAGNOSIS — S199XXA Unspecified injury of neck, initial encounter: Secondary | ICD-10-CM | POA: Diagnosis not present

## 2023-12-10 DIAGNOSIS — S0081XA Abrasion of other part of head, initial encounter: Secondary | ICD-10-CM | POA: Diagnosis present

## 2023-12-10 DIAGNOSIS — D631 Anemia in chronic kidney disease: Secondary | ICD-10-CM | POA: Diagnosis present

## 2023-12-10 DIAGNOSIS — E871 Hypo-osmolality and hyponatremia: Secondary | ICD-10-CM | POA: Diagnosis present

## 2023-12-10 DIAGNOSIS — R000 Tachycardia, unspecified: Secondary | ICD-10-CM | POA: Diagnosis not present

## 2023-12-10 DIAGNOSIS — Z7901 Long term (current) use of anticoagulants: Secondary | ICD-10-CM

## 2023-12-10 DIAGNOSIS — S299XXA Unspecified injury of thorax, initial encounter: Secondary | ICD-10-CM | POA: Diagnosis not present

## 2023-12-10 DIAGNOSIS — M154 Erosive (osteo)arthritis: Secondary | ICD-10-CM | POA: Diagnosis not present

## 2023-12-10 DIAGNOSIS — E785 Hyperlipidemia, unspecified: Secondary | ICD-10-CM | POA: Diagnosis present

## 2023-12-10 DIAGNOSIS — J9601 Acute respiratory failure with hypoxia: Secondary | ICD-10-CM | POA: Diagnosis present

## 2023-12-10 DIAGNOSIS — R0689 Other abnormalities of breathing: Secondary | ICD-10-CM | POA: Diagnosis not present

## 2023-12-10 DIAGNOSIS — J811 Chronic pulmonary edema: Secondary | ICD-10-CM | POA: Diagnosis not present

## 2023-12-10 DIAGNOSIS — R636 Underweight: Secondary | ICD-10-CM | POA: Diagnosis present

## 2023-12-10 DIAGNOSIS — I71011 Dissection of aortic arch: Secondary | ICD-10-CM | POA: Diagnosis not present

## 2023-12-10 DIAGNOSIS — I6782 Cerebral ischemia: Secondary | ICD-10-CM | POA: Diagnosis not present

## 2023-12-10 DIAGNOSIS — Z8679 Personal history of other diseases of the circulatory system: Secondary | ICD-10-CM

## 2023-12-10 DIAGNOSIS — Z9181 History of falling: Secondary | ICD-10-CM

## 2023-12-10 DIAGNOSIS — N261 Atrophy of kidney (terminal): Secondary | ICD-10-CM | POA: Diagnosis not present

## 2023-12-10 DIAGNOSIS — R251 Tremor, unspecified: Secondary | ICD-10-CM | POA: Diagnosis present

## 2023-12-10 DIAGNOSIS — S6991XA Unspecified injury of right wrist, hand and finger(s), initial encounter: Secondary | ICD-10-CM | POA: Diagnosis not present

## 2023-12-10 DIAGNOSIS — Z9049 Acquired absence of other specified parts of digestive tract: Secondary | ICD-10-CM

## 2023-12-10 DIAGNOSIS — Z8249 Family history of ischemic heart disease and other diseases of the circulatory system: Secondary | ICD-10-CM

## 2023-12-10 DIAGNOSIS — R0902 Hypoxemia: Secondary | ICD-10-CM | POA: Diagnosis not present

## 2023-12-10 LAB — CBC WITH DIFFERENTIAL/PLATELET
Abs Immature Granulocytes: 0.04 K/uL (ref 0.00–0.07)
Basophils Absolute: 0.1 K/uL (ref 0.0–0.1)
Basophils Relative: 1 %
Eosinophils Absolute: 0.1 K/uL (ref 0.0–0.5)
Eosinophils Relative: 1 %
HCT: 29.9 % — ABNORMAL LOW (ref 36.0–46.0)
Hemoglobin: 9.5 g/dL — ABNORMAL LOW (ref 12.0–15.0)
Immature Granulocytes: 0 %
Lymphocytes Relative: 16 %
Lymphs Abs: 1.6 K/uL (ref 0.7–4.0)
MCH: 30 pg (ref 26.0–34.0)
MCHC: 31.8 g/dL (ref 30.0–36.0)
MCV: 94.3 fL (ref 80.0–100.0)
Monocytes Absolute: 0.7 K/uL (ref 0.1–1.0)
Monocytes Relative: 7 %
Neutro Abs: 7.2 K/uL (ref 1.7–7.7)
Neutrophils Relative %: 75 %
Platelets: 323 K/uL (ref 150–400)
RBC: 3.17 MIL/uL — ABNORMAL LOW (ref 3.87–5.11)
RDW: 13.3 % (ref 11.5–15.5)
WBC: 9.6 K/uL (ref 4.0–10.5)
nRBC: 0 % (ref 0.0–0.2)

## 2023-12-10 LAB — COMPREHENSIVE METABOLIC PANEL WITH GFR
ALT: 22 U/L (ref 0–44)
AST: 27 U/L (ref 15–41)
Albumin: 3.1 g/dL — ABNORMAL LOW (ref 3.5–5.0)
Alkaline Phosphatase: 112 U/L (ref 38–126)
Anion gap: 15 (ref 5–15)
BUN: 92 mg/dL — ABNORMAL HIGH (ref 8–23)
CO2: 14 mmol/L — ABNORMAL LOW (ref 22–32)
Calcium: 10.3 mg/dL (ref 8.9–10.3)
Chloride: 104 mmol/L (ref 98–111)
Creatinine, Ser: 7.4 mg/dL — ABNORMAL HIGH (ref 0.44–1.00)
GFR, Estimated: 5 mL/min — ABNORMAL LOW (ref >60)
Glucose, Bld: 136 mg/dL — ABNORMAL HIGH (ref 70–99)
Potassium: 6.6 mmol/L (ref 3.5–5.1)
Sodium: 133 mmol/L — ABNORMAL LOW (ref 135–145)
Total Bilirubin: 0.5 mg/dL (ref 0.0–1.2)
Total Protein: 6.5 g/dL (ref 6.5–8.1)

## 2023-12-10 LAB — I-STAT CHEM 8, ED
BUN: 86 mg/dL — ABNORMAL HIGH (ref 8–23)
Calcium, Ion: 1.34 mmol/L (ref 1.15–1.40)
Chloride: 109 mmol/L (ref 98–111)
Creatinine, Ser: 8.1 mg/dL — ABNORMAL HIGH (ref 0.44–1.00)
Glucose, Bld: 136 mg/dL — ABNORMAL HIGH (ref 70–99)
HCT: 29 % — ABNORMAL LOW (ref 36.0–46.0)
Hemoglobin: 9.9 g/dL — ABNORMAL LOW (ref 12.0–15.0)
Potassium: 6.7 mmol/L (ref 3.5–5.1)
Sodium: 132 mmol/L — ABNORMAL LOW (ref 135–145)
TCO2: 16 mmol/L — ABNORMAL LOW (ref 22–32)

## 2023-12-10 LAB — CBG MONITORING, ED: Glucose-Capillary: 149 mg/dL — ABNORMAL HIGH (ref 70–99)

## 2023-12-10 LAB — DIGOXIN LEVEL: Digoxin Level: <0.6 ng/mL — ABNORMAL LOW (ref 0.8–2.0)

## 2023-12-10 LAB — I-STAT CG4 LACTIC ACID, ED: Lactic Acid, Venous: 0.8 mmol/L (ref 0.5–1.9)

## 2023-12-10 MED ORDER — INSULIN ASPART 100 UNIT/ML IV SOLN
5.0000 [IU] | Freq: Once | INTRAVENOUS | Status: AC
  Administered 2023-12-10: 5 [IU] via INTRAVENOUS

## 2023-12-10 MED ORDER — DEXTROSE 50 % IV SOLN
1.0000 | Freq: Once | INTRAVENOUS | Status: AC
  Administered 2023-12-10: 50 mL via INTRAVENOUS
  Filled 2023-12-10: qty 50

## 2023-12-10 MED ORDER — LIDOCAINE-EPINEPHRINE 1 %-1:100000 IJ SOLN
10.0000 mL | Freq: Once | INTRAMUSCULAR | Status: AC
  Administered 2023-12-10: 10 mL
  Filled 2023-12-10: qty 1

## 2023-12-10 MED ORDER — SODIUM CHLORIDE 0.9 % IV SOLN: INTRAVENOUS | Status: DC

## 2023-12-10 MED ORDER — ONDANSETRON HCL 4 MG/2ML IJ SOLN
4.0000 mg | Freq: Once | INTRAMUSCULAR | Status: AC
  Administered 2023-12-10: 4 mg via INTRAVENOUS
  Filled 2023-12-10: qty 2

## 2023-12-10 MED ORDER — SODIUM ZIRCONIUM CYCLOSILICATE 10 G PO PACK
10.0000 g | PACK | Freq: Once | ORAL | Status: AC
  Administered 2023-12-10: 10 g via ORAL
  Filled 2023-12-10: qty 1

## 2023-12-10 MED ORDER — TETANUS-DIPHTH-ACELL PERTUSSIS 5-2-15.5 LF-MCG/0.5 IM SUSP
0.5000 mL | Freq: Once | INTRAMUSCULAR | Status: AC
  Administered 2023-12-10: 0.5 mL via INTRAMUSCULAR
  Filled 2023-12-10: qty 0.5

## 2023-12-10 MED ORDER — FENTANYL CITRATE (PF) 50 MCG/ML IJ SOSY
50.0000 ug | PREFILLED_SYRINGE | Freq: Once | INTRAMUSCULAR | Status: AC
  Administered 2023-12-10: 50 ug via INTRAVENOUS
  Filled 2023-12-10: qty 1

## 2023-12-10 MED ORDER — SODIUM ZIRCONIUM CYCLOSILICATE 10 G PO PACK
10.0000 g | PACK | Freq: Three times a day (TID) | ORAL | Status: DC
  Administered 2023-12-11 (×2): 10 g via ORAL
  Filled 2023-12-10 (×2): qty 1

## 2023-12-10 MED ORDER — SODIUM ZIRCONIUM CYCLOSILICATE 10 G PO PACK
10.0000 g | PACK | Freq: Every day | ORAL | Status: DC
Start: 2023-12-13 — End: 2023-12-15

## 2023-12-10 MED ORDER — CALCIUM GLUCONATE-NACL 1-0.675 GM/50ML-% IV SOLN
1.0000 g | Freq: Once | INTRAVENOUS | Status: AC
  Administered 2023-12-10: 1000 mg via INTRAVENOUS
  Filled 2023-12-10: qty 50

## 2023-12-10 MED ORDER — SODIUM CHLORIDE 0.9 % IV BOLUS
500.0000 mL | Freq: Once | INTRAVENOUS | Status: AC
  Administered 2023-12-11: 500 mL via INTRAVENOUS

## 2023-12-10 MED ORDER — LACTATED RINGERS IV BOLUS
1000.0000 mL | Freq: Once | INTRAVENOUS | Status: AC
Start: 2023-12-10 — End: 2023-12-11
  Administered 2023-12-10: 1000 mL via INTRAVENOUS

## 2023-12-10 NOTE — ED Triage Notes (Signed)
 Pt BIB [facility] from home c/o mechanical fall on thinners. Pt was walking to mailbox and fell at ground level. Pt did hit left side of face and upon assessment, there is swelling to her left face. Pt also has skin tear on right thumb. Pt does take Eliquis  for afib. C-collar placed at hospital.  2mg  of morphine given en route HR 71 109/84 95% RA

## 2023-12-10 NOTE — ED Notes (Signed)
 C-collar placed on pt.

## 2023-12-10 NOTE — ED Notes (Signed)
 C-collar removed by resident

## 2023-12-10 NOTE — Progress Notes (Signed)
 Chaplain responds to level 2 trauma, fall on thinners, and provides compassionate presence as medical team cares for pt. No family or loved ones present.

## 2023-12-10 NOTE — ED Notes (Signed)
 Face and thumb wound care performed.

## 2023-12-10 NOTE — ED Provider Notes (Signed)
 [place] EMERGENCY DEPARTMENT AT [facility] Provider Note   CSN: [id number] Arrival date & time: 12/10/23  1940     Patient presents with: [name]   Deborah Moses is a 89 y.o. female.  {Add pertinent medical, surgical, social history, OB history to [id number]}  Fall       Prior to Admission medications   Medication Sig Start Date End Date Taking? Authorizing Provider  acetaminophen  (TYLENOL ) 500 MG tablet Take 500 mg by mouth every 6 (six) hours as needed for moderate pain.     [provider]  alendronate (FOSAMAX) 70 MG tablet Take 70 mg by mouth once a week. Take with a full glass of water  on an empty stomach.    [provider]  amLODipine  (NORVASC ) 5 MG tablet Take 5 mg by mouth daily.    [provider]  apixaban  (ELIQUIS ) 2.5 MG TABS tablet Take 1 tablet (2.5 mg total) by mouth 2 (two) times daily. 04/15/23   Waddell Danelle ORN, MD  calcium -vitamin D  (OSCAL WITH D) 500-200 MG-UNIT TABS tablet Take 1 tablet by mouth daily.     [provider]  digoxin (LANOXIN) 0.125 MG tablet Take 1 tablet (0.125 mg total) by mouth as directed. Take one tablet by mouth Monday, Tuesday, Wednesday, Thursday and Friday.  Do NOT take on Saturday or Sunday. 12/09/23   Waddell Danelle ORN, MD  famotidine (PEPCID) 20 MG tablet Take 20 mg by mouth daily. Patient taking differently: Take 20 mg by mouth as needed.    [provider]  Ferrous Sulfate (IRON PO) Take by mouth. Patient taking differently: Take by mouth 2 (two) times daily.    [provider]  lisinopril  (PRINIVIL ,ZESTRIL ) 20 MG tablet Take 40 mg by mouth daily.     [provider]  lovastatin (MEVACOR) 20 MG tablet Take 20 mg by mouth at bedtime.    [provider]  metoprolol  succinate (TOPROL -XL) 50 MG 24 hr tablet Take 1 tablet (50 mg total) by mouth daily. 09/26/20   Lesia Ozell Barter, PA-C  olmesartan (BENICAR) 20 MG tablet Take 20 mg by mouth daily.  08/30/22   [provider]  Omega-3 Fatty Acids (FISH OIL) 1000 MG CPDR Take 1,000 mg by mouth in the morning, at noon, and at bedtime.  Patient taking differently: Take 1,000 mg by mouth 2 (two) times daily.    [provider]  Turmeric (QC TUMERIC COMPLEX PO) Take 5 mg by mouth daily. Tumeric-curcumin caps 06-998 mg    [provider]    Allergies: Patient has no known allergies.    Review of Systems  Updated Vital Signs BP (!) 172/70   Pulse 66   Resp 18   SpO2 95%   Physical Exam  (all labs ordered are listed, but only abnormal results are displayed) Labs Reviewed  I-STAT CHEM 8, ED - Abnormal; Notable for the following components:      Result Value   Sodium 132 (*)    Potassium 6.7 (*)    BUN 86 (*)    Creatinine, Ser 8.10 (*)    Glucose, Bld 136 (*)    TCO2 16 (*)    Hemoglobin 9.9 (*)    HCT 29.0 (*)    All other components within normal limits  I-STAT CG4 LACTIC ACID, ED    EKG: None  Radiology: No results found.  {Document cardiac monitor, telemetry assessment procedure when appropriate:32947} .Laceration Repair  Date/Time: 12/10/2023 11:26 PM  Performed by:  Sharlet Dowdy, MD Authorized by: Freddi Hamilton, MD   Consent:    Consent obtained:  Verbal   Consent given by:  Patient   Risks, benefits, and alternatives were discussed: yes     Risks discussed:  Infection, pain and poor cosmetic result   Alternatives discussed:  No treatment and observation Universal protocol:    Procedure explained and questions answered to patient or proxy's satisfaction: yes     Imaging studies available: yes     Site/side marked: yes     Immediately prior to procedure, a time out was called: yes     Patient identity confirmed:  Verbally with patient and arm band Anesthesia:    Anesthesia method:  Local infiltration   Local anesthetic:  Lidocaine  1% WITH epi Laceration details:    Location:  Finger   Finger location:  R thumb   Length (cm):   2 Pre-procedure details:    Preparation:  Patient was prepped and draped in usual sterile fashion and imaging obtained to evaluate for foreign bodies Exploration:    Hemostasis achieved with:  Direct pressure   Imaging obtained: x-ray     Imaging outcome: foreign body noted     Wound extent: areolar tissue not violated, fascia not violated, no foreign body, no signs of injury, no nerve damage, no tendon damage, no underlying fracture and no vascular damage     Contaminated: no   Treatment:    Area cleansed with:  Saline   Amount of cleaning:  Standard   Irrigation solution:  Sterile saline   Irrigation method:  Syringe   Visualized foreign bodies/material removed: yes   Skin repair:    Repair method:  Sutures   Suture size:  6-0   Suture material:  Prolene   Suture technique:  Simple interrupted   Number of sutures:  6 Approximation:    Approximation:  Close Repair type:    Repair type:  Simple Post-procedure details:    Dressing:  Non-adherent dressing and splint for protection   Procedure completion:  Tolerated    Medications Ordered in the ED  Tdap (ADACEL) injection 0.5 mL (has no administration in time range)  fentaNYL  (SUBLIMAZE ) injection 50 mcg (has no administration in time range)    Clinical Course as of 12/10/23 2326  Sat Dec 10, 2023  2137 Lac repair and medical admission  [LB]    Clinical Course User Index [LB] Sharlet Dowdy, MD   {Click here for ABCD2, HEART and other calculators REFRESH Note before signing:1}                              Medical Decision Making Amount and/or Complexity of Data Reviewed Labs: ordered. Radiology: ordered.  Risk OTC drugs. Prescription drug management.   ***  {Document critical care time when appropriate  Document review of labs and clinical decision tools ie CHADS2VASC2, etc  Document your independent review of radiology images and any outside records  Document your discussion with family members, caretakers and  with consultants  Document social determinants of health affecting pt's care  Document your decision making why or why not admission, treatments were needed:32947:::1}   Final diagnoses:  None    ED Discharge Orders     None

## 2023-12-11 ENCOUNTER — Inpatient Hospital Stay (HOSPITAL_COMMUNITY)

## 2023-12-11 ENCOUNTER — Encounter (HOSPITAL_COMMUNITY): Payer: Self-pay | Admitting: Internal Medicine

## 2023-12-11 DIAGNOSIS — N184 Chronic kidney disease, stage 4 (severe): Secondary | ICD-10-CM | POA: Diagnosis not present

## 2023-12-11 DIAGNOSIS — N179 Acute kidney failure, unspecified: Secondary | ICD-10-CM | POA: Diagnosis not present

## 2023-12-11 DIAGNOSIS — Z950 Presence of cardiac pacemaker: Secondary | ICD-10-CM | POA: Diagnosis not present

## 2023-12-11 DIAGNOSIS — E875 Hyperkalemia: Secondary | ICD-10-CM

## 2023-12-11 DIAGNOSIS — N261 Atrophy of kidney (terminal): Secondary | ICD-10-CM | POA: Diagnosis not present

## 2023-12-11 DIAGNOSIS — J811 Chronic pulmonary edema: Secondary | ICD-10-CM | POA: Diagnosis not present

## 2023-12-11 DIAGNOSIS — I712 Thoracic aortic aneurysm, without rupture, unspecified: Secondary | ICD-10-CM | POA: Diagnosis not present

## 2023-12-11 DIAGNOSIS — R918 Other nonspecific abnormal finding of lung field: Secondary | ICD-10-CM | POA: Diagnosis not present

## 2023-12-11 DIAGNOSIS — J90 Pleural effusion, not elsewhere classified: Secondary | ICD-10-CM | POA: Diagnosis not present

## 2023-12-11 DIAGNOSIS — I517 Cardiomegaly: Secondary | ICD-10-CM | POA: Diagnosis not present

## 2023-12-11 LAB — POTASSIUM
Potassium: 5.8 mmol/L — ABNORMAL HIGH (ref 3.5–5.1)
Potassium: 5.8 mmol/L — ABNORMAL HIGH (ref 3.5–5.1)

## 2023-12-11 LAB — CBC
HCT: 25.6 % — ABNORMAL LOW (ref 36.0–46.0)
Hemoglobin: 8.1 g/dL — ABNORMAL LOW (ref 12.0–15.0)
MCH: 29.8 pg (ref 26.0–34.0)
MCHC: 31.6 g/dL (ref 30.0–36.0)
MCV: 94.1 fL (ref 80.0–100.0)
Platelets: 263 K/uL (ref 150–400)
RBC: 2.72 MIL/uL — ABNORMAL LOW (ref 3.87–5.11)
RDW: 13.5 % (ref 11.5–15.5)
WBC: 8.8 K/uL (ref 4.0–10.5)
nRBC: 0.3 % — ABNORMAL HIGH (ref 0.0–0.2)

## 2023-12-11 LAB — RENAL FUNCTION PANEL
Albumin: 2.7 g/dL — ABNORMAL LOW (ref 3.5–5.0)
Anion gap: 13 (ref 5–15)
BUN: 88 mg/dL — ABNORMAL HIGH (ref 8–23)
CO2: 15 mmol/L — ABNORMAL LOW (ref 22–32)
Calcium: 9.9 mg/dL (ref 8.9–10.3)
Chloride: 105 mmol/L (ref 98–111)
Creatinine, Ser: 7.02 mg/dL — ABNORMAL HIGH (ref 0.44–1.00)
GFR, Estimated: 5 mL/min — ABNORMAL LOW (ref >60)
Glucose, Bld: 110 mg/dL — ABNORMAL HIGH (ref 70–99)
Phosphorus: 4.1 mg/dL (ref 2.5–4.6)
Potassium: 6.7 mmol/L (ref 3.5–5.1)
Sodium: 133 mmol/L — ABNORMAL LOW (ref 135–145)

## 2023-12-11 LAB — BLOOD GAS, VENOUS
Acid-base deficit: 10.4 mmol/L — ABNORMAL HIGH (ref 0.0–2.0)
Bicarbonate: 15.7 mmol/L — ABNORMAL LOW (ref 20.0–28.0)
O2 Saturation: 74.6 %
Patient temperature: 36.4
pCO2, Ven: 34 mmHg — ABNORMAL LOW (ref 44–60)
pH, Ven: 7.27 (ref 7.25–7.43)
pO2, Ven: 41 mmHg (ref 32–45)

## 2023-12-11 LAB — URINALYSIS, ROUTINE W REFLEX MICROSCOPIC
Bilirubin Urine: NEGATIVE
Glucose, UA: NEGATIVE mg/dL
Hgb urine dipstick: NEGATIVE
Ketones, ur: NEGATIVE mg/dL
Nitrite: NEGATIVE
Protein, ur: 30 mg/dL — AB
Specific Gravity, Urine: 1.019 (ref 1.005–1.030)
pH: 5 (ref 5.0–8.0)

## 2023-12-11 LAB — BASIC METABOLIC PANEL WITH GFR
Anion gap: 14 (ref 5–15)
Anion gap: 13 (ref 5–15)
BUN: 87 mg/dL — ABNORMAL HIGH (ref 8–23)
BUN: 87 mg/dL — ABNORMAL HIGH (ref 8–23)
CO2: 15 mmol/L — ABNORMAL LOW (ref 22–32)
CO2: 16 mmol/L — ABNORMAL LOW (ref 22–32)
Calcium: 9.8 mg/dL (ref 8.9–10.3)
Calcium: 9.8 mg/dL (ref 8.9–10.3)
Chloride: 105 mmol/L (ref 98–111)
Chloride: 105 mmol/L (ref 98–111)
Creatinine, Ser: 7.04 mg/dL — ABNORMAL HIGH (ref 0.44–1.00)
Creatinine, Ser: 7.33 mg/dL — ABNORMAL HIGH (ref 0.44–1.00)
GFR, Estimated: 5 mL/min — ABNORMAL LOW (ref >60)
GFR, Estimated: 5 mL/min — ABNORMAL LOW (ref >60)
Glucose, Bld: 113 mg/dL — ABNORMAL HIGH (ref 70–99)
Glucose, Bld: 99 mg/dL (ref 70–99)
Potassium: 6.6 mmol/L (ref 3.5–5.1)
Potassium: 6.1 mmol/L — ABNORMAL HIGH (ref 3.5–5.1)
Sodium: 134 mmol/L — ABNORMAL LOW (ref 135–145)
Sodium: 134 mmol/L — ABNORMAL LOW (ref 135–145)

## 2023-12-11 LAB — CK
Total CK: 58 U/L (ref 38–234)
Total CK: 56 U/L (ref 38–234)

## 2023-12-11 LAB — HEPATITIS B SURFACE ANTIGEN: Hepatitis B Surface Ag: NONREACTIVE

## 2023-12-11 LAB — PHOSPHORUS: Phosphorus: 4.1 mg/dL (ref 2.5–4.6)

## 2023-12-11 LAB — CBG MONITORING, ED: Glucose-Capillary: 120 mg/dL — ABNORMAL HIGH (ref 70–99)

## 2023-12-11 LAB — MAGNESIUM: Magnesium: 2.4 mg/dL (ref 1.7–2.4)

## 2023-12-11 MED ORDER — INSULIN ASPART 100 UNIT/ML IV SOLN
5.0000 [IU] | Freq: Once | INTRAVENOUS | Status: AC
  Administered 2023-12-11: 5 [IU] via INTRAVENOUS

## 2023-12-11 MED ORDER — METOPROLOL SUCCINATE ER 50 MG PO TB24
50.0000 mg | ORAL_TABLET | Freq: Every day | ORAL | Status: DC
  Administered 2023-12-11 – 2023-12-14 (×4): 50 mg via ORAL
  Filled 2023-12-11 (×2): qty 1
  Filled 2023-12-11: qty 2
  Filled 2023-12-11: qty 1

## 2023-12-11 MED ORDER — ALBUMIN HUMAN 25 % IV SOLN
12.5000 g | Freq: Once | INTRAVENOUS | Status: AC
  Administered 2023-12-11: 25 g via INTRAVENOUS

## 2023-12-11 MED ORDER — CALCIUM GLUCONATE-NACL 1-0.675 GM/50ML-% IV SOLN
1.0000 g | INTRAVENOUS | Status: AC
  Administered 2023-12-11: 1000 mg via INTRAVENOUS
  Filled 2023-12-11: qty 50

## 2023-12-11 MED ORDER — FUROSEMIDE 10 MG/ML IJ SOLN
100.0000 mg | Freq: Once | INTRAVENOUS | Status: AC
  Administered 2023-12-11: 100 mg via INTRAVENOUS
  Filled 2023-12-11: qty 10

## 2023-12-11 MED ORDER — ACETAMINOPHEN 325 MG PO TABS
650.0000 mg | ORAL_TABLET | Freq: Four times a day (QID) | ORAL | Status: DC | PRN
  Administered 2023-12-11 – 2023-12-12 (×2): 650 mg via ORAL
  Filled 2023-12-11 (×2): qty 2

## 2023-12-11 MED ORDER — CHLORHEXIDINE GLUCONATE CLOTH 2 % EX PADS
6.0000 | MEDICATED_PAD | Freq: Every day | CUTANEOUS | Status: DC
  Administered 2023-12-12: 6 via TOPICAL

## 2023-12-11 MED ORDER — ALBUTEROL SULFATE (2.5 MG/3ML) 0.083% IN NEBU
2.5000 mg | INHALATION_SOLUTION | RESPIRATORY_TRACT | Status: DC
  Administered 2023-12-11 – 2023-12-12 (×4): 2.5 mg via RESPIRATORY_TRACT
  Filled 2023-12-11 (×4): qty 3

## 2023-12-11 MED ORDER — HEPARIN SODIUM (PORCINE) 1000 UNIT/ML IJ SOLN
INTRAMUSCULAR | Status: AC
  Filled 2023-12-11: qty 4

## 2023-12-11 MED ORDER — ALTEPLASE 2 MG IJ SOLR: 2.0000 mg | Freq: Once | INTRAMUSCULAR | Status: DC | PRN

## 2023-12-11 MED ORDER — SODIUM BICARBONATE 8.4 % IV SOLN
INTRAVENOUS | Status: DC
  Filled 2023-12-11: qty 150

## 2023-12-11 MED ORDER — FUROSEMIDE 10 MG/ML IJ SOLN
120.0000 mg | Freq: Once | INTRAVENOUS | Status: AC
  Administered 2023-12-11: 120 mg via INTRAVENOUS
  Filled 2023-12-11: qty 10

## 2023-12-11 MED ORDER — DEXTROSE 50 % IV SOLN
1.0000 | Freq: Once | INTRAVENOUS | Status: AC
  Administered 2023-12-11: 50 mL via INTRAVENOUS
  Filled 2023-12-11: qty 50

## 2023-12-11 MED ORDER — SODIUM CHLORIDE 0.9 % IV SOLN: INTRAVENOUS | Status: DC

## 2023-12-11 MED ORDER — SODIUM ZIRCONIUM CYCLOSILICATE 10 G PO PACK
10.0000 g | PACK | Freq: Three times a day (TID) | ORAL | Status: DC
  Administered 2023-12-11 – 2023-12-12 (×2): 10 g via ORAL
  Filled 2023-12-11 (×3): qty 1

## 2023-12-11 MED ORDER — ALBUMIN HUMAN 25 % IV SOLN
INTRAVENOUS | Status: AC
  Filled 2023-12-11: qty 100

## 2023-12-11 MED ORDER — HEPARIN SODIUM (PORCINE) 1000 UNIT/ML DIALYSIS
1000.0000 [IU] | INTRAMUSCULAR | Status: DC | PRN
  Administered 2023-12-11: 2400 [IU]

## 2023-12-11 MED ORDER — SODIUM BICARBONATE 650 MG PO TABS
650.0000 mg | ORAL_TABLET | Freq: Two times a day (BID) | ORAL | Status: DC
  Administered 2023-12-11: 650 mg via ORAL
  Filled 2023-12-11: qty 1

## 2023-12-11 MED ORDER — CHLORHEXIDINE GLUCONATE CLOTH 2 % EX PADS: 6.0000 | MEDICATED_PAD | Freq: Every day | CUTANEOUS | Status: DC

## 2023-12-11 MED ORDER — AMLODIPINE BESYLATE 5 MG PO TABS
5.0000 mg | ORAL_TABLET | Freq: Every day | ORAL | Status: DC
  Administered 2023-12-11 – 2023-12-13 (×3): 5 mg via ORAL
  Filled 2023-12-11 (×3): qty 1

## 2023-12-11 NOTE — Progress Notes (Signed)
 Dr. Alfornia notified that new internal jugular line is actively leaking, and to advise.  IV team consult placed for assessment.  Will continue to monitor patient, and await any new orders.  Primary RN, Nubia, notified of communication/page/consult.

## 2023-12-11 NOTE — Progress Notes (Signed)
 PT Cancellation Note  Patient Details Name: Deborah Moses MRN: [id number] DOB: [birth year]   Cancelled Treatment:    Reason Eval/Treat Not Completed: Patient at procedure or test/unavailable (PT consult appreciated and chart reviewed. Pt off floor in US . Will follow-up as schedule permits.)  Randall SAUNDERS, PT, DPT Acute Rehabilitation Services Office: [phone] Secure Chat Preferred  [name] 12/11/2023, 8:42 AM

## 2023-12-11 NOTE — Progress Notes (Addendum)
 PROGRESS NOTE  Deborah Moses FMW:[id number] DOB: Jul 19, 1931 DOA: 12/10/2023 PCP: Orpha Yancey LABOR, MD  HPI/Recap of past 24 hours: Deborah Moses is a 89 y.o. 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 Reports that she was going out to check the mail and thinks she tripped and fell. Fell onto R side and hit her face. This is 2nd fall in the past month. Denies syncope.  Reports that she has had diarrhea, which she has intermittently and attributes to IBS symptoms. She also had decreased appetite prior to admission.  She typically gets up multiple times per night to urinate, but suddenly about 2 days ago has noticed a sharp dropoff in her urine output.   12/11/2023: The patient was seen and examined at bedside.  Her daughter was present in the room.  She has had minimal urine output overnight.  No complaints this morning.  Denies of any chest pain.  Nephrology following.  Currently on isotonic bicarb drip for high non anion gap metabolic acidosis, AKI, and hyperkalemia.  The patient is from home and lives alone.  Has had multiple falls in the past month.  Assessment/Plan: Principal Problem:   Renal failure  Acute renal failure, on CKD stage 4  Severe range hyperkalemia  Baseline Cr ~ 2.2 last in 3/'25 -> up to 7.4 on admission; Very slight improvement with initial IVF. Associated severe range hyperkalemia at 6.6 -> stable although unchanged at 6.6 after initiation of temporizing and eliminating treatments, uremia BUN 92 -> 87. Etiology suspect prerenal, possible conversion to intrinsic injury. There is no hydronephrosis on imaging.  Renal ultrasound was nonacute.  It showed renal atrophy. -- Nephrology following, Dr. Dennise.  Recommend trialing Lasix 100 mg IV in addition to IVF which was ordered.   -Currently on isotonic  bicarb drip and Lokelma. -Continue to hold home olmesartan -- Overall, she is poor dialysis candidate due to her comborbid conditions and limited prognosis in setting of her expanding aortic aneurysm with endoleak     Ground level fall  Large L maxillary hematoma  Reportedly mechanical fall. CT Head, C spine, Maxillofacial, C/A/P, CXR, Pelvic XR, R hand XR neg for significant traumatic injuries, there is a large hematoma over the L maxilla, ? Radioopaque debris around the R thumb   -- PT / OT evaluation  -- Orthostatic vital signs - Fall precautions   AHRF - 3L  Patchy GGO RUL, ? Interstitial edema, ? Pneumonia  ?? Developing volume overload  Desat to 85%, placed on 3L with recovery in sat. Imaging with findings per above.  -- Cautious fluid management per above, consider for diuretics / dialysis  -- Hold off on antibiotics for now.  -- Continue bronchodilator nebulizers.     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  CHB s/p PPM: Noted  Paroxysmal Afib: Reports that she was just diagnosed and prescribed for Digoxin although has not started taking this yet.; would avoid in setting of her renal failure. Recently Rx'd Apixaban , would hold on starting with her current condition  IDA: No recent baseline,  downtrending to now 8.1, close monitoring.  HTN: Hold her home Olmesartan. Continue Amlodipine , Metoprolol   Prediabetes: Diet controlled   Body mass index is 19.2 kg/m.  Underweight.   DVT prophylaxis:  SCDs Code Status:  DNR/DNI(Do NOT Intubate); confirmed with patient  Diet: Renal, modified diet.   Time: 50 minutes.    Code Status:  DNR.  Family Communication: Daughter at bedside.  Disposition Plan: Pending PT OT evaluation   Consultants: Nephrology.  Procedures: None.  Antimicrobials: None.  DVT prophylaxis: SCDs  Status is: Inpatient The patient requires at least 2 midnights for further evaluation and treatment of present condition.    Objective: Vitals:   12/11/23 0900 12/11/23 0917 12/11/23 1030 12/11/23 1123  BP: (!) 170/81 (!) 170/81 (!) 173/82   Pulse: 73 72 62   Resp: 16  13   Temp:    (!) 97.5 F (36.4 C)  TempSrc:    Oral  SpO2: 100%  100%   Weight:      Height:        Intake/Output Summary (Last 24 hours) at 12/11/2023 1324 Last data filed at 12/11/2023 1144 Gross per 24 hour  Intake 2716.89 ml  Output 20 ml  Net 2696.89 ml   Filed Weights   12/10/23 2015  Weight: 47.6 kg    Exam:  General: 89 y.o. year-old female well developed well nourished in no acute distress.  Alert and oriented x3. Cardiovascular: Regular rate and rhythm with no rubs or gallops.  No thyromegaly or JVD noted.   Respiratory: Clear to auscultation with no wheezes or rales. Good inspiratory effort. Abdomen: Soft nontender nondistended with normal bowel sounds x4 quadrants. Musculoskeletal: No lower extremity edema bilaterally. Skin: Facial bruising. Psychiatry: Mood is appropriate for condition and setting   Data Reviewed: CBC: Recent Labs  Lab 12/10/23 1940 12/10/23 1957 12/11/23 0310  WBC  --  9.6 8.8  NEUTROABS  --  7.2  --   HGB 9.9* 9.5* 8.1*  HCT 29.0* 29.9* 25.6*  MCV  --  94.3 94.1  PLT  --  323 263   Basic Metabolic Panel: Recent Labs  Lab 12/10/23 1940 12/10/23 1957 12/11/23 0310 12/11/23 0550 12/11/23 0952  NA 132* 133* 133*  134*  --   --   K 6.7* 6.6* 6.7*  6.6* 5.8* 5.8*  CL 109 104 105  105  --   --   CO2  --  14* 15*  15*  --   --   GLUCOSE 136* 136* 110*  113*  --   --   BUN 86* 92* 88*  87*  --   --   CREATININE 8.10* 7.40* 7.02*  7.04*  --   --    CALCIUM   --  10.3 9.9  9.8  --   --   MG  --   --  2.4  --   --   PHOS  --   --  4.1  4.1  --   --    GFR: Estimated Creatinine Clearance: 4 mL/min (A) (by C-G formula based on SCr of 7.04 mg/dL (H)). Liver Function Tests: Recent Labs  Lab 12/10/23 1957 12/11/23 0310  AST 27  --   ALT 22  --   ALKPHOS 112  --   BILITOT 0.5  --   PROT 6.5  --   ALBUMIN 3.1* 2.7*   No results for input(s): LIPASE, AMYLASE in the last 168 hours. No results for input(s): AMMONIA in the last 168 hours.  Coagulation Profile: No results for input(s): INR, PROTIME in the last 168 hours. Cardiac Enzymes: Recent Labs  Lab 12/10/23 1957 12/11/23 0310  CKTOTAL 58 56   BNP (last 3 results) No results for input(s): PROBNP in the last 8760 hours. HbA1C: No results for input(s): HGBA1C in the last 72 hours. CBG: Recent Labs  Lab 12/10/23 2138 12/11/23 0620  GLUCAP 149* 120*   Lipid Profile: No results for input(s): CHOL, HDL, LDLCALC, TRIG, CHOLHDL, LDLDIRECT in the last 72 hours. Thyroid Function Tests: No results for input(s): TSH, T4TOTAL, FREET4, T3FREE, THYROIDAB in the last 72 hours. Anemia Panel: No results for input(s): VITAMINB12, FOLATE, FERRITIN, TIBC, IRON, RETICCTPCT in the last 72 hours. Urine analysis:    Component Value Date/Time   COLORURINE AMBER (A) 12/11/2023 0303   APPEARANCEUR CLOUDY (A) 12/11/2023 0303   LABSPEC 1.019 12/11/2023 0303   PHURINE 5.0 12/11/2023 0303   GLUCOSEU NEGATIVE 12/11/2023 0303   HGBUR NEGATIVE 12/11/2023 0303   BILIRUBINUR NEGATIVE 12/11/2023 0303   KETONESUR NEGATIVE 12/11/2023 0303   PROTEINUR 30 (A) 12/11/2023 0303   NITRITE NEGATIVE 12/11/2023 0303   LEUKOCYTESUR MODERATE (A) 12/11/2023 0303   Sepsis Labs: @LABRCNTIP (procalcitonin:4,lacticidven:4)  )No results found for this or any previous visit (from the past 240 hours).    Studies: US  RENAL Result Date: 12/11/2023 EXAM: US   Retroperitoneum Complete, Renal. CLINICAL HISTORY: 89 year old female with acute kidney injury (AKI). TECHNIQUE: Real-time ultrasound of the retroperitoneum (complete) with image documentation. COMPARISON: Non-contrast CT chest, abdomen, and pelvis 12/10/2023. FINDINGS: RIGHT KIDNEY: Right kidney measures 6.4 x 2.7 x 3.2 cm, 29 ml. Renal cortical atrophy and increased cortical echogenicity, greater on this side. No hydronephrosis, renal stone, or solid renal mass visualized. LEFT KIDNEY: Left kidney measures 7.8 x 3.3 x 3.4 cm, 45 ml. Renal cortical atrophy and increased cortical echogenicity. No hydronephrosis, renal stone, or solid mass visualized. BLADDER: Urinary bladder decompressed with foley catheter balloon visible. PERIHEPATIC REGION: Trace perihepatic ascites suspected (series 1 image 30). IMPRESSION: 1. Renal atrophy. No acute findings. 2. Trace perihepatic ascites. Electronically signed by: Helayne Hurst MD 12/11/2023 09:12 AM EDT RP Workstation: [id number]   DG CHEST PORT 1 VIEW Result Date: 12/11/2023 EXAM: 1 VIEW XRAY OF THE CHEST 12/11/2023 06:02:07 AM COMPARISON: 12/10/2022 CLINICAL HISTORY: [id number] Acute hypoxic respiratory failure (HCC) [id number]. FINDINGS: LUNGS AND PLEURA: Bilateral pleural effusions. Interstitial edema. Persistent retrocardiac opacification in the left base, which may reflect atelectasis or airspace disease. No pneumothorax. HEART AND MEDIASTINUM: Stable left chest wall pacer. Aortic atherosclerosis unchanged. Appearance of thoracic aortic aneurysm unchanged. BONES AND SOFT TISSUES: No acute osseous abnormality. IMPRESSION: 1. Persistent retrocardiac opacification in the left base, possibly atelectasis or airspace disease. 2. No change in bilateral pleural effusions and interstitial edema. 3. Thoracic aortic aneurysm refer to CT chest report from 12/10/2023. Electronically signed by: Waddell Calk MD 12/11/2023 06:08 AM EDT RP Workstation: [id number]   CT CHEST ABDOMEN PELVIS  WO CONTRAST Result Date: 12/10/2023 EXAM: CT CHEST, ABDOMEN AND PELVIS WITHOUT CONTRAST 12/10/2023 07:59:53 PM TECHNIQUE: CT of the chest, abdomen and pelvis was performed without the administration of intravenous contrast. Multiplanar reformatted images are provided for review. Automated exposure control, iterative reconstruction, and/or weight based adjustment of the mA/kV was utilized to reduce the radiation dose to as low as reasonably achievable. COMPARISON: 11/09/2023. CLINICAL HISTORY: Fall FINDINGS: CHEST: MEDIASTINUM AND LYMPH NODES: Mild cardiomegaly. Moderate 3 vessel coronary artery disease. The central airways are clear. No mediastinal, hilar or axillary lymphadenopathy. Stable appearance of chronic dissection  of the thoracic aortic arch with aneurysmal dilatation measuring up to 5.7 cm (image 20) and hyperdense intramural hematoma extending along the descending thoracic aorta (image 26). Additional intramural hematoma just above the aortic hiatus (image 49), unchanged. 4.1 cm descending thoracic aortic aneurysm at the aortic hiatus, unchanged. LUNGS AND PLEURA: Small bilateral pleural effusions. No pneumothorax. Mild patchy bilateral lower lobe opacities, likely atelectasis. Mild patchy ground-glass opacities in the right upper lobe (image 51), possibly mild interstitial edema, although infection/pneumonia is not excluded. ABDOMEN AND PELVIS: LIVER: The liver is unremarkable. GALLBLADDER AND BILE DUCTS: Status post cholecystectomy. No biliary ductal dilatation. SPLEEN: No acute abnormality. PANCREAS: No acute abnormality. ADRENAL GLANDS: No acute abnormality. KIDNEYS, URETERS AND BLADDER: Two small hyperdense/hemorrhagic left renal cysts. Atrophic bilateral kidneys. No stones in the kidneys or ureters. No hydronephrosis. No perinephric or periureteral stranding. Urinary bladder is unremarkable. GI AND BOWEL: Stomach demonstrates no acute abnormality. Diffuse colonic diverticulosis. There is no bowel  obstruction. REPRODUCTIVE ORGANS: Status post hysterectomy. PERITONEUM AND RETROPERITONEUM: Trace simple free fluid within pelvis. No free air. VASCULATURE: Aortobiiliac stent graft with stable 5.9 cm infrarenal aortic aneurysm (image 66). ABDOMINAL AND PELVIS LYMPH NODES: No lymphadenopathy. BONES AND SOFT TISSUES: Left chest wall pacemaker noted. No acute osseous abnormality. No focal soft tissue abnormality. IMPRESSION: 1. No traumatic injury to the chest, abdomen, or pelvis. 2. Stable chronic dissection of the thoracic aortic arch with aneurysmal dilatation measuring up to 5.7 cm. Additional descending thoracic aortic findings as above. 3. Stable 5.9 cm infrarenal abdominal aortic aneurysm with aortobiiliac stent graft. 4. Mild patchy ground-glass opacities in the right upper lobe, possibly mild interstitial edema, although infection or pneumonia is not excluded. 5. Small bilateral pleural effusions. Electronically signed by: Pinkie Pebbles MD 12/10/2023 08:49 PM EDT RP Workstation: [id number]   CT CERVICAL SPINE WO CONTRAST Result Date: 12/10/2023 EXAM: CT CERVICAL SPINE WITHOUT CONTRAST 12/10/2023 07:59:53 PM TECHNIQUE: CT of the cervical spine was performed without the administration of intravenous contrast. Multiplanar reformatted images are provided for review. Automated exposure control, iterative reconstruction, and/or weight based adjustment of the mA/kV was utilized to reduce the radiation dose to as low as reasonably achievable. COMPARISON: None available. CLINICAL HISTORY: Polytrauma, blunt. Fall when getting mail. FINDINGS: BONES AND ALIGNMENT: No acute fracture or traumatic malalignment. DEGENERATIVE CHANGES: No significant degenerative changes. SOFT TISSUES: No prevertebral soft tissue swelling. IMPRESSION: 1. No acute abnormality of the cervical spine. Electronically signed by: Pinkie Pebbles MD 12/10/2023 08:38 PM EDT RP Workstation: [id number]   CT MAXILLOFACIAL WO CONTRAST Result  Date: 12/10/2023 EXAM: CT OF THE FACE WITHOUT CONTRAST 12/10/2023 07:59:53 PM TECHNIQUE: CT of the face was performed without the administration of intravenous contrast. Multiplanar reformatted images are provided for review. Automated exposure control, iterative reconstruction, and/or weight based adjustment of the mA/kV was utilized to reduce the radiation dose to as low as reasonably achievable. COMPARISON: None available. CLINICAL HISTORY: Facial trauma, blunt. Fall when getting mail. FINDINGS: FACIAL BONES: No acute facial fracture. No mandibular dislocation. No suspicious bone lesion. ORBITS: Globes are intact. No acute traumatic injury. No inflammatory change. SINUSES AND MASTOIDS: Chronic opacification of the right maxillary sinus. Mastoid air cells are clear. SOFT TISSUES: Large hematoma centered over the left maxilla (image 38). Associated with left facial soft tissue swelling extending from the frontal region to the left cheek. IMPRESSION: 1. No acute facial fracture. 2. Large hematoma centered over the left maxilla. 3. Associated left facial soft tissue swelling. Electronically signed by: Pinkie Pebbles MD 12/10/2023  08:37 PM EDT RP Workstation: [id number]   CT HEAD WO CONTRAST Result Date: 12/10/2023 EXAM: CT HEAD WITHOUT CONTRAST 12/10/2023 07:59:53 PM TECHNIQUE: CT of the head was performed without the administration of intravenous contrast. Automated exposure control, iterative reconstruction, and/or weight based adjustment of the mA/kV was utilized to reduce the radiation dose to as low as reasonably achievable. COMPARISON: None available. CLINICAL HISTORY: Head trauma, moderate-severe. Fall when getting mail. FINDINGS: BRAIN AND VENTRICLES: Global cortical atrophy. Subcortical and periventricular small vessel ischemic changes. Intracranial atherosclerosis. No acute hemorrhage. No evidence of acute infarct. No hydrocephalus. No extra-axial collection. No mass effect or midline shift. ORBITS:  No acute abnormality. SINUSES: No acute abnormality. SOFT TISSUES AND SKULL: No acute soft tissue abnormality. No skull fracture. Additional maxillofacial findings reported on dedicated CT. IMPRESSION: 1. No acute intracranial abnormality. 2. Additional maxillofacial findings reported on dedicated CT. Electronically signed by: Pinkie Pebbles MD 12/10/2023 08:33 PM EDT RP Workstation: [id number]   DG Hand Complete Right Result Date: 12/10/2023 CLINICAL DATA:  Status post fall with laceration to the thumb. EXAM: DG HAND COMPLETE 3+V*R* COMPARISON:  None Available. FINDINGS: There is no evidence of fracture or dislocation. Soft tissue thickening with skin irregularity about the thumb at the level of the interphalangeal joint with suspected punctate radiopaque debris. Chronic arthropathy of the digits and thumb carpal metacarpal joint. The distal interphalangeal joint of the third digit has a Gull wing deformity. Chondrocalcinosis of the triangular fibrocartilage and proximal carpal row. IMPRESSION: 1. No acute fracture or dislocation. 2. Soft tissue thickening with skin irregularity about the thumb at the level of the interphalangeal joint with suspected punctate radiopaque debris. 3. Chronic arthropathy of the digits and thumb carpometacarpal joint. Gull wing deformity of the distal interphalangeal joint of the third digit, can be seen with erosive osteoarthritis. Electronically Signed   By: Andrea Gasman M.D.   On: 12/10/2023 20:16   DG Pelvis Portable Result Date: 12/10/2023 CLINICAL DATA:  Blunt trauma. EXAM: PORTABLE PELVIS 1-2 VIEWS COMPARISON:  None Available. FINDINGS: The bones are under mineralized. The cortical margins of the bony pelvis are intact. No fracture. Pubic symphysis and sacroiliac joints are congruent. Both femoral heads are well-seated in the respective acetabula. Mild bilateral hip osteoarthritis. Aorto bi-iliac stent graft. Peripheral vascular calcifications. IMPRESSION: 1. No  fracture of the pelvis. 2. Mild bilateral hip osteoarthritis. Electronically Signed   By: Andrea Gasman M.D.   On: 12/10/2023 20:11   DG Chest Port 1 View Result Date: 12/10/2023 EXAM: 1 VIEW(S) XRAY OF THE CHEST 12/10/2023 07:52:00 PM COMPARISON: Chest x-ray 6621. CT chest abdomen and pelvis9/24/2025. CLINICAL HISTORY: Trauma. Reason for exam: Trauma /FOT; Laceration on right thumb. FINDINGS: LUNGS AND PLEURA: There are patchy airspace opacities in the left lung base. Small left pleural effusion. No pulmonary edema. No pneumothorax. HEART AND MEDIASTINUM: Left-sided pacemaker is present. The aortic arch appears dilated and calcified similar to prior study. Aorta is heavily calcified. Heart is mildly enlarged. BONES AND SOFT TISSUES: No acute osseous abnormality. IMPRESSION: 1. Patchy airspace opacities in the left lung base with small left pleural effusion. 2. Grossly unchanged dilated aorta. If there is high clinical concern for acute aortic pathology, recommend follow-up CT. Electronically signed by: Greig Pique MD 12/10/2023 08:10 PM EDT RP Workstation: [id number]    Scheduled Meds:  albuterol  2.5 mg Nebulization Q4H   amLODipine   5 mg Oral Daily   Chlorhexidine  Gluconate Cloth  6 each Topical Daily   metoprolol  succinate  50 mg Oral  Daily   sodium zirconium cyclosilicate  10 g Oral TID    Continuous Infusions:  calcium  gluconate     furosemide     sodium bicarbonate 150 mEq in dextrose  5 % 1,150 mL infusion 100 mL/hr at 12/11/23 1231     LOS: 1 day     Terry LOISE Hurst, MD [facility] Pager [phone]  If 7PM-7AM, please contact night-coverage [email or web address] Password [email or web address] 12/11/2023, 1:24 PM

## 2023-12-11 NOTE — Progress Notes (Signed)
 Dr sanford contacted, I was instructed to give 25g albumin and see if that would resolve the tachycardia.

## 2023-12-11 NOTE — ED Notes (Signed)
 RN notified pharmacy if IV infiltration. Pt denies any pain and arm is clean, dry, and intact. IV removed.

## 2023-12-11 NOTE — Progress Notes (Signed)
 Followed up with patient, daughter. 2 nieces in room. We discussed her most recent labs, particularly her K back up to 6.1. HyperK failed med mgmt. She is also sat'ing 87-90% on RA now.  We had a rather difficult conversation in regards as to what comes next. Discussed NOT doing dialysis and what that would mean in regards to mortality. We also discussed doing one session for HD and holding further treatments so family can have time to come in.  --Patient and family have decided to move forward with dialysis for today to buy her more time until family comes in. I did recommend that we do NOT pursue further dialysis treatments thereafter. They do understand that she is not a long term candidate for dialysis. More family coming in tomorrow. I am anticipating transition to comfort care which I explained in detail. Will consult palliative care preemptively. --Will plan for HD today, discussed with [facility] RN. First hour 1K followed by 2K. She is more hypervolemic as compared to when I saw her a few hours ago, will plan to UF 1.5-2L as tolerated --CCM consulted for temp line placement --Please call on-call nephrology with any questions/concerns  Ephriam Stank, MD [facility]

## 2023-12-11 NOTE — ED Notes (Signed)
 Asked patient if she was able to produce urine. Pt states she only urinates 1-2x a day

## 2023-12-11 NOTE — ED Notes (Signed)
 Unable to stand pt for orthostatic vital signs

## 2023-12-11 NOTE — Evaluation (Signed)
 Physical Therapy Evaluation Patient Details Name: Deborah Moses MRN: [id number] DOB: 07/20/1931 Today's Date: 12/11/2023  History of Present Illness  Deborah Moses is a 89 y.o. female who presented to [facility] ED 12/10/23 after ground level fall outside d/t tripping and hitting her right side and face. Work-up found renal failure and severe range hyperkalemia. CT Head, C spine, Maxillofacial, C/A/P, CXR, Pelvic XR, R hand XR negative for significant traumatic injuries, there is a large hematoma over the L maxilla and ? radioopaque debris around the R thumb. 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   Clinical Impression  Pt admitted with above diagnosis. PTA, pt was modI for functional mobility using SPC and independent with ADLs/IADLs. She lives alone in a one story house with 3 STE. Pt currently with functional limitations due to the deficits listed below (see PT Problem List). She required mod-maxA for bed mobility and min-modA for sit<>stand. Pt is currently limited by impaired balance, generalized BLE weakness, and decreased activity tolerance. Pt will benefit from acute skilled PT to increase her independence and safety with mobility to allow discharge. Recommend continued inpatient follow up therapy, <3 hours/day.    If plan is discharge home, recommend the following: A lot of help with walking and/or transfers;A lot of help with bathing/dressing/bathroom;Assistance with cooking/housework;Assist for transportation;Help with stairs or ramp for entrance   Can travel by private vehicle   No    Equipment Recommendations Wheelchair (measurements PT);Wheelchair cushion (measurements PT);BSC/3in1  Recommendations for Other Services       Functional Status Assessment Patient has had a recent decline in their functional status and demonstrates the ability to make significant  improvements in function in a reasonable and predictable amount of time.     Precautions / Restrictions Precautions Precautions: Fall Recall of Precautions/Restrictions: Impaired Restrictions Weight Bearing Restrictions Per Provider Order: No      Mobility  Bed Mobility Overal bed mobility: Needs Assistance Bed Mobility: Supine to Sit, Sit to Supine     Supine to sit: Mod assist, HOB elevated Sit to supine: Max assist   General bed mobility comments: Pt sat up on R side of bed with increased time. Assist to bring BLE off EOB, elevate trunk, and scoot hips fwd. Returning to bed pt required assist to bring BLE back into bed and lower trunk down. Repositioned using bed features and +2 assist.    Transfers Overall transfer level: Needs assistance Equipment used: None Transfers: Sit to/from Stand Sit to Stand: Min assist, Mod assist           General transfer comment: Pt stood from stretcher. She held onto posterior upper arms of PT. Required min-modA to power up and modA to maintain static stance d/t posterior lean. She quickly fatigued in standing.    Ambulation/Gait               General Gait Details: Unable  Stairs            Wheelchair Mobility     Tilt Bed    Modified Rankin (Stroke Patients Only)       Balance Overall balance assessment: Needs assistance, History of Falls Sitting-balance support: Bilateral upper extremity supported, Feet supported Sitting balance-Leahy Scale: Fair Sitting balance - Comments: Pt sat EOB with CGA. Postural control: Posterior lean Standing balance support: Bilateral upper extremity supported, During functional activity Standing balance-Leahy Scale: Poor Standing balance comment: Pt dependent on  external support of therapist. She demonstrated a posterior bias requiring modA to stabilize.                             Pertinent Vitals/Pain Pain Assessment Pain Assessment: No/denies pain    Home  Living Family/patient expects to be discharged to:: Private residence Living Arrangements: Alone Available Help at Discharge: Family;Available PRN/intermittently (Daughter is 15-73mins away) Type of Home: House Home Access: Stairs to enter Entrance Stairs-Rails: Left Entrance Stairs-Number of Steps: 3   Home Layout: One level Home Equipment: Hand held shower head;Grab bars - tub/shower;Grab bars - toilet;Cane - single point;Rolling Walker (2 wheels)      Prior Function Prior Level of Function : Independent/Modified Independent             Mobility Comments: Ambulates using SPC. 2 falls in under 2-3 months. ADLs Comments: Indep with ADLs. Doesn't drive. Manages her own medicaitons. Cooks/clean. Pt states she mainly microwaves meals.     Extremity/Trunk Assessment   Upper Extremity Assessment Upper Extremity Assessment: Defer to OT evaluation    Lower Extremity Assessment Lower Extremity Assessment: Generalized weakness    Cervical / Trunk Assessment Cervical / Trunk Assessment: Kyphotic  Communication   Communication Communication: Impaired Factors Affecting Communication: Hearing impaired    Cognition Arousal: Alert Behavior During Therapy: WFL for tasks assessed/performed   PT - Cognitive impairments: No apparent impairments                       PT - Cognition Comments: Pt A,Ox4 Following commands: Intact       Cueing Cueing Techniques: Verbal cues, Gestural cues     General Comments General comments (skin integrity, edema, etc.): VSS on RA. Pt greeted on 2L O2 via [place], weaned to RA with SpO2 89-93%. Reapplied 2L at RN's request. Orthostatics taken and were negative, unable to tolerate standing for 3 mins. Daughter present and supportive throughout session.    Exercises     Assessment/Plan    PT Assessment Patient needs continued PT services  PT Problem List Decreased strength;Decreased range of motion;Decreased activity tolerance;Decreased  balance;Decreased mobility;Decreased knowledge of use of DME;Decreased safety awareness       PT Treatment Interventions DME instruction;Gait training;Stair training;Functional mobility training;Therapeutic activities;Therapeutic exercise;Balance training;Cognitive remediation;Patient/family education    PT Goals (Current goals can be found in the Care Plan section)  Acute Rehab PT Goals Patient Stated Goal: Return Home PT Goal Formulation: With patient/family Time For Goal Achievement: 12/25/23 Potential to Achieve Goals: Fair    Frequency Min 2X/week     Co-evaluation               AM-PAC PT 6 Clicks Mobility  Outcome Measure Help needed turning from your back to your side while in a flat bed without using bedrails?: A Little Help needed moving from lying on your back to sitting on the side of a flat bed without using bedrails?: A Little Help needed moving to and from a bed to a chair (including a wheelchair)?: A Lot Help needed standing up from a chair using your arms (e.g., wheelchair or bedside chair)?: A Lot Help needed to walk in hospital room?: Total Help needed climbing 3-5 steps with a railing? : Total 6 Click Score: 12    End of Session Equipment Utilized During Treatment: Gait belt Activity Tolerance: Patient tolerated treatment well Patient left: in bed;with call bell/phone within reach;with family/visitor present Nurse  Communication: Mobility status PT Visit Diagnosis: Difficulty in walking, not elsewhere classified (R26.2)    Time: 8862-8844 PT Time Calculation (min) (ACUTE ONLY): 18 min   Charges:   PT Evaluation $PT Eval Moderate Complexity: 1 Mod   PT General Charges $$ ACUTE PT VISIT: 1 Visit         Randall SAUNDERS, PT, DPT Acute Rehabilitation Services Office: [phone] Secure Chat Preferred  [name] 12/11/2023, 1:30 PM

## 2023-12-11 NOTE — H&P (Addendum)
 History and Physical    Di Jasmer FMW:[id number] DOB: 12-05-1931 DOA: 12/10/2023  PCP: Orpha Yancey LABOR, MD   Patient coming from: Home   Chief Complaint:  Chief Complaint  Patient presents with   Fall    HPI:  Deborah Moses is a 89 y.o. 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 Reports that she was going out to check the mail and thinks she tripped and fell. Fell onto R side and hit her face. This is 2nd fall in the past month. Denies syncope. She has pain mainly in the left face where she has significant hematoma and bruising. She has a mild headache. Reports that earlier today she had diarrhea, which she has intermittently, attributes to IBS symptoms. She had decreased appetite today. However prior to this reports normal PO intake. She typically gets up multiple times per night to urinate, but suddenly about 2 days ago has noticed a sharp dropoff in her urine output.    Review of Systems:  ROS complete and negative except as marked above   No Known Allergies  Prior to Admission medications   Medication Sig Start Date End Date Taking? Authorizing Provider  acetaminophen  (TYLENOL ) 500 MG tablet Take 500 mg by mouth every 6 (six) hours as needed for moderate pain.    Yes [provider]  alendronate (FOSAMAX) 70 MG tablet Take 70 mg by mouth every Monday. Take with a full glass of water  on an empty stomach.   Yes [provider]  amLODipine  (NORVASC ) 5 MG tablet Take 5 mg by mouth daily.   Yes [provider]  apixaban  (ELIQUIS ) 2.5 MG TABS tablet Take 1 tablet (2.5 mg total) by mouth 2 (two) times daily. 04/15/23  Yes Waddell Danelle ORN, MD  calcium -vitamin D  (OSCAL WITH D) 500-200 MG-UNIT TABS tablet Take 1 tablet by mouth daily.    Yes [provider]  famotidine  (PEPCID) 20 MG tablet Take 20 mg by mouth daily. Patient taking differently: Take 20 mg by mouth as needed.   Yes [provider]  Ferrous Sulfate (IRON PO) Take by mouth. Patient taking differently: Take by mouth 2 (two) times daily.   Yes [provider]  lisinopril  (PRINIVIL ,ZESTRIL ) 20 MG tablet Take 40 mg by mouth daily.    Yes [provider]  lovastatin (MEVACOR) 20 MG tablet Take 20 mg by mouth at bedtime.   Yes [provider]  metoprolol  succinate (TOPROL -XL) 50 MG 24 hr tablet Take 1 tablet (50 mg total) by mouth daily. 09/26/20  Yes Lesia Ozell Barter, PA-C  olmesartan (BENICAR) 20 MG tablet Take 20 mg by mouth daily. 08/30/22  Yes [provider]  Omega-3 Fatty Acids (FISH OIL) 1000 MG CPDR Take 1,000 mg by mouth in the morning, at noon, and at bedtime.  Patient taking differently: Take 1,000 mg by mouth 2 (two) times daily.   Yes [provider]  Turmeric (QC TUMERIC COMPLEX PO) Take 5 mg by mouth daily. Tumeric-curcumin caps 06-998 mg   Yes [provider]  digoxin (LANOXIN) 0.125 MG tablet Take 1 tablet (0.125 mg total) by mouth as directed. Take one tablet by mouth Monday, Tuesday, Wednesday, Thursday and Friday.  Do NOT take on Saturday or Sunday. Patient not taking: Reported on 12/11/2023 12/09/23   Waddell Danelle ORN, MD    Past  Medical History:  Diagnosis Date   AAA (abdominal aortic aneurysm)    Heart block AV third degree (HCC)    HTN (hypertension)    Hyperlipidemia    Vertigo     Past Surgical History:  Procedure Laterality Date   ABDOMINAL AORTIC ENDOVASCULAR STENT GRAFT N/A 08/01/2019   Procedure: ABDOMINAL AORTIC ENDOVASCULAR STENT GRAFT;  Surgeon: Serene Gaile ORN, MD;  Location: [facility] OR;  Service: Vascular;  Laterality: N/A;   APPENDECTOMY     per pt she might've gotten her appendix and gallbladder out together because that was standard procedure back then   CATARACT EXTRACTION W/PHACO Left  02/22/2022   Procedure: CATARACT EXTRACTION PHACO AND INTRAOCULAR LENS PLACEMENT (IOC);  Surgeon: Harrie Agent, MD;  Location: [facility];  Service: Ophthalmology;  Laterality: Left;  CDE: 25.37   CATARACT EXTRACTION W/PHACO Right 06/07/2022   Procedure: CATARACT EXTRACTION PHACO AND INTRAOCULAR LENS PLACEMENT (IOC);  Surgeon: Harrie Agent, MD;  Location: [facility];  Service: Ophthalmology;  Laterality: Right;  CDE: 22.88   CHOLECYSTECTOMY     PACEMAKER IMPLANT N/A 07/12/2017   Procedure: PACEMAKER IMPLANT;  Surgeon: Waddell Danelle ORN, MD;  Location: [facility]ASIVE [facility];  Service: Cardiovascular;  Laterality: N/A;   TONSILLECTOMY     ULTRASOUND GUIDANCE FOR VASCULAR ACCESS N/A 08/01/2019   Procedure: ULTRASOUND GUIDANCE FOR VASCULAR ACCESS;  Surgeon: Serene Gaile ORN, MD;  Location: [facility] OR;  Service: Vascular;  Laterality: N/A;     reports that she has never smoked. She has never used smokeless tobacco. She reports that she does not drink alcohol and does not use drugs.  Family History  Problem Relation Age of Onset   Heart failure Father      Physical Exam: Vitals:   12/10/23 2315 12/11/23 0010 12/11/23 0300 12/11/23 0337  BP: (!) 173/77  (!) 178/78   Pulse: 67  67   Resp: 16  13   Temp:  97.8 F (36.6 C)    TempSrc:  Axillary    SpO2: 96%  93% 100%  Weight:      Height:        Gen: Awake, alert, chronically ill appearing  HEENT: there is hematoma and signifcant bruising over the maxilla and bruising moving in dependent fashion.  CV: Regular, normal S1, S2, 3/6 SEM  Resp: Normal WOB, There are bibasilar rales R > L.  Abd: Flat, normoactive, nontender MSK: Thumb in a splint. No snuffbox tenderness. Symmetric, no edema  Skin: See HEENT. Otherwise with bruising over the R thumb and hand.  Neuro: Alert and interactive  Psych: euthymic, appropriate    Data review:   Labs reviewed, notable for:   Cr (istat) 8.1 -> bmp 7.4 -> 7  K 6.7 -> 6.6 -> 6.6  Bicarb 15, AG 14  BUN 92 -> 87    Hb 8.1, MCV 94     Micro:  Results for orders placed or performed during the hospital encounter of 07/30/19  Surgical pcr screen     Status: None   Collection Time: 07/30/19  9:50 AM   Specimen: Nasal Mucosa; Nasal Swab  Result Value Ref Range Status   MRSA, PCR NEGATIVE NEGATIVE Final   Staphylococcus aureus NEGATIVE NEGATIVE Final    Comment: (NOTE) The Xpert SA Assay (FDA approved for NASAL specimens in patients 21 years of age and older), is one component of a comprehensive surveillance program. It is not intended to diagnose infection nor to guide or monitor treatment. Performed at [facility]  Lab, [address], [place], [place] [address]     Imaging reviewed:  CT CHEST ABDOMEN PELVIS WO CONTRAST Result Date: 12/10/2023 EXAM: CT CHEST, ABDOMEN AND PELVIS WITHOUT CONTRAST 12/10/2023 07:59:53 PM TECHNIQUE: CT of the chest, abdomen and pelvis was performed without the administration of intravenous contrast. Multiplanar reformatted images are provided for review. Automated exposure control, iterative reconstruction, and/or weight based adjustment of the mA/kV was utilized to reduce the radiation dose to as low as reasonably achievable. COMPARISON: 11/09/2023. CLINICAL HISTORY: Fall FINDINGS: CHEST: MEDIASTINUM AND LYMPH NODES: Mild cardiomegaly. Moderate 3 vessel coronary artery disease. The central airways are clear. No mediastinal, hilar or axillary lymphadenopathy. Stable appearance of chronic dissection of the thoracic aortic arch with aneurysmal dilatation measuring up to 5.7 cm (image 20) and hyperdense intramural hematoma extending along the descending thoracic aorta (image 26). Additional intramural hematoma just above the aortic hiatus (image 49), unchanged. 4.1 cm descending thoracic aortic aneurysm at the aortic hiatus, unchanged. LUNGS AND PLEURA: Small bilateral pleural effusions. No pneumothorax. Mild patchy bilateral lower lobe opacities, likely atelectasis. Mild  patchy ground-glass opacities in the right upper lobe (image 51), possibly mild interstitial edema, although infection/pneumonia is not excluded. ABDOMEN AND PELVIS: LIVER: The liver is unremarkable. GALLBLADDER AND BILE DUCTS: Status post cholecystectomy. No biliary ductal dilatation. SPLEEN: No acute abnormality. PANCREAS: No acute abnormality. ADRENAL GLANDS: No acute abnormality. KIDNEYS, URETERS AND BLADDER: Two small hyperdense/hemorrhagic left renal cysts. Atrophic bilateral kidneys. No stones in the kidneys or ureters. No hydronephrosis. No perinephric or periureteral stranding. Urinary bladder is unremarkable. GI AND BOWEL: Stomach demonstrates no acute abnormality. Diffuse colonic diverticulosis. There is no bowel obstruction. REPRODUCTIVE ORGANS: Status post hysterectomy. PERITONEUM AND RETROPERITONEUM: Trace simple free fluid within pelvis. No free air. VASCULATURE: Aortobiiliac stent graft with stable 5.9 cm infrarenal aortic aneurysm (image 66). ABDOMINAL AND PELVIS LYMPH NODES: No lymphadenopathy. BONES AND SOFT TISSUES: Left chest wall pacemaker noted. No acute osseous abnormality. No focal soft tissue abnormality. IMPRESSION: 1. No traumatic injury to the chest, abdomen, or pelvis. 2. Stable chronic dissection of the thoracic aortic arch with aneurysmal dilatation measuring up to 5.7 cm. Additional descending thoracic aortic findings as above. 3. Stable 5.9 cm infrarenal abdominal aortic aneurysm with aortobiiliac stent graft. 4. Mild patchy ground-glass opacities in the right upper lobe, possibly mild interstitial edema, although infection or pneumonia is not excluded. 5. Small bilateral pleural effusions. Electronically signed by: Pinkie Pebbles MD 12/10/2023 08:49 PM EDT RP Workstation: [id number]   CT CERVICAL SPINE WO CONTRAST Result Date: 12/10/2023 EXAM: CT CERVICAL SPINE WITHOUT CONTRAST 12/10/2023 07:59:53 PM TECHNIQUE: CT of the cervical spine was performed without the administration  of intravenous contrast. Multiplanar reformatted images are provided for review. Automated exposure control, iterative reconstruction, and/or weight based adjustment of the mA/kV was utilized to reduce the radiation dose to as low as reasonably achievable. COMPARISON: None available. CLINICAL HISTORY: Polytrauma, blunt. Fall when getting mail. FINDINGS: BONES AND ALIGNMENT: No acute fracture or traumatic malalignment. DEGENERATIVE CHANGES: No significant degenerative changes. SOFT TISSUES: No prevertebral soft tissue swelling. IMPRESSION: 1. No acute abnormality of the cervical spine. Electronically signed by: Pinkie Pebbles MD 12/10/2023 08:38 PM EDT RP Workstation: [id number]   CT MAXILLOFACIAL WO CONTRAST Result Date: 12/10/2023 EXAM: CT OF THE FACE WITHOUT CONTRAST 12/10/2023 07:59:53 PM TECHNIQUE: CT of the face was performed without the administration of intravenous contrast. Multiplanar reformatted images are provided for review. Automated exposure control, iterative reconstruction, and/or weight based adjustment of the mA/kV was utilized  to reduce the radiation dose to as low as reasonably achievable. COMPARISON: None available. CLINICAL HISTORY: Facial trauma, blunt. Fall when getting mail. FINDINGS: FACIAL BONES: No acute facial fracture. No mandibular dislocation. No suspicious bone lesion. ORBITS: Globes are intact. No acute traumatic injury. No inflammatory change. SINUSES AND MASTOIDS: Chronic opacification of the right maxillary sinus. Mastoid air cells are clear. SOFT TISSUES: Large hematoma centered over the left maxilla (image 38). Associated with left facial soft tissue swelling extending from the frontal region to the left cheek. IMPRESSION: 1. No acute facial fracture. 2. Large hematoma centered over the left maxilla. 3. Associated left facial soft tissue swelling. Electronically signed by: Pinkie Pebbles MD 12/10/2023 08:37 PM EDT RP Workstation: [id number]   CT HEAD WO  CONTRAST Result Date: 12/10/2023 EXAM: CT HEAD WITHOUT CONTRAST 12/10/2023 07:59:53 PM TECHNIQUE: CT of the head was performed without the administration of intravenous contrast. Automated exposure control, iterative reconstruction, and/or weight based adjustment of the mA/kV was utilized to reduce the radiation dose to as low as reasonably achievable. COMPARISON: None available. CLINICAL HISTORY: Head trauma, moderate-severe. Fall when getting mail. FINDINGS: BRAIN AND VENTRICLES: Global cortical atrophy. Subcortical and periventricular small vessel ischemic changes. Intracranial atherosclerosis. No acute hemorrhage. No evidence of acute infarct. No hydrocephalus. No extra-axial collection. No mass effect or midline shift. ORBITS: No acute abnormality. SINUSES: No acute abnormality. SOFT TISSUES AND SKULL: No acute soft tissue abnormality. No skull fracture. Additional maxillofacial findings reported on dedicated CT. IMPRESSION: 1. No acute intracranial abnormality. 2. Additional maxillofacial findings reported on dedicated CT. Electronically signed by: Pinkie Pebbles MD 12/10/2023 08:33 PM EDT RP Workstation: [id number]   DG Hand Complete Right Result Date: 12/10/2023 CLINICAL DATA:  Status post fall with laceration to the thumb. EXAM: DG HAND COMPLETE 3+V*R* COMPARISON:  None Available. FINDINGS: There is no evidence of fracture or dislocation. Soft tissue thickening with skin irregularity about the thumb at the level of the interphalangeal joint with suspected punctate radiopaque debris. Chronic arthropathy of the digits and thumb carpal metacarpal joint. The distal interphalangeal joint of the third digit has a Gull wing deformity. Chondrocalcinosis of the triangular fibrocartilage and proximal carpal row. IMPRESSION: 1. No acute fracture or dislocation. 2. Soft tissue thickening with skin irregularity about the thumb at the level of the interphalangeal joint with suspected punctate radiopaque debris.  3. Chronic arthropathy of the digits and thumb carpometacarpal joint. Gull wing deformity of the distal interphalangeal joint of the third digit, can be seen with erosive osteoarthritis. Electronically Signed   By: Andrea Gasman M.D.   On: 12/10/2023 20:16   DG Pelvis Portable Result Date: 12/10/2023 CLINICAL DATA:  Blunt trauma. EXAM: PORTABLE PELVIS 1-2 VIEWS COMPARISON:  None Available. FINDINGS: The bones are under mineralized. The cortical margins of the bony pelvis are intact. No fracture. Pubic symphysis and sacroiliac joints are congruent. Both femoral heads are well-seated in the respective acetabula. Mild bilateral hip osteoarthritis. Aorto bi-iliac stent graft. Peripheral vascular calcifications. IMPRESSION: 1. No fracture of the pelvis. 2. Mild bilateral hip osteoarthritis. Electronically Signed   By: Andrea Gasman M.D.   On: 12/10/2023 20:11   DG Chest Port 1 View Result Date: 12/10/2023 EXAM: 1 VIEW(S) XRAY OF THE CHEST 12/10/2023 07:52:00 PM COMPARISON: Chest x-ray 6621. CT chest abdomen and pelvis9/24/2025. CLINICAL HISTORY: Trauma. Reason for exam: Trauma /FOT; Laceration on right thumb. FINDINGS: LUNGS AND PLEURA: There are patchy airspace opacities in the left lung base. Small left pleural effusion. No pulmonary edema. No pneumothorax.  HEART AND MEDIASTINUM: Left-sided pacemaker is present. The aortic arch appears dilated and calcified similar to prior study. Aorta is heavily calcified. Heart is mildly enlarged. BONES AND SOFT TISSUES: No acute osseous abnormality. IMPRESSION: 1. Patchy airspace opacities in the left lung base with small left pleural effusion. 2. Grossly unchanged dilated aorta. If there is high clinical concern for acute aortic pathology, recommend follow-up CT. Electronically signed by: Greig Pique MD 12/10/2023 08:10 PM EDT RP Workstation: [id number]    EKG:  Personally reviewed, sinus rhythm, LAE, LBBB, prolonged QTc not corrected for LBBB, no overt ischemic  changes.  No peaked T waves  ED Course:  Arrived as a trauma alert, imaging negative for significant injuries.  Found to have renal failure and severe range hyperkalemia, was treated with insulin and D50, Lokelma, calcium  gluconate, 1 L of LR.   Assessment/Plan:  89 y.o. 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 Admitted with acute renal failure, and associated severe range hyperkalemia   Acute renal failure,  CKD stage ?4  Severe range hyperkalemia  Baseline Cr ~ 2.2 last in 3/'25 -> up to 7.4 on admission; Very slight improvement with initial IVF. Associated severe range hyperkalemia at 6.6 -> stable although unchanged at 6.6 after initiation of temporizing and eliminating treatments, uremia BUN 92 -> 87. Etiology suspect prerenal, possible conversion to intrinsic injury. There is no hydronephrosis on imaging.  -- Spoke with nephrology Dr. Dennise given her limited improvement, anuria since being in ED, they will evaluate her this morning. Recommend trialing Lasix 100 mg IV in addition to IVF which was ordered.   -- continue mIVF NS at 100 cc/hr,   -- Continue Lokelma 10 g TID x 48 hr then daily for now until improved  -- Repeat temporization as needed, giving repeat insulin D50 now, K r/pt at 6 AM, 10 AM.  -- Start on sodium bicarb 650 mg BID for now  -- Place on purewick, strict intake and output  -- Cath for UA, eval casts.  -- Hold her Olmesartan + note that her med list also has Lisinopril  in addition with last dose yesterday -- Overall, she is poor dialysis candidate due to her comborbid conditions and limited prognosis in setting of her expanding aortic aneurysm with endoleak    Ground level fall  Large L maxillary hematoma  Reportedly mechanical fall. CT Head, C spine, Maxillofacial, C/A/P, CXR, Pelvic XR, R  hand XR neg for significant traumatic injuries, there is a large hematoma over the L maxilla, ? Radioopaque debris around the R thumb   -- PT / OT evaluation  -- Orthostatics in AM   AHRF - 3L  Patchy GGO RUL, ? Interstitial edema, ? Pneumonia  ?? Developing volume overload  Desat to 85%, placed on 3L with recovery in sat. Imaging with findings per above.  -- Cautious fluid management per above, consider for diuretics / dialysis  -- Hold off on antibiotics for now.  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  CHB s/p PPM: Noted  ?Paroxysmal Afib: Reports that she was just diagnosed and prescribed for Digoxin although has not started taking this yet.; would avoid in setting of her renal failure. Recently Rx'd Apixaban , would hold on starting with her current condition  IDA: No recent baseline, downtrending to now 8.1, close monitoring.  HTN: Hold her home Olmesartan. Continue Amlodipine , Metoprolol    Body mass index is 19.2 kg/m. Underweight    DVT prophylaxis:  SCDs Code Status:  DNR/DNI(Do NOT Intubate); confirmed with patient  Diet:  Diet Orders (From admission, onward)     Start     Ordered   12/10/23 1936  Diet NPO time specified  Diet effective now        12/10/23 1940           Family Communication:  Yes discussed with daughter at the bedside   Consults:  Nephrology    Admission status:   Inpatient, Step Down Unit  Severity of Illness: The appropriate patient status for this patient is INPATIENT. Inpatient status is judged to be reasonable and necessary in order to provide the  required intensity of service to ensure the patient's safety. The patient's presenting symptoms, physical exam findings, and initial radiographic and laboratory data in the context of their chronic comorbidities is felt to place them at high risk for further clinical deterioration. Furthermore, it is not anticipated that the patient will be medically stable for discharge from the hospital within 2 midnights of admission.   * I certify that at the point of admission it is my clinical judgment that the patient will require inpatient hospital care spanning beyond 2 midnights from the point of admission due to high intensity of service, high risk for further deterioration and high frequency of surveillance required.*   Dorn Dawson, MD [facility]  How to contact the [facility] Attending or Consulting provider 7A - 7P or covering provider during after hours 7P -7A, for this patient.  Check the care team in [facility] and look for a) attending/consulting [facility] provider listed and b) the [facility] team listed Log into [email or web address] and use [facility]'s universal password to access. If you do not have the password, please contact the hospital operator. Locate the [facility] provider you are looking for under [facility] and page to a number that you can be directly reached. If you still have difficulty reaching the provider, please page the [facility] (Director on Call) for the Hospitalists listed on amion for assistance.  12/11/2023, 3:43 AM

## 2023-12-11 NOTE — ED Notes (Signed)
 Pt was bladder scanned and no urine was noted

## 2023-12-11 NOTE — ED Notes (Signed)
 Patient transported to Ultrasound

## 2023-12-11 NOTE — Progress Notes (Signed)
 Sbp dropped into the 80s, uf turned off and 100ml saline bolus given)

## 2023-12-11 NOTE — Consult Note (Signed)
 Nephrology Consult   Requesting provider: Dr. Keturah Service requesting consult: TRH Reason for consult: AKI, hyperkalemia  Assessment/Recommendations:   AKI on CKD4, oligoanuric -followed by Dr. Macel OP -suspect AKI to be pre-renal in etiology (+hyaline casts). Hold home ARB. Renal u/s without obstruction, +renal atrophy. Recommend treatment for UTI given urine sediment findings -would not recommend dialysis given advanced age and underlying comorbidities (expanding aneurysm with endoleak). Had a very frank discussion with patient and daughter about our options. Indications for renal replacement therapy at this junction would be her hyperkalemia if not able to medically manage which it seems to be the case currently. They are going to think about this. Will repeat labs around 2pm. Ideally I don't think we should pursue a trial/limited run of HD -c/w supportive care: fluids, changing to bicarb gtt. HyperK management as below -Avoid nephrotoxic medications including NSAIDs and iodinated intravenous contrast exposure unless the latter is absolutely indicated.  Preferred narcotic agents for pain control are hydromorphone , fentanyl , and methadone. Morphine should not be used. Avoid Baclofen and avoid oral sodium phosphate  and magnesium  citrate based laxatives / bowel preps. Continue strict Input and Output monitoring. Will monitor the patient closely with you and intervene or adjust therapy as indicated by changes in clinical status/labs   Hyperkalemia -c/w med mgmt. Discussed with Dr. Keturah early AM-no EKG changes. Also utilized lasix with fluids to help promote kaliuresis. Additional lasix 120mg  IV  x 1 dose -c/w lokelma 10g TID -stopping NS, starting bicarb gtt -renal diet -monitor serial K's -final decision on dialysis pending from patient and family, will follow back up with them shortly, repeat labs at 2pm  Metabolic Acidosis -likely secondary to AKI. Lactate WNL -changing fluids to  bicarb  HTN -resume home meds -recommend checking orthostats  Fall Maxillary hematoma -mgmt per primary  Anemia -Transfuse for Hgb<7 g/dL -checking iron stores  TAA, AAA s/p EVAR with endoleak -mgmt per primary service  DNR/DNI.  Recommendations conveyed to primary service.  Discussed with daughter at bedside.  Deborah Moses [facility] 12/11/2023 6:59 AM   _____________________________________________________________________________________   History of Present Illness: Deborah Moses is a/an 89 y.o. female with a past medical history of CKD4, TAA/AAA s/p EVAR now with enlarging aneurysm and endoleak, CHB s/p PPM, AFIB, IDA who presents to [facility] with fall. She reports that she has been having some balance issues, gets dizzy when using the bathroom at night. This is the 2nd fall in the last month. She reports that she was getting the mail and fell over (was in her house slippers). Denies any syncope at the time. She does report diarrhea which started yesterday. She also reports decreased PO intake as well. She has noticed that she hasn't urinated much since yesterday. She does report tremors which is new. She was found to have have AKI with a baseline Cr of ~2.2 up to 8.1 and persistent hyperK with K initially up to 6.7. It did initially improve down to 5.8 this AM however upon the time of my encounter, K was up to 6.7 as per RN (she had received a call). Daughter at bedside, they are going to discuss whether or not she would want dialysis. She did receive lasix earlier today and has a foley in place and only made 30cc of urine thus far.   Medications:  Current Facility-Administered Medications  Medication Dose Route Frequency Provider Last Rate Last Admin   0.9 %  sodium chloride  infusion   Intravenous Continuous Segars, Jonathan, MD 100  mL/hr at 12/11/23 0421 New Bag at 12/11/23 0421   albuterol (PROVENTIL) (2.5 MG/3ML) 0.083% nebulizer solution 2.5 mg  2.5 mg  Nebulization Q4H Segars, Dorn, MD       amLODipine  (NORVASC ) tablet 5 mg  5 mg Oral Daily Segars, Dorn, MD       Chlorhexidine  Gluconate Cloth 2 % PADS 6 each  6 each Topical Daily Segars, Dorn, MD       furosemide (LASIX) 100 mg in dextrose  5 % 50 mL IVPB  100 mg Intravenous Once Segars, Jonathan, MD       metoprolol  succinate (TOPROL -XL) 24 hr tablet 50 mg  50 mg Oral Daily Segars, Dorn, MD       sodium bicarbonate tablet 650 mg  650 mg Oral BID Segars, Jonathan, MD       sodium zirconium cyclosilicate (LOKELMA) packet 10 g  10 g Oral TID Segars, Jonathan, MD   10 g at 12/11/23 0302   Followed by   NOREEN ON 12/13/2023] sodium zirconium cyclosilicate (LOKELMA) packet 10 g  10 g Oral Daily Segars, Jonathan, MD       Current Outpatient Medications  Medication Sig Dispense Refill   acetaminophen  (TYLENOL ) 500 MG tablet Take 500 mg by mouth every 6 (six) hours as needed for moderate pain.      alendronate (FOSAMAX) 70 MG tablet Take 70 mg by mouth every Monday. Take with a full glass of water  on an empty stomach.     amLODipine  (NORVASC ) 5 MG tablet Take 5 mg by mouth daily.     apixaban  (ELIQUIS ) 2.5 MG TABS tablet Take 1 tablet (2.5 mg total) by mouth 2 (two) times daily. 180 tablet 1   calcium -vitamin D  (OSCAL WITH D) 500-200 MG-UNIT TABS tablet Take 1 tablet by mouth daily.      famotidine (PEPCID) 20 MG tablet Take 20 mg by mouth daily. (Patient taking differently: Take 20 mg by mouth as needed.)     Ferrous Sulfate (IRON PO) Take by mouth. (Patient taking differently: Take by mouth 2 (two) times daily.)     lisinopril  (PRINIVIL ,ZESTRIL ) 20 MG tablet Take 40 mg by mouth daily.      lovastatin (MEVACOR) 20 MG tablet Take 20 mg by mouth at bedtime.     metoprolol  succinate (TOPROL -XL) 50 MG 24 hr tablet Take 1 tablet (50 mg total) by mouth daily. 90 tablet 0   olmesartan (BENICAR) 20 MG tablet Take 20 mg by mouth daily.     Omega-3 Fatty Acids (FISH OIL) 1000 MG CPDR Take  1,000 mg by mouth in the morning, at noon, and at bedtime.  (Patient taking differently: Take 1,000 mg by mouth 2 (two) times daily.)     Turmeric (QC TUMERIC COMPLEX PO) Take 5 mg by mouth daily. Tumeric-curcumin caps 06-998 mg     digoxin (LANOXIN) 0.125 MG tablet Take 1 tablet (0.125 mg total) by mouth as directed. Take one tablet by mouth Monday, Tuesday, Wednesday, Thursday and Friday.  Do NOT take on Saturday or Sunday. (Patient not taking: Reported on 12/11/2023) 90 tablet 3     ALLERGIES Patient has no known allergies.  MEDICAL HISTORY Past Medical History:  Diagnosis Date   AAA (abdominal aortic aneurysm)    Heart block AV third degree (HCC)    HTN (hypertension)    Hyperlipidemia    Vertigo      SOCIAL HISTORY Social History   Socioeconomic History   Marital status: Widowed    Spouse name: Not  on file   Number of children: 4   Years of education: Not on file   Highest education level: Not on file  Occupational History   Not on file  Tobacco Use   Smoking status: Never   Smokeless tobacco: Never  Vaping Use   Vaping status: Never Used  Substance and Sexual Activity   Alcohol use: Never   Drug use: Never   Sexual activity: Not on file  Other Topics Concern   Not on file  Social History Narrative   Not on file   Social Drivers of Health   Financial Resource Strain: Not on file  Food Insecurity: Not on file  Transportation Needs: Not on file  Physical Activity: Not on file  Stress: Not on file  Social Connections: Not on file  Intimate Partner Violence: Not on file     FAMILY HISTORY Family History  Problem Relation Age of Onset   Heart failure Father      Review of Systems: 12 systems reviewed Otherwise as per HPI, all other systems reviewed and negative  Physical Exam: Vitals:   12/11/23 0515 12/11/23 0545  BP: (!) 169/88 (!) 175/82  Pulse: 72 70  Resp: 16 14  Temp:    SpO2: 100% 98%   Total I/O In: 1931.1 [I.V.:380.6; IV  Piggyback:1550.5] Out: 0   Intake/Output Summary (Last 24 hours) at 12/11/2023 0659 Last data filed at 12/11/2023 0402 Gross per 24 hour  Intake 1931.14 ml  Output 0 ml  Net 1931.14 ml   General: ill-appearing, no acute distress HEENT: anicteric sclera, oropharynx clear without lesions, ecchymosis left face, left eye shut CV: regular rate, normal rhythm, no murmurs, no gallops, no rubs Lungs: clear to auscultation bilaterally, normal work of breathing Abd: soft, non-tender, non-distended Skin: ecchymosis left face Psych: alert, engaged, appropriate mood and affect Musculoskeletal: 1+ pitting edema b/l LEs Neuro: normal speech, no gross focal deficits , tremulous  Test Results Reviewed Lab Results  Component Value Date   NA 134 (L) 12/11/2023   K 5.8 (H) 12/11/2023   CL 105 12/11/2023   CO2 15 (L) 12/11/2023   BUN 87 (H) 12/11/2023   CREATININE 7.04 (H) 12/11/2023   CALCIUM  9.8 12/11/2023   ALBUMIN 3.1 (L) 12/10/2023   PHOS 4.1 12/11/2023     I have reviewed all relevant outside healthcare records related to the patient's kidney injury.

## 2023-12-11 NOTE — Procedures (Signed)
 Central Venous Catheter Insertion Procedure Note  Deborah Moses  [id number]  1932/02/09  Date:12/11/23  Time:6:30 PM   Provider Performing:Krystopher Kuenzel   Procedure: Insertion of Non-tunneled Central Venous Catheter(36556)with US  guidance (23062)    Indication(s) Hemodialysis initiation   Consent Risks of the procedure as well as the alternatives and risks of each were explained to the patient and/or caregiver.  Consent for the procedure was obtained and is signed in the bedside chart  Anesthesia Topical only with 1% lidocaine    Timeout Verified patient identification, verified procedure, site/side was marked, verified correct patient position, special equipment/implants available, medications/allergies/relevant history reviewed, required imaging and test results available.  Sterile Technique Maximal sterile technique including full sterile barrier drape, hand hygiene, sterile gown, sterile gloves, mask, hair covering, sterile ultrasound probe cover (if used).  Procedure Description Area of catheter insertion was cleaned with chlorhexidine  and draped in sterile fashion.   With real-time ultrasound guidance a HD catheter was placed into the right internal jugular vein.  Nonpulsatile blood flow and easy flushing noted in all ports.  The catheter was sutured in place and sterile dressing applied.  Complications/Tolerance None; patient tolerated the procedure well. Chest X-ray is ordered to verify placement for internal jugular or subclavian cannulation.  Chest x-ray is not ordered for femoral cannulation.  EBL Minimal  Specimen(s) None

## 2023-12-11 NOTE — Progress Notes (Addendum)
 The patient is briefly being moved to ICU for HD catheter placement.  Plan to start hemodialysis today.  Appreciate nephrology and critical care medicine assistance.  Updated the patient's daughter at bedside.  No charge note.

## 2023-12-11 NOTE — Progress Notes (Signed)
 RRT called to come assess pt d/t tachycardia that will not resolve. Uf still off, all other VSS, pt is asymptomatic and Oax4.

## 2023-12-11 NOTE — Progress Notes (Signed)
 Tx terminated d/t high hr not resolving, per dr sandfords orders.

## 2023-12-11 NOTE — Progress Notes (Signed)
 Pt did not tolerate tx. Terminated early d/t tachycardia, per dr sanfords orders  12/11/23 2256  Vitals  Temp 97.8 F (36.6 C)  BP 138/72  Pulse Rate (!) 139  Resp 17  Oxygen Therapy  SpO2 (!) 89 %  Post Treatment  Dialyzer Clearance Clear  Liters Processed 19.1  Fluid Removed (mL) 100 mL  Tolerated HD Treatment No (Comment) (tx terminated early d/t unresolving tachycardia)

## 2023-12-11 NOTE — Progress Notes (Signed)
 Orthopedic Tech Progress Note Patient Details:  Deborah Moses [birth year] [id number]  Ortho Devices Type of Ortho Device: Finger splint Ortho Device/Splint Location: rue thumb Ortho Device/Splint Interventions: Ordered, Application, Adjustment   Post Interventions Patient Tolerated: Well Instructions Provided: Care of device, Adjustment of device  [name] PARAS 12/11/2023, 12:40 AM

## 2023-12-12 ENCOUNTER — Inpatient Hospital Stay (HOSPITAL_COMMUNITY)

## 2023-12-12 DIAGNOSIS — J90 Pleural effusion, not elsewhere classified: Secondary | ICD-10-CM | POA: Diagnosis not present

## 2023-12-12 DIAGNOSIS — S0083XA Contusion of other part of head, initial encounter: Secondary | ICD-10-CM | POA: Diagnosis not present

## 2023-12-12 DIAGNOSIS — W19XXXA Unspecified fall, initial encounter: Secondary | ICD-10-CM

## 2023-12-12 DIAGNOSIS — N179 Acute kidney failure, unspecified: Secondary | ICD-10-CM | POA: Diagnosis not present

## 2023-12-12 DIAGNOSIS — Z789 Other specified health status: Secondary | ICD-10-CM

## 2023-12-12 DIAGNOSIS — J811 Chronic pulmonary edema: Secondary | ICD-10-CM | POA: Diagnosis not present

## 2023-12-12 DIAGNOSIS — N185 Chronic kidney disease, stage 5: Secondary | ICD-10-CM

## 2023-12-12 DIAGNOSIS — Z7189 Other specified counseling: Secondary | ICD-10-CM

## 2023-12-12 DIAGNOSIS — S61011A Laceration without foreign body of right thumb without damage to nail, initial encounter: Secondary | ICD-10-CM

## 2023-12-12 DIAGNOSIS — I712 Thoracic aortic aneurysm, without rupture, unspecified: Secondary | ICD-10-CM | POA: Diagnosis not present

## 2023-12-12 DIAGNOSIS — Z515 Encounter for palliative care: Secondary | ICD-10-CM

## 2023-12-12 DIAGNOSIS — R0902 Hypoxemia: Secondary | ICD-10-CM | POA: Diagnosis not present

## 2023-12-12 DIAGNOSIS — Z66 Do not resuscitate: Secondary | ICD-10-CM

## 2023-12-12 LAB — RENAL FUNCTION PANEL
Albumin: 2.7 g/dL — ABNORMAL LOW (ref 3.5–5.0)
Anion gap: 13 (ref 5–15)
BUN: 54 mg/dL — ABNORMAL HIGH (ref 8–23)
CO2: 25 mmol/L (ref 22–32)
Calcium: 8.9 mg/dL (ref 8.9–10.3)
Chloride: 97 mmol/L — ABNORMAL LOW (ref 98–111)
Creatinine, Ser: 5.61 mg/dL — ABNORMAL HIGH (ref 0.44–1.00)
GFR, Estimated: 7 mL/min — ABNORMAL LOW (ref >60)
Glucose, Bld: 109 mg/dL — ABNORMAL HIGH (ref 70–99)
Phosphorus: 4.2 mg/dL (ref 2.5–4.6)
Potassium: 4 mmol/L (ref 3.5–5.1)
Sodium: 135 mmol/L (ref 135–145)

## 2023-12-12 LAB — CBC
HCT: 23 % — ABNORMAL LOW (ref 36.0–46.0)
Hemoglobin: 7.5 g/dL — ABNORMAL LOW (ref 12.0–15.0)
MCH: 30.1 pg (ref 26.0–34.0)
MCHC: 32.6 g/dL (ref 30.0–36.0)
MCV: 92.4 fL (ref 80.0–100.0)
Platelets: 228 K/uL (ref 150–400)
RBC: 2.49 MIL/uL — ABNORMAL LOW (ref 3.87–5.11)
RDW: 13.5 % (ref 11.5–15.5)
WBC: 7.2 K/uL (ref 4.0–10.5)
nRBC: 0 % (ref 0.0–0.2)

## 2023-12-12 LAB — GLUCOSE, CAPILLARY: Glucose-Capillary: 119 mg/dL — ABNORMAL HIGH (ref 70–99)

## 2023-12-12 MED ORDER — ACETAMINOPHEN 650 MG RE SUPP: 650.0000 mg | Freq: Four times a day (QID) | RECTAL | Status: DC | PRN

## 2023-12-12 MED ORDER — ONDANSETRON HCL 4 MG/2ML IJ SOLN: 4.0000 mg | Freq: Four times a day (QID) | INTRAMUSCULAR | Status: DC | PRN

## 2023-12-12 MED ORDER — GLYCOPYRROLATE 0.2 MG/ML IJ SOLN: 0.2000 mg | INTRAMUSCULAR | Status: DC | PRN

## 2023-12-12 MED ORDER — SODIUM CHLORIDE 0.9 % IV SOLN: 250.0000 mL | INTRAVENOUS | Status: DC | PRN

## 2023-12-12 MED ORDER — HALOPERIDOL LACTATE 2 MG/ML PO CONC: 0.5000 mg | ORAL | Status: DC | PRN

## 2023-12-12 MED ORDER — HYDROMORPHONE HCL 1 MG/ML PO LIQD
1.0000 mg | Freq: Four times a day (QID) | ORAL | Status: DC | PRN
  Administered 2023-12-13 – 2023-12-14 (×3): 1 mg via ORAL
  Filled 2023-12-12 (×3): qty 1

## 2023-12-12 MED ORDER — FUROSEMIDE 10 MG/ML IJ SOLN
160.0000 mg | Freq: Once | INTRAMUSCULAR | Status: AC
  Administered 2023-12-12: 160 mg via INTRAVENOUS
  Filled 2023-12-12: qty 16

## 2023-12-12 MED ORDER — HALOPERIDOL LACTATE 5 MG/ML IJ SOLN: 0.5000 mg | INTRAMUSCULAR | Status: DC | PRN

## 2023-12-12 MED ORDER — BIOTENE DRY MOUTH MT LIQD: 15.0000 mL | OROMUCOSAL | Status: DC | PRN

## 2023-12-12 MED ORDER — ACETAMINOPHEN 325 MG PO TABS
650.0000 mg | ORAL_TABLET | Freq: Four times a day (QID) | ORAL | Status: DC | PRN
  Administered 2023-12-12: 650 mg via ORAL
  Filled 2023-12-12: qty 2

## 2023-12-12 MED ORDER — POLYVINYL ALCOHOL 1.4 % OP SOLN
1.0000 [drp] | Freq: Four times a day (QID) | OPHTHALMIC | Status: DC | PRN
  Filled 2023-12-12: qty 15

## 2023-12-12 MED ORDER — SODIUM CHLORIDE 0.9% FLUSH: 3.0000 mL | INTRAVENOUS | Status: DC | PRN

## 2023-12-12 MED ORDER — HALOPERIDOL 0.5 MG PO TABS
0.5000 mg | ORAL_TABLET | ORAL | Status: DC | PRN
Start: 2023-12-12 — End: 2023-12-14

## 2023-12-12 MED ORDER — SODIUM CHLORIDE 0.9% FLUSH
3.0000 mL | Freq: Two times a day (BID) | INTRAVENOUS | Status: DC
  Administered 2023-12-12: 3 mL via INTRAVENOUS

## 2023-12-12 MED ORDER — LORAZEPAM 2 MG/ML IJ SOLN: 1.0000 mg | INTRAMUSCULAR | Status: DC | PRN

## 2023-12-12 MED ORDER — GLYCOPYRROLATE 1 MG PO TABS
1.0000 mg | ORAL_TABLET | ORAL | Status: DC | PRN
  Administered 2023-12-13: 1 mg via ORAL
  Filled 2023-12-12: qty 1

## 2023-12-12 MED ORDER — ONDANSETRON 4 MG PO TBDP: 4.0000 mg | ORAL_TABLET | Freq: Four times a day (QID) | ORAL | Status: DC | PRN

## 2023-12-12 MED ORDER — LORAZEPAM 2 MG/ML PO CONC: 1.0000 mg | Freq: Four times a day (QID) | ORAL | Status: DC | PRN

## 2023-12-12 NOTE — Consult Note (Signed)
 Consultation Note Date: 12/12/2023   Patient Name: Deborah Moses  DOB: 02-Sep-1931  MRN: [id number]  Age / Sex: 89 y.o., female  PCP: Orpha Yancey LABOR, MD Referring Physician: Raenelle Donalda HERO, MD  Reason for Consultation: Establishing goals of care, Pain control, Psychosocial/spiritual support, and Terminal Care, GOC discussions, possibly comfort care. renal failure, not a dialysis candidate   HPI/Patient Profile: 89 y.o. female  with past medical history of CKD 4, type B dissection, AAA-s/p repair with endoleak, CHB s/p PPM was admitted on 12/10/2023 after ground level fall.  Left maxillary hematoma-found to have oliguric AKI on CKD 4 with severe hyperkalemia. Hospital course complicated by worsening hypoxemia requiring high flow oxygen. After discussions with attending, family opted for patient's transition to full comfort measures - discharge plan pending.  Clinical Assessment and Goals of Care: I have reviewed medical records including EPIC notes, labs, any available advanced directives, and imaging. Received report from primary RN - no acute concerns.   Went to visit patient at bedside - son/George and pastor present. Patient was lying in bed asleep - I did not attempt to wake her to preserve comfort. No signs or non-verbal gestures of pain or discomfort noted. No respiratory distress, increased work of breathing, or secretions noted. She is on 8L O2 [place].  Son confirms goals for full comfort measures; however, states his sister/Sheryl is primary person to speak with. Therapeutic listening provided as he reflects on the relationship he and his siblings have with patient. Patient has three daughters and one son. Currently, family are waiting for one daughter to arrive from [facility] this afternoon.  12:22 PM Attempted to call Sheryl - no answer - confidential voicemail left and PMT phone number provided with request to  return call.  1:20 PM Received notification Sheryl returned call - was able to speak with her via phone. Sheryl indicates family do not want to make any decisions until other sibling arrives from [facility] this afternoon. From her understanding, patient's oxygen requirement is a barrier, but she does tell me she is confused at this time on what they should be deciding. Was able to clarify that family agree with comfort/hospice care; however, they are unsure if they want patient transferred to hospice facility closer to family vs in hospital death. Clarified that patient is stable for transfer with current 8L O2 if they desired. Sheryl appreciates information today and would like to continue discussions once her sister arrives. At this time, she is unsure of when she will be at the hospital. Vital signs reviewed per her request.   Visit also consisted of discussions dealing with the complex and emotionally intense issues of symptom management and palliative care in the setting of serious and life-threatening illness.  Discussed with family the importance of continued conversation with each other and the medical providers regarding overall plan of care and treatment options, ensuring decisions are within the context of the patient's values and GOCs.    Questions and concerns were addressed. The patient/family was encouraged to call with questions and/or concerns. PMT card was provided.   Primary Decision Maker: NEXT OF KIN - reasonably available adult children    SUMMARY OF RECOMMENDATIONS   Continue full comfort measures Continue DNR/DNI as previously documented - durable DNR form completed and placed in shadow chart. Copy was made and will be scanned into Vynca/ACP tab Family deciding between discharge to hospice facility vs in hospital death - waiting for one daughter to arrive this afternoon prior to making final  decisions Added orders for EOL symptom management and to reflect full comfort measures,  as well as discontinued orders that were not focused on comfort Unrestricted visitation orders were placed per current [place] EOL visitation policy  Nursing to provide frequent assessments and administer PRN medications as clinically necessary to ensure EOL comfort PMT will continue to follow and support holistically  Symptom Management Dilaudid  PRN pain/dyspnea/increased work of breathing/RR>25 Tylenol  PRN pain/fever Biotin PRN dry mouth Robinul PRN secretions Haldol PRN agitation/delirium Ativan PRN anxiety/seizure/sleep/distress Zofran  PRN nausea/vomiting Liquifilm Tears PRN dry eye Continue amlodipine  and metoprolol  while tolerating POs    Code Status/Advance Care Planning: DNR  Palliative Prophylaxis:  Aspiration, Bowel Regimen, Delirium Protocol, Frequent Pain Assessment, and Palliative Wound Care  Additional Recommendations (Limitations, Scope, Preferences): Full Comfort Care  Psycho-social/Spiritual:  Desire for further Chaplaincy support:no Created space and opportunity for patient and family to express thoughts and feelings regarding patient's current medical situation.  Emotional support and therapeutic listening provided.  Prognosis:  < 2 weeks  Discharge Planning: Hospice facility vs in hospital death     Primary Diagnoses: Present on Admission:  Renal failure   I have reviewed the medical record, interviewed the patient and family, and examined the patient. The following aspects are pertinent.  Past Medical History:  Diagnosis Date   AAA (abdominal aortic aneurysm)    Heart block AV third degree (HCC)    HTN (hypertension)    Hyperlipidemia    Vertigo    Social History   Socioeconomic History   Marital status: Widowed    Spouse name: Not on file   Number of children: 4   Years of education: Not on file   Highest education level: Not on file  Occupational History   Not on file  Tobacco Use   Smoking status: Never   Smokeless tobacco:  Never  Vaping Use   Vaping status: Never Used  Substance and Sexual Activity   Alcohol use: Never   Drug use: Never   Sexual activity: Not on file  Other Topics Concern   Not on file  Social History Narrative   Not on file   Social Drivers of Health   Financial Resource Strain: Not on file  Food Insecurity: No Food Insecurity (12/11/2023)   Hunger Vital Sign    Worried About Running Out of Food in the Last Year: Never true    Ran Out of Food in the Last Year: Never true  Transportation Needs: No Transportation Needs (12/11/2023)   PRAPARE - Administrator, Civil Service (Medical): No    Lack of Transportation (Non-Medical): No  Physical Activity: Not on file  Stress: Not on file  Social Connections: Moderately Isolated (12/11/2023)   Social Connection and Isolation Panel    Frequency of Communication with Friends and Family: More than three times a week    Frequency of Social Gatherings with Friends and Family: Once a week    Attends Religious Services: More than 4 times per year    Active Member of [employer] or Organizations: No    Attends Banker Meetings: Never    Marital Status: Widowed   Family History  Problem Relation Age of Onset   Heart failure Father    Scheduled Meds:  amLODipine   5 mg Oral Daily   Chlorhexidine  Gluconate Cloth  6 each Topical Q0600   metoprolol  succinate  50 mg Oral Daily   sodium chloride  flush  3 mL Intravenous Q12H   sodium zirconium  cyclosilicate  10 g Oral TID   Continuous Infusions:  sodium chloride      PRN Meds:.sodium chloride , acetaminophen  **OR** acetaminophen , acetaminophen , antiseptic oral rinse, artificial tears, glycopyrrolate **OR** glycopyrrolate **OR** glycopyrrolate, haloperidol **OR** haloperidol **OR** haloperidol lactate, HYDROmorphone  HCl, LORazepam, LORazepam, ondansetron  **OR** ondansetron  (ZOFRAN ) IV, sodium chloride  flush Medications Prior to Admission:  Prior to Admission medications    Medication Sig Start Date End Date Taking? Authorizing Provider  acetaminophen  (TYLENOL ) 500 MG tablet Take 500 mg by mouth every 6 (six) hours as needed for moderate pain.    Yes [provider]  alendronate (FOSAMAX) 70 MG tablet Take 70 mg by mouth every Monday. Take with a full glass of water  on an empty stomach.   Yes [provider]  amLODipine  (NORVASC ) 5 MG tablet Take 5 mg by mouth daily.   Yes [provider]  apixaban  (ELIQUIS ) 2.5 MG TABS tablet Take 1 tablet (2.5 mg total) by mouth 2 (two) times daily. 04/15/23  Yes Waddell Danelle ORN, MD  calcium -vitamin D  (OSCAL WITH D) 500-200 MG-UNIT TABS tablet Take 1 tablet by mouth daily.    Yes [provider]  famotidine (PEPCID) 20 MG tablet Take 20 mg by mouth daily. Patient taking differently: Take 20 mg by mouth as needed.   Yes [provider]  Ferrous Sulfate (IRON PO) Take by mouth. Patient taking differently: Take by mouth 2 (two) times daily.   Yes [provider]  lisinopril  (PRINIVIL ,ZESTRIL ) 20 MG tablet Take 40 mg by mouth daily.    Yes [provider]  lovastatin (MEVACOR) 20 MG tablet Take 20 mg by mouth at bedtime.   Yes [provider]  metoprolol  succinate (TOPROL -XL) 50 MG 24 hr tablet Take 1 tablet (50 mg total) by mouth daily. 09/26/20  Yes Lesia Ozell Barter, PA-C  olmesartan (BENICAR) 20 MG tablet Take 20 mg by mouth daily. 08/30/22  Yes [provider]  Omega-3 Fatty Acids (FISH OIL) 1000 MG CPDR Take 1,000 mg by mouth in the morning, at noon, and at bedtime.  Patient taking differently: Take 1,000 mg by mouth 2 (two) times daily.   Yes [provider]  Turmeric (QC TUMERIC COMPLEX PO) Take 5 mg by mouth daily. Tumeric-curcumin caps 06-998 mg   Yes [provider]  digoxin (LANOXIN) 0.125 MG tablet Take 1 tablet (0.125 mg total) by mouth as directed. Take one tablet by mouth Monday, Tuesday, Wednesday, Thursday and Friday.   Do NOT take on Saturday or Sunday. Patient not taking: Reported on 12/11/2023 12/09/23   Waddell Danelle ORN, MD   No Known Allergies Review of Systems  Unable to perform ROS: Acuity of condition    Physical Exam Vitals and nursing note reviewed.  Constitutional:      General: She is not in acute distress.    Appearance: She is ill-appearing.  Pulmonary:     Effort: No respiratory distress.  Skin:    General: Skin is warm and dry.  Neurological:     Mental Status: She is oriented to person, place, and time. She is lethargic.     Vital Signs: BP (!) 169/78 (BP Location: Right Arm)   Pulse 69   Temp 97.8 F (36.6 C) (Oral)   Resp 11   Ht 5' 2 (1.575 m)   Wt 57.8 kg   SpO2 93%   BMI 23.31 kg/m  Pain Scale: 0-10   Pain Score: Asleep   SpO2: SpO2: 93 % O2 Device:SpO2: 93 % O2 Flow  Rate: .O2 Flow Rate (L/min): 6 L/min  IO: Intake/output summary:  Intake/Output Summary (Last 24 hours) at 12/12/2023 1212 Last data filed at 12/11/2023 2256 Gross per 24 hour  Intake 50 ml  Output 100 ml  Net -50 ml    LBM: Last BM Date : 12/10/23 Baseline Weight: Weight: 47.6 kg Most recent weight: Weight: 57.8 kg     Palliative Assessment/Data: PPS 20%     Time In: 1200 Time Out: 1315 Time Total: 75 minutes  Signed by: Jeoffrey CHRISTELLA Sharps, NP   Please contact Palliative Medicine Team phone at [phone] for questions and concerns.  For individual provider: See Amion  *Portions of this note are a verbal dictation therefore any spelling and/or grammatical errors are due to the Dragon Medical One system interpretation.

## 2023-12-12 NOTE — Plan of Care (Signed)
 Brief Palliative Medicine Progress Note:  PMT has a scheduled meeting with patient's family tomorrow 10/28 at 11:00am.  Thank you for allowing PMT to assist in the care of this patient.  Herley Bernardini M. Claudene [facility] Palliative Medicine Team Team Phone: [phone] NO CHARGE

## 2023-12-12 NOTE — Plan of Care (Signed)
  Problem: Clinical Measurements: Goal: Will remain free from infection Outcome: Progressing Goal: Cardiovascular complication will be avoided Outcome: Progressing   Problem: Activity: Goal: Risk for activity intolerance will decrease Outcome: Progressing   

## 2023-12-12 NOTE — Evaluation (Signed)
Occupational Therapy Evaluation Patient Details Name: Deborah Moses MRN: [id number] DOB: 02-Jun-1931 Today's Date: 12/12/2023   History of Present Illness   Deborah Moses is a 89 y.o. female who presented to [facility] ED 12/10/23 after ground level fall outside d/t tripping and hitting her right side and face. Work-up found renal failure and severe range hyperkalemia. CT Head, C spine, Maxillofacial, C/A/P, CXR, Pelvic XR, R hand XR negative for significant traumatic injuries, there is a large hematoma over the L maxilla and ? radioopaque debris around the R thumb. 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  ? family meeting 10/28 for consideration of hospice.     Clinical Impressions Limited assessment this date due to ? Hospice transition.  OT focused on repositioning in bed for comfort, and upper body ROM to limit swelling and stiffness.  Family is flying in with plan of meeting with Palliative Care and determination of goals of care.  OT will retain order until a formal decision can be made.  Generic POC and goals set for now, and SNF recommendation placed.        If plan is discharge home, recommend the following:   Assist for transportation;Assistance with cooking/housework;A lot of help with bathing/dressing/bathroom;A lot of help with walking and/or transfers     Functional Status Assessment   Patient has had a recent decline in their functional status and demonstrates the ability to make significant improvements in function in a reasonable and predictable amount of time.     Equipment Recommendations   None recommended by OT     Recommendations for Other Services         Precautions/Restrictions   Precautions Precautions: Fall Recall of Precautions/Restrictions: Impaired Restrictions Weight Bearing Restrictions Per Provider Order: No      Mobility Bed Mobility Overal bed mobility: Needs Assistance Bed Mobility: Rolling Rolling: Mod assist, Max assist              Transfers                   General transfer comment: NT      Balance                                           ADL either performed or assessed with clinical judgement   ADL   Eating/Feeding: Maximal assistance;Bed level   Grooming: Wash/dry hands;Wash/dry face;Bed level;Moderate assistance                                       Vision Ability to See in Adequate Light: 1 Impaired Additional Comments: L eye swollen shut     Perception Perception: Not tested       Praxis Praxis: Not tested       Pertinent Vitals/Pain Pain Assessment Pain Assessment: No/denies pain     Extremity/Trunk Assessment Upper Extremity Assessment Upper Extremity Assessment: Generalized weakness;Right hand dominant;LUE deficits/detail;RUE deficits/detail RUE Deficits / Details: injury to thumb RUE Coordination: decreased gross motor LUE Deficits / Details: swollen LUE Coordination: decreased gross motor;decreased fine motor   Lower Extremity Assessment Lower Extremity Assessment: Defer to PT evaluation   Cervical / Trunk Assessment Cervical / Trunk Assessment: Kyphotic   Communication Communication  Communication: No apparent difficulties Factors Affecting Communication: Hearing impaired   Cognition Arousal: Alert Behavior During Therapy: WFL for tasks assessed/performed Cognition: Cognition impaired       Memory impairment (select all impairments): Short-term memory                       Following commands: Intact       Cueing  General Comments   Cueing Techniques: Verbal cues;Gestural cues      Exercises     Shoulder Instructions      Home Living Family/patient expects to be discharged to:: Hospice/Palliative care Living Arrangements: Alone Available Help at Discharge:  Family;Available PRN/intermittently Type of Home: House Home Access: Stairs to enter [employer] of Steps: 3 Entrance Stairs-Rails: Left Home Layout: One level     Bathroom Shower/Tub: Chief Strategy Officer: Standard     Home Equipment: Hand held shower head;Grab bars - tub/shower;Grab bars - toilet;Cane - single point;Rolling Walker (2 wheels)          Prior Functioning/Environment Prior Level of Function : Independent/Modified Independent             Mobility Comments: Ambulates using SPC. 2 falls in under 2-3 months. ADLs Comments: Indep with ADLs. Doesn't drive. Manages her own medicaitons. Cooks/clean. Pt states she mainly microwaves meals.    OT Problem List: Decreased strength;Decreased activity tolerance;Impaired balance (sitting and/or standing)   OT Treatment/Interventions: Self-care/ADL training;Therapeutic activities;Patient/family education;Balance training;DME and/or AE instruction      OT Goals(Current goals can be found in the care plan section)   Acute Rehab OT Goals Patient Stated Goal: Return home OT Goal Formulation: With patient Time For Goal Achievement: 12/30/2023 Potential to Achieve Goals: Fair ADL Goals Pt Will Perform Eating: with set-up;sitting Pt Will Perform Grooming: with set-up;sitting   OT Frequency:  Min 2X/week    Co-evaluation              AM-PAC OT 6 Clicks Daily Activity     Outcome Measure Help from another person eating meals?: A Lot Help from another person taking care of personal grooming?: A Lot Help from another person toileting, which includes using toliet, bedpan, or urinal?: Total Help from another person bathing (including washing, rinsing, drying)?: A Lot Help from another person to put on and taking off regular upper body clothing?: A Lot Help from another person to put on and taking off regular lower body clothing?: Total 6 Click Score: 10   End of Session Nurse Communication:  Mobility status  Activity Tolerance: Treatment limited secondary to medical complications (Comment) Patient left: in bed;with call bell/phone within reach;with family/visitor present  OT Visit Diagnosis: Muscle weakness (generalized) (M62.81);History of falling (Z91.81)                Time: 1342-1400 OT Time Calculation (min): 18 min Charges:  OT General Charges $OT Visit: 1 Visit OT Evaluation $OT Eval Moderate Complexity: 1 Mod  12/12/2023  RP, OTR/L  Acute Rehabilitation Services  Office:  [phone]   Deborah Moses 12/12/2023, 2:13 PM

## 2023-12-12 NOTE — Progress Notes (Addendum)
 Overnight cross-coverage  Patient admitted for AKI on CKD stage IV, severe hyperkalemia which failed medical management, metabolic acidosis.  She is DNR/DNI.  Nephrology had a discussion with the family and decision was made to move forward with dialysis for tonight until family has time to come in and see her.  Nephrology had recommended not pursuing further dialysis treatments thereafter as it was felt that the patient is not a long-term candidate for dialysis given her medical comorbidities and limited prognosis in the setting of her expanding aortic aneurysm with endoleak.  Nephrology had recommended transitioning to comfort care tomorrow after other family members are able to come in and see her.  Palliative care was consulted.  Patient underwent temporary HD catheter placement and was taken for dialysis tonight, however, was not able to tolerate as after 100 mL fluid was removed, she became hypotensive with SBP in the 80s and went into A-fib with RVR with rate in the 130s (history of paroxysmal A-fib).  Also now having increasing oxygen requirement .  Prior to dialysis, she was satting well on 2 L [place] but now requiring 15 L HFNC to maintain sats in the 90s.  Nephrology had given albumin after which blood pressure improved.   I have seen the patient and she denies any dizziness, chest pain, or shortness of breath.  She is resting comfortably.  In A-fib and heart rate currently 110-120.  Most recent blood pressure 133/80.  Satting 95-96% on 15 L HFNC.  No tachypnea or respiratory distress.  Repeat chest x-ray showing mild to moderate interstitial pulmonary edema and small bilateral pleural effusions.   I also spoke to the patient's daughter Channing at bedside who understands that the patient is critically ill and agrees with DNR/DNI status.  Daughter informed me that nephrology had recommended transitioning to comfort measures but she wants to wait until daytime as her sister lives in [place]  and her flight does  not reach [place] until afternoon.  Daughter agrees with palliative care consult.  Will continue to monitor for now.  Continue supplemental oxygen, BiPAP can be tried if her respiratory status worsens.  Addendum/update.  I have discontinued scheduled albuterol as patient has no wheezing on exam and it will make her tachycardia worse.

## 2023-12-12 NOTE — TOC Initial Note (Addendum)
 Transition of Care [facility]) - Initial/Assessment Note    Patient Details  Name: Deborah Moses MRN: [id number] Date of Birth: 1931/05/08  Transition of [facility]) CM/SW Contact:    Inocente GORMAN Kindle, LCSW Phone Number: 12/12/2023, 9:47 AM  Clinical Narrative:                 9:48 AM-CSW received request for Hospice consult with daughter's preference for [facility]. CSW spoke with Dorina at [facility] who will speak with daughter to determine if she would like to move forward with home hospice versus [facility].   Expected Discharge Plan: Home w Hospice Care Barriers to Discharge:  (hospice decision and DME delivery)   Patient Goals and CMS Choice   CMS Medicare.gov Compare Post Acute Care list provided to:: Patient Represenative (must comment) Choice offered to / list presented to : Adult Children [employer] ownership interest in [facility].provided to:: Adult Children    Expected Discharge Plan and Services In-house Referral: Clinical Social Work Discharge Planning Services: CM Consult Post Acute Care Choice: Hospice Living arrangements for the past 2 months: Single Family Home                                      Prior Living Arrangements/Services Living arrangements for the past 2 months: Single Family Home Lives with:: Self Patient language and need for interpreter reviewed:: Yes Do you feel safe going back to the place where you live?: Yes      Need for Family Participation in Patient Care: Yes (Comment) Care giver support system in place?: Yes (comment)   Criminal Activity/Legal Involvement Pertinent to Current Situation/Hospitalization: No - Comment as needed  Activities of Daily Living   ADL Screening (condition at time of admission) Independently performs ADLs?: No Does the patient have a NEW difficulty with bathing/dressing/toileting/self-feeding that is expected to last >3 days?: Yes (Initiates electronic notice to provider for possible OT  consult) Does the patient have a NEW difficulty with getting in/out of bed, walking, or climbing stairs that is expected to last >3 days?: Yes (Initiates electronic notice to provider for possible PT consult) Does the patient have a NEW difficulty with communication that is expected to last >3 days?: No Is the patient deaf or have difficulty hearing?: Yes Does the patient have difficulty seeing, even when wearing glasses/contacts?: No Does the patient have difficulty concentrating, remembering, or making decisions?: No  Permission Sought/Granted Permission sought to share information with : Facility Medical Sales Representative, Family Supports Permission granted to share information with : No  Share Information with NAME: Georgina House- Daughter [phone]  [phone]  Permission granted to share info w AGENCY: Hospice        Emotional Assessment Appearance:: Appears stated age Attitude/Demeanor/Rapport: Unable to Assess Affect (typically observed): Unable to Assess Orientation: : Oriented to Self, Oriented to Place Alcohol / Substance Use: Not Applicable Psych Involvement: No (comment)  Admission diagnosis:  Renal failure [N19] Contusion of face, initial encounter [S00.83XA] Fall, initial encounter [W19.XXXA] Laceration of right thumb without foreign body without damage to nail, initial encounter [S61.011A] Acute renal failure superimposed on stage 5 chronic kidney disease, not on chronic dialysis, unspecified acute renal failure type (HCC) [N17.9, N18.5] Patient Active Problem List   Diagnosis Date Noted   Renal failure 12/10/2023   Iron deficiency anemia 11/21/2023   Diarrhea 11/21/2023   Hypertension 03/24/2020   Shingles (herpes zoster) polyneuropathy 03/24/2020  Vertigo 03/24/2020   AAA (abdominal aortic aneurysm) 08/01/2019   Complete heart block (HCC) 07/11/2017   Abnormal echocardiogram 06/13/2012   Abnormal electrocardiogram 06/13/2012   Cardiac murmur 06/13/2012    Right bundle branch block 06/13/2012   PCP:  Orpha Yancey LABOR, MD Pharmacy:   [facility] [address], [place] - [address] [place] [address] Phone: [phone] Fax: [phone]  [facility] - [address] [place] [address] Phone: [phone] Fax: [phone]     Social Drivers of Health (SDOH) Social History: SDOH Screenings   Food Insecurity: No Food Insecurity (12/11/2023)  Housing: Low Risk  (12/11/2023)  Transportation Needs: No Transportation Needs (12/11/2023)  Utilities: Not At Risk (12/11/2023)  Social Connections: Moderately Isolated (12/11/2023)  Tobacco Use: Low Risk  (12/11/2023)   SDOH Interventions:     Readmission Risk Interventions     No data to display

## 2023-12-12 NOTE — Progress Notes (Signed)
 PROGRESS NOTE        PATIENT DETAILS Name: Deborah Moses Age: 89 y.o. Sex: female Date of Birth: 1931-12-28 Admit Date: 12/10/2023 Admitting Physician Dorn Dawson, MD ERE:[email or web address], Yancey LABOR, MD  Brief Summary: Patient is a 89 y.o.  female with history of CKD 4, type B dissection, AAA-s/p repair with endoleak, CHB s/p PPM-who presented with a ground-level fall-left maxillary hematoma-found to have oliguric AKI on CKD 4 with severe hyperkalemia.  Hospital course complicated by worsening hypoxemia requiring high flow oxygen.  See below for further details.  Significant events: 10/26>> admit to [facility] with severe hyperkalemia-not responsive to standard care-after discussion with family-trial of hemodialysis.  Unfortunately-hemodialysis cut short due to hypotension/tachycardia. 10/27>> worsening hypoxemia-on 10-15 L of oxygen-secondary to pulmonary edema on chest x-ray-discussed with daughter by attending MD-plans are to transition to hospice care.  FiO2 decreased to 8-10 L.  Significant studies: 10/25>> CXR: Patchy airspace opacities 10/25>> x-ray pelvis: No fracture 10/25>> x-ray right hand: No acute/dislocation. 10/25>> CT head: No acute intracranial abnormality 10/25>> CT maxillofacial: No fracture-large hematoma over left maxilla. 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  Consults: Nephrology Palliative care  Subjective: On 15 L of HFNC-eyes closed-slightly tachypneic but otherwise comfortable.  Titrated down to 10 L while I was in the room.  Objective: Vitals: Blood pressure (!) 169/78, pulse 69, temperature 97.8 F (36.6 C),  temperature source Oral, resp. rate 11, height 5' 2 (1.575 m), weight 57.8 kg, SpO2 93%.   Exam: Gen Exam:Alert awake-not in any distress HEENT:atraumatic, normocephalic.  Ecchymosis over left maxillary area. Chest: Bibasilar rales. CVS:S1S2 regular Abdomen:soft non tender, non distended Extremities:-Generalized weakness Neurology: Non focal Skin: no rash  Pertinent Labs/Radiology:    Latest Ref Rng & Units 12/12/2023    5:46 AM 12/11/2023    3:10 AM 12/10/2023    7:57 PM  CBC  WBC 4.0 - 10.5 K/uL 7.2  8.8  9.6   Hemoglobin 12.0 - 15.0 g/dL 7.5  8.1  9.5   Hematocrit 36.0 - 46.0 % 23.0  25.6  29.9   Platelets 150 - 400 K/uL 228  263  323     Lab Results  Component Value Date   NA 135 12/12/2023   K 4.0 12/12/2023   CL 97 (L) 12/12/2023   CO2 25 12/12/2023      Assessment/Plan: Oliguric AKI on CKD 4 Did not tolerate trial of HD on 10/26 Developed pulmonary edema overnight Urine output remains poor Trial of high-dose IV Lasix Care is mostly comfort at this point-family interested in either home or inpatient hospice facility near where they live in [place], [place].  Acute hypoxic respiratory failure Secondary to acute pulmonary edema in the setting of worsening AKI Lasix IV 160 x 1 Titrate down FiO2 as tolerated Comfort care mostly-hospice planned on discharge-awaiting arrival of other family members-1 daughter is set to arrive later this afternoon from [place] . As needed Ativan/Dilaudid  for comfort.  Mechanical fall with left maxillary hematoma Supportive care  History of complete heart block-s/p PPM Supportive care  PAF Beta-blocker No role for anticoagulation  History of type B aortic dissection History  of AAA with endoleak-s/p stenting Supportive care  HTN Metoprolol /amlodipine  if patient able to tolerate oral intake  Goals of care DNR in place-patient would not want any aggressive treatment per daughter at bedside Patient continues to have poor urine  output-did not tolerate dialysis yesterday-now  with acute hypoxic respiratory failure due to acute pulm edema requiring HFNC. At this time-care is supportive-mostly comfort-recommendations are to proceed with hospice care-daughter at bedside is agreeable but awaiting arrival of other family members before final disposition can be determined.  Family interested in both home and residential hospice near where they live in [place] Formal palliative care evaluation pending-consult was placed yesterday.  Code status:   Code Status: Do not attempt resuscitation (DNR) - Comfort care   DVT Prophylaxis: Place and maintain sequential compression device Start: 12/11/23 1739   Family Communication: Daughter at bedside   Disposition Plan: Status is: Inpatient Remains inpatient appropriate because: Severity of illness   Planned Discharge Destination:Hospice care   Diet: Diet Order             Diet renal/carb modified with fluid restriction Diet-HS Snack? Nothing; Fluid restriction: 1200 mL Fluid; Room service appropriate? Yes; Fluid consistency: Thin  Diet effective now                     Antimicrobial agents: Anti-infectives (From admission, onward)    None        MEDICATIONS: Scheduled Meds:  amLODipine   5 mg Oral Daily   Chlorhexidine  Gluconate Cloth  6 each Topical Q0600   metoprolol  succinate  50 mg Oral Daily   sodium chloride  flush  3 mL Intravenous Q12H   sodium zirconium cyclosilicate  10 g Oral TID   Continuous Infusions:  sodium chloride      furosemide     PRN Meds:.sodium chloride , acetaminophen  **OR** acetaminophen , acetaminophen , antiseptic oral rinse, artificial tears, glycopyrrolate **OR** glycopyrrolate **OR** glycopyrrolate, haloperidol **OR** haloperidol **OR** haloperidol lactate, HYDROmorphone  HCl, LORazepam, LORazepam, ondansetron  **OR** ondansetron  (ZOFRAN ) IV, sodium chloride  flush   I have personally reviewed following labs and imaging  studies  LABORATORY DATA: CBC: Recent Labs  Lab 12/10/23 1940 12/10/23 1957 12/11/23 0310 12/12/23 0546  WBC  --  9.6 8.8 7.2  NEUTROABS  --  7.2  --   --   HGB 9.9* 9.5* 8.1* 7.5*  HCT 29.0* 29.9* 25.6* 23.0*  MCV  --  94.3 94.1 92.4  PLT  --  323 263 228    Basic Metabolic Panel: Recent Labs  Lab 12/10/23 1940 12/10/23 1957 12/11/23 0310 12/11/23 0550 12/11/23 0952 12/11/23 1348 12/12/23 0546  NA 132* 133* 133*  134*  --   --  134* 135  K 6.7* 6.6* 6.7*  6.6* 5.8* 5.8* 6.1* 4.0  CL 109 104 105  105  --   --  105 97*  CO2  --  14* 15*  15*  --   --  16* 25  GLUCOSE 136* 136* 110*  113*  --   --  99 109*  BUN 86* 92* 88*  87*  --   --  87* 54*  CREATININE 8.10* 7.40* 7.02*  7.04*  --   --  7.33* 5.61*  CALCIUM   --  10.3 9.9  9.8  --   --  9.8 8.9  MG  --   --  2.4  --   --   --   --   PHOS  --   --  4.1  4.1  --   --   --  4.2    GFR: Estimated Creatinine Clearance: 5.3 mL/min (A) (by C-G formula based on SCr of 5.61 mg/dL (H)).  Liver Function Tests: Recent Labs  Lab 12/10/23 1957 12/11/23 0310 12/12/23 0546  AST 27  --   --   ALT 22  --   --   ALKPHOS 112  --   --   BILITOT 0.5  --   --   PROT 6.5  --   --   ALBUMIN 3.1* 2.7* 2.7*   No results for input(s): LIPASE, AMYLASE in the last 168 hours. No results for input(s): AMMONIA in the last 168 hours.  Coagulation Profile: No results for input(s): INR, PROTIME in the last 168 hours.  Cardiac Enzymes: Recent Labs  Lab 12/10/23 1957 12/11/23 0310  CKTOTAL 58 56    BNP (last 3 results) No results for input(s): PROBNP in the last 8760 hours.  Lipid Profile: No results for input(s): CHOL, HDL, LDLCALC, TRIG, CHOLHDL, LDLDIRECT in the last 72 hours.  Thyroid Function Tests: No results for input(s): TSH, T4TOTAL, FREET4, T3FREE, THYROIDAB in the last 72 hours.  Anemia Panel: No results for input(s): VITAMINB12, FOLATE, FERRITIN, TIBC, IRON,  RETICCTPCT in the last 72 hours.  Urine analysis:    Component Value Date/Time   COLORURINE AMBER (A) 12/11/2023 0303   APPEARANCEUR CLOUDY (A) 12/11/2023 0303   LABSPEC 1.019 12/11/2023 0303   PHURINE 5.0 12/11/2023 0303   GLUCOSEU NEGATIVE 12/11/2023 0303   HGBUR NEGATIVE 12/11/2023 0303   BILIRUBINUR NEGATIVE 12/11/2023 0303   KETONESUR NEGATIVE 12/11/2023 0303   PROTEINUR 30 (A) 12/11/2023 0303   NITRITE NEGATIVE 12/11/2023 0303   LEUKOCYTESUR MODERATE (A) 12/11/2023 0303    Sepsis Labs: Lactic Acid, Venous    Component Value Date/Time   LATICACIDVEN 0.8 12/10/2023 1941    MICROBIOLOGY: No results found for this or any previous visit (from the past 240 hours).  RADIOLOGY STUDIES/RESULTS: DG CHEST PORT 1 VIEW Result Date: 12/12/2023 EXAM: 1 VIEW(S) XRAY OF THE CHEST 12/12/2023 12:32:00 AM COMPARISON: 12/11/2023 CLINICAL HISTORY: Hypoxia FINDINGS: LINES, TUBES AND DEVICES: Right IJ CVC in place with tip terminating over the SVC. LUNGS AND PLEURA: Mild to moderate interstitial edema, progressive. Small bilateral pleural effusions, left greater than right. No focal pulmonary opacity. No pneumothorax. HEART AND MEDIASTINUM: Stable left subclavian pacemaker. Left chest cardiac pacing device noted. Stable thoracic aortic atherosclerosis with known aneurysm, better evaluated on CT. BONES AND SOFT TISSUES: No acute osseous abnormality. IMPRESSION: 1. Mild to moderate interstitial pulmonary edema, progressive. 2. Small bilateral pleural effusions, left greater than right. Electronically signed by: Pinkie Pebbles MD 12/12/2023 12:40 AM EDT RP Workstation: [id number]   DG Chest Port 1 View Result Date: 12/11/2023 EXAM: 1 VIEW(S) XRAY OF THE CHEST 12/11/2023 06:22:00 PM COMPARISON: Chest x-ray 03/12/2008 and 03/11/2008. CLINICAL HISTORY: FINDINGS: LINES, TUBES AND DEVICES: New right-sided central venous catheter with distal tip in the mid SVC. Left-sided pacemaker device/leads at the  level of the arch, unchanged. LUNGS AND PLEURA: Small left pleural effusion. Left basilar infiltrates have not significantly changed. No other focal pulmonary opacity. No pulmonary edema. No pneumothorax. ker is again seen. HEART AND MEDIASTINUM: Heart is mildly enlarged, unchanged. There are severe atherosclerotic calcifications of the aorta. The aorta is. BONES AND SOFT TISSUES: No acute osseous abnormality. IMPRESSION: 1. New right-sided central venous catheter with distal tip in the mid SVC. 2. Small left pleural effusion and left basilar infiltrates, not significantly changed. Electronically signed by: Greig Pique MD 12/11/2023 06:55 PM  EDT RP Workstation: [id number]   US  RENAL Result Date: 12/11/2023 EXAM: US  Retroperitoneum Complete, Renal. CLINICAL HISTORY: 89 year old female with acute kidney injury (AKI). TECHNIQUE: Real-time ultrasound of the retroperitoneum (complete) with image documentation. COMPARISON: Non-contrast CT chest, abdomen, and pelvis 12/10/2023. FINDINGS: RIGHT KIDNEY: Right kidney measures 6.4 x 2.7 x 3.2 cm, 29 ml. Renal cortical atrophy and increased cortical echogenicity, greater on this side. No hydronephrosis, renal stone, or solid renal mass visualized. LEFT KIDNEY: Left kidney measures 7.8 x 3.3 x 3.4 cm, 45 ml. Renal cortical atrophy and increased cortical echogenicity. No hydronephrosis, renal stone, or solid mass visualized. BLADDER: Urinary bladder decompressed with foley catheter balloon visible. PERIHEPATIC REGION: Trace perihepatic ascites suspected (series 1 image 30). IMPRESSION: 1. Renal atrophy. No acute findings. 2. Trace perihepatic ascites. Electronically signed by: Helayne Hurst MD 12/11/2023 09:12 AM EDT RP Workstation: [id number]   DG CHEST PORT 1 VIEW Result Date: 12/11/2023 EXAM: 1 VIEW XRAY OF THE CHEST 12/11/2023 06:02:07 AM COMPARISON: 12/10/2022 CLINICAL HISTORY: [id number] Acute hypoxic respiratory failure (HCC) [id number]. FINDINGS: LUNGS AND PLEURA:  Bilateral pleural effusions. Interstitial edema. Persistent retrocardiac opacification in the left base, which may reflect atelectasis or airspace disease. No pneumothorax. HEART AND MEDIASTINUM: Stable left chest wall pacer. Aortic atherosclerosis unchanged. Appearance of thoracic aortic aneurysm unchanged. BONES AND SOFT TISSUES: No acute osseous abnormality. IMPRESSION: 1. Persistent retrocardiac opacification in the left base, possibly atelectasis or airspace disease. 2. No change in bilateral pleural effusions and interstitial edema. 3. Thoracic aortic aneurysm refer to CT chest report from 12/10/2023. Electronically signed by: Waddell Calk MD 12/11/2023 06:08 AM EDT RP Workstation: [id number]   CT CHEST ABDOMEN PELVIS WO CONTRAST Result Date: 12/10/2023 EXAM: CT CHEST, ABDOMEN AND PELVIS WITHOUT CONTRAST 12/10/2023 07:59:53 PM TECHNIQUE: CT of the chest, abdomen and pelvis was performed without the administration of intravenous contrast. Multiplanar reformatted images are provided for review. Automated exposure control, iterative reconstruction, and/or weight based adjustment of the mA/kV was utilized to reduce the radiation dose to as low as reasonably achievable. COMPARISON: 11/09/2023. CLINICAL HISTORY: Fall FINDINGS: CHEST: MEDIASTINUM AND LYMPH NODES: Mild cardiomegaly. Moderate 3 vessel coronary artery disease. The central airways are clear. No mediastinal, hilar or axillary lymphadenopathy. Stable appearance of chronic dissection of the thoracic aortic arch with aneurysmal dilatation measuring up to 5.7 cm (image 20) and hyperdense intramural hematoma extending along the descending thoracic aorta (image 26). Additional intramural hematoma just above the aortic hiatus (image 49), unchanged. 4.1 cm descending thoracic aortic aneurysm at the aortic hiatus, unchanged. LUNGS AND PLEURA: Small bilateral pleural effusions. No pneumothorax. Mild patchy bilateral lower lobe opacities, likely atelectasis.  Mild patchy ground-glass opacities in the right upper lobe (image 51), possibly mild interstitial edema, although infection/pneumonia is not excluded. ABDOMEN AND PELVIS: LIVER: The liver is unremarkable. GALLBLADDER AND BILE DUCTS: Status post cholecystectomy. No biliary ductal dilatation. SPLEEN: No acute abnormality. PANCREAS: No acute abnormality. ADRENAL GLANDS: No acute abnormality. KIDNEYS, URETERS AND BLADDER: Two small hyperdense/hemorrhagic left renal cysts. Atrophic bilateral kidneys. No stones in the kidneys or ureters. No hydronephrosis. No perinephric or periureteral stranding. Urinary bladder is unremarkable. GI AND BOWEL: Stomach demonstrates no acute abnormality. Diffuse colonic diverticulosis. There is no bowel obstruction. REPRODUCTIVE ORGANS: Status post hysterectomy. PERITONEUM AND RETROPERITONEUM: Trace simple free fluid within pelvis. No free air. VASCULATURE: Aortobiiliac stent graft with stable 5.9 cm infrarenal aortic aneurysm (image 66). ABDOMINAL AND PELVIS LYMPH NODES: No lymphadenopathy. BONES AND SOFT TISSUES: Left chest wall pacemaker noted. No acute osseous abnormality. No  focal soft tissue abnormality. IMPRESSION: 1. No traumatic injury to the chest, abdomen, or pelvis. 2. Stable chronic dissection of the thoracic aortic arch with aneurysmal dilatation measuring up to 5.7 cm. Additional descending thoracic aortic findings as above. 3. Stable 5.9 cm infrarenal abdominal aortic aneurysm with aortobiiliac stent graft. 4. Mild patchy ground-glass opacities in the right upper lobe, possibly mild interstitial edema, although infection or pneumonia is not excluded. 5. Small bilateral pleural effusions. Electronically signed by: Pinkie Pebbles MD 12/10/2023 08:49 PM EDT RP Workstation: [id number]   CT CERVICAL SPINE WO CONTRAST Result Date: 12/10/2023 EXAM: CT CERVICAL SPINE WITHOUT CONTRAST 12/10/2023 07:59:53 PM TECHNIQUE: CT of the cervical spine was performed without the  administration of intravenous contrast. Multiplanar reformatted images are provided for review. Automated exposure control, iterative reconstruction, and/or weight based adjustment of the mA/kV was utilized to reduce the radiation dose to as low as reasonably achievable. COMPARISON: None available. CLINICAL HISTORY: Polytrauma, blunt. Fall when getting mail. FINDINGS: BONES AND ALIGNMENT: No acute fracture or traumatic malalignment. DEGENERATIVE CHANGES: No significant degenerative changes. SOFT TISSUES: No prevertebral soft tissue swelling. IMPRESSION: 1. No acute abnormality of the cervical spine. Electronically signed by: Pinkie Pebbles MD 12/10/2023 08:38 PM EDT RP Workstation: [id number]   CT MAXILLOFACIAL WO CONTRAST Result Date: 12/10/2023 EXAM: CT OF THE FACE WITHOUT CONTRAST 12/10/2023 07:59:53 PM TECHNIQUE: CT of the face was performed without the administration of intravenous contrast. Multiplanar reformatted images are provided for review. Automated exposure control, iterative reconstruction, and/or weight based adjustment of the mA/kV was utilized to reduce the radiation dose to as low as reasonably achievable. COMPARISON: None available. CLINICAL HISTORY: Facial trauma, blunt. Fall when getting mail. FINDINGS: FACIAL BONES: No acute facial fracture. No mandibular dislocation. No suspicious bone lesion. ORBITS: Globes are intact. No acute traumatic injury. No inflammatory change. SINUSES AND MASTOIDS: Chronic opacification of the right maxillary sinus. Mastoid air cells are clear. SOFT TISSUES: Large hematoma centered over the left maxilla (image 38). Associated with left facial soft tissue swelling extending from the frontal region to the left cheek. IMPRESSION: 1. No acute facial fracture. 2. Large hematoma centered over the left maxilla. 3. Associated left facial soft tissue swelling. Electronically signed by: Pinkie Pebbles MD 12/10/2023 08:37 PM EDT RP Workstation: [id number]   CT HEAD WO  CONTRAST Result Date: 12/10/2023 EXAM: CT HEAD WITHOUT CONTRAST 12/10/2023 07:59:53 PM TECHNIQUE: CT of the head was performed without the administration of intravenous contrast. Automated exposure control, iterative reconstruction, and/or weight based adjustment of the mA/kV was utilized to reduce the radiation dose to as low as reasonably achievable. COMPARISON: None available. CLINICAL HISTORY: Head trauma, moderate-severe. Fall when getting mail. FINDINGS: BRAIN AND VENTRICLES: Global cortical atrophy. Subcortical and periventricular small vessel ischemic changes. Intracranial atherosclerosis. No acute hemorrhage. No evidence of acute infarct. No hydrocephalus. No extra-axial collection. No mass effect or midline shift. ORBITS: No acute abnormality. SINUSES: No acute abnormality. SOFT TISSUES AND SKULL: No acute soft tissue abnormality. No skull fracture. Additional maxillofacial findings reported on dedicated CT. IMPRESSION: 1. No acute intracranial abnormality. 2. Additional maxillofacial findings reported on dedicated CT. Electronically signed by: Pinkie Pebbles MD 12/10/2023 08:33 PM EDT RP Workstation: [id number]   DG Hand Complete Right Result Date: 12/10/2023 CLINICAL DATA:  Status post fall with laceration to the thumb. EXAM: DG HAND COMPLETE 3+V*R* COMPARISON:  None Available. FINDINGS: There is no evidence of fracture or dislocation. Soft tissue thickening with skin irregularity about the thumb at the level of the interphalangeal joint  with suspected punctate radiopaque debris. Chronic arthropathy of the digits and thumb carpal metacarpal joint. The distal interphalangeal joint of the third digit has a Gull wing deformity. Chondrocalcinosis of the triangular fibrocartilage and proximal carpal row. IMPRESSION: 1. No acute fracture or dislocation. 2. Soft tissue thickening with skin irregularity about the thumb at the level of the interphalangeal joint with suspected punctate radiopaque debris.  3. Chronic arthropathy of the digits and thumb carpometacarpal joint. Gull wing deformity of the distal interphalangeal joint of the third digit, can be seen with erosive osteoarthritis. Electronically Signed   By: Andrea Gasman M.D.   On: 12/10/2023 20:16   DG Pelvis Portable Result Date: 12/10/2023 CLINICAL DATA:  Blunt trauma. EXAM: PORTABLE PELVIS 1-2 VIEWS COMPARISON:  None Available. FINDINGS: The bones are under mineralized. The cortical margins of the bony pelvis are intact. No fracture. Pubic symphysis and sacroiliac joints are congruent. Both femoral heads are well-seated in the respective acetabula. Mild bilateral hip osteoarthritis. Aorto bi-iliac stent graft. Peripheral vascular calcifications. IMPRESSION: 1. No fracture of the pelvis. 2. Mild bilateral hip osteoarthritis. Electronically Signed   By: Andrea Gasman M.D.   On: 12/10/2023 20:11   DG Chest Port 1 View Result Date: 12/10/2023 EXAM: 1 VIEW(S) XRAY OF THE CHEST 12/10/2023 07:52:00 PM COMPARISON: Chest x-ray 6621. CT chest abdomen and pelvis9/24/2025. CLINICAL HISTORY: Trauma. Reason for exam: Trauma /FOT; Laceration on right thumb. FINDINGS: LUNGS AND PLEURA: There are patchy airspace opacities in the left lung base. Small left pleural effusion. No pulmonary edema. No pneumothorax. HEART AND MEDIASTINUM: Left-sided pacemaker is present. The aortic arch appears dilated and calcified similar to prior study. Aorta is heavily calcified. Heart is mildly enlarged. BONES AND SOFT TISSUES: No acute osseous abnormality. IMPRESSION: 1. Patchy airspace opacities in the left lung base with small left pleural effusion. 2. Grossly unchanged dilated aorta. If there is high clinical concern for acute aortic pathology, recommend follow-up CT. Electronically signed by: Greig Pique MD 12/10/2023 08:10 PM EDT RP Workstation: [id number]     LOS: 2 days   Donalda Applebaum, MD  [facility]    To contact the attending provider between  7A-7P or the covering provider during after hours 7P-7A, please log into the web site [email or web address] and access using universal [place] password for that web site. If you do not have the password, please call the hospital operator.  12/12/2023, 10:18 AM

## 2023-12-12 NOTE — TOC CAGE-AID Note (Signed)
 Transition of Care [facility]) - CAGE-AID Screening   Patient Details  Name: Deborah Moses MRN: [id number] Date of Birth: 1931/07/14  [facility] CM/SW Contact:    Margurete Guaman E Deannie Resetar, LCSW Phone Number: 12/12/2023, 9:27 AM   Clinical Narrative: N/A - no SA noted.   CAGE-AID Screening: Substance Abuse Screening unable to be completed due to: : Patient unable to participate

## 2023-12-13 DIAGNOSIS — N179 Acute kidney failure, unspecified: Secondary | ICD-10-CM | POA: Diagnosis not present

## 2023-12-13 DIAGNOSIS — Z7189 Other specified counseling: Secondary | ICD-10-CM | POA: Diagnosis not present

## 2023-12-13 DIAGNOSIS — N185 Chronic kidney disease, stage 5: Secondary | ICD-10-CM | POA: Diagnosis not present

## 2023-12-13 DIAGNOSIS — Z515 Encounter for palliative care: Secondary | ICD-10-CM | POA: Diagnosis not present

## 2023-12-13 DIAGNOSIS — W19XXXA Unspecified fall, initial encounter: Secondary | ICD-10-CM | POA: Diagnosis not present

## 2023-12-13 LAB — HEPATITIS B SURFACE ANTIBODY, QUANTITATIVE: Hep B S AB Quant (Post): <3.5 m[IU]/mL — ABNORMAL LOW

## 2023-12-13 MED ORDER — CHLORHEXIDINE GLUCONATE CLOTH 2 % EX PADS
6.0000 | MEDICATED_PAD | Freq: Every day | CUTANEOUS | Status: DC
  Administered 2023-12-13: 6 via TOPICAL

## 2023-12-13 MED ORDER — SODIUM CHLORIDE 0.9% FLUSH
10.0000 mL | Freq: Two times a day (BID) | INTRAVENOUS | Status: DC
  Administered 2023-12-13 – 2023-12-14 (×2): 10 mL

## 2023-12-13 MED ORDER — SODIUM CHLORIDE 0.9% FLUSH: 10.0000 mL | INTRAVENOUS | Status: DC | PRN

## 2023-12-13 NOTE — Care Management Important Message (Signed)
 Important Message  Patient Details  Name: Deborah Moses MRN: [id number] Date of Birth: 1931-07-20   Important Message Given:  Yes - Medicare IM     [name] 12/13/2023, 1:47 PM

## 2023-12-13 NOTE — Plan of Care (Signed)
  Problem: Activity: Goal: Risk for activity intolerance will decrease Outcome: Progressing   Problem: Nutrition: Goal: Adequate nutrition will be maintained Outcome: Progressing   Problem: Coping: Goal: Level of anxiety will decrease Outcome: Progressing   Problem: Pain Managment: Goal: General experience of comfort will improve and/or be controlled Outcome: Progressing   Problem: Safety: Goal: Ability to remain free from injury will improve Outcome: Progressing

## 2023-12-13 NOTE — Progress Notes (Signed)
 R internal jugular trialysis catheter removed. Site unrekmarkable. Manual pressure held x20 minutes. Pt instructed to remain flat for  minutes. Family at bedside, aware. Dressing to remain dry and intact x24 hours.

## 2023-12-13 NOTE — TOC Progression Note (Addendum)
 Transition of Care [facility]) - Progression Note    Patient Details  Name: Deborah Moses MRN: [id number] Date of Birth: April 21, 1931  Transition of Care [facility]) CM/SW Contact  Inocente GORMAN Kindle, LCSW Phone Number: 12/13/2023, 11:59 AM  Clinical Narrative:    12pm-CSW updated [facility] that family has the preference for [facility]. They will conduct assessment.   3:11 PM-Per [facility], they are expecting a bed to open tomorrow for patient. CSW will follow.   Expected Discharge Plan: Home w Hospice Care Barriers to Discharge:  (hospice decision and DME delivery)               Expected Discharge Plan and Services In-house Referral: Clinical Social Work Discharge Planning Services: CM Consult Post Acute Care Choice: Hospice Living arrangements for the past 2 months: Single Family Home                                       Social Drivers of Health (SDOH) Interventions SDOH Screenings   Food Insecurity: No Food Insecurity (12/11/2023)  Housing: Low Risk  (12/11/2023)  Transportation Needs: No Transportation Needs (12/11/2023)  Utilities: Not At Risk (12/11/2023)  Social Connections: Moderately Isolated (12/11/2023)  Tobacco Use: Low Risk  (12/11/2023)    Readmission Risk Interventions     No data to display

## 2023-12-13 NOTE — Progress Notes (Signed)
 IV team consult placed for dressing change on patient's HD cath.  Blood under dressing per RN.  This RN unit to discuss with primary RN about a dressing change vs removal of line vs no interventions. Pt noted to be comfort care and per primary RN family at bedside waiting discussion with palliative and hospice.

## 2023-12-13 NOTE — Progress Notes (Signed)
 Daily Progress Note   Patient Name: Deborah Moses       Date: 12/13/2023 DOB: Feb 16, 1931  Age: 89 y.o. MRN#: [id number] Attending Physician: Raenelle Donalda HERO, MD Primary Care Physician: Orpha Yancey LABOR, MD Admit Date: 12/10/2023  Reason for Consultation/Follow-up: Establishing goals of care  Subjective: Medical records reviewed including progress notes, labs, imaging, MAR.  She received 1 dose of as needed Robinul and 1 dose of as needed Dilaudid  this morning.  Patient assessed at the bedside.  Pleasantly confused, denies pain or distress.  Discussed with RN.  Family request, we relocated to a separate conference room to discuss disposition for scheduled family meeting.  Created space and opportunity for patient family's thoughts and feelings on patient's current illness.  Emotional support and therapeutic listening was provided as they shared their difficulty coping with the reality of their inability to meet patient's wish to die at home.  We had discussion about the resources hospice could provide in different settings at length.  Patient's family initially were inclined to keep her in the hospital for as long as possible, then consider hospice closer to the very end of her life.  Counseled that she would be at risk of sudden decline and not being stable for transport, recommended consideration of hospice facility sooner rather than later if this is the ultimate goal.  After extensive review of the risks and benefits, patient's family elected to proceed with hospice referral today.  We also discussed recommendations to continue weaning oxygen as tolerated, as this would prolong the dying process rather than provide her comfort.  Questions and concerns addressed. PMT will continue to support holistically.   Length of Stay: 3   Physical  Exam Vitals and nursing note reviewed.  Constitutional:      General: She is not in acute distress.    Appearance: She is ill-appearing.  HENT:     Head: Normocephalic.  Cardiovascular:     Rate and Rhythm: Normal rate.  Pulmonary:     Effort: Pulmonary effort is normal.  Skin:    General: Skin is warm and dry.  Neurological:     Mental Status: She is alert.  Psychiatric:        Mood and Affect: Mood normal.        Behavior: Behavior normal.            Vital Signs: BP (!) 161/84 (BP Location: Right Arm)   Pulse 67   Temp 98.3 F (36.8 C) (Oral)   Resp 18   Ht 5' 2 (1.575 m)   Wt 57.8 kg   SpO2 92%  BMI 23.31 kg/m  SpO2: SpO2: 92 % O2 Device: O2 Device: High Flow Nasal Cannula O2 Flow Rate: O2 Flow Rate (L/min): 9 L/min     Palliative Care Assessment & Plan   Patient Profile: 89 y.o. female  with past medical history of CKD 4, type B dissection, AAA-s/p repair with endoleak, CHB s/p PPM was admitted on 12/10/2023 after ground level fall.  Left maxillary hematoma-found to have oliguric AKI on CKD 4 with severe hyperkalemia. Hospital course complicated by worsening hypoxemia requiring high flow oxygen. After discussions with attending, family opted for patient's transition to full comfort measures - discharge plan pending.   Assessment: Goals of care conversation End-of-life care AKI on CKD 4 Acute hypoxic respiratory failure Mechanical fall with left maxillary hematoma CHB status post PPM  Recommendations/Plan: Continue DNR/DNI Continue comfort focused care, no adjustments required today Goal is for transfer to [facility] [place] facility for end-of-life care.  Patient is currently stable.  TOC notified, assistance appreciated Psychosocial and emotional support provided PMT will continue to follow and support   Prognosis: Weeks  Discharge Planning: Hospice facility  Care plan was discussed with patient patient, patient's family, MD, RN, TOC           Kalvin Buss SHAUNNA Fell, PA-C  Palliative Medicine Team Team phone # [phone]  Thank you for allowing the Palliative Medicine Team to assist in the care of this patient. Please utilize secure chat with additional questions, if there is no response within 30 minutes please call the above phone number.  Palliative Medicine Team providers are available by phone from 7am to 7pm daily and can be reached through the team cell phone.  Should this patient require assistance outside of these hours, please call the patient's attending physician.    Time Total: 65  Visit consisted of counseling and education dealing with the complex and emotionally intense issues of symptom management and palliative care in the setting of serious and potentially life-threatening illness. Greater than 50% of this time was spent counseling and coordinating care related to the above assessment and plan.  Personally spent 65 minutes in patient care including extensive chart review (labs, imaging, progress/consult notes, vital signs), medically appropraite exam, discussed with treatment team, education to patient, family, and staff, documenting clinical information, medication review and management, coordination of care, and available advanced directive documents.

## 2023-12-13 NOTE — Progress Notes (Signed)
 PROGRESS NOTE        PATIENT DETAILS Name: Deborah Moses Age: 89 y.o. Sex: female Date of Birth: [birth year] Admit Date: 12/10/2023 Admitting Physician Dorn Dawson, MD ERE:Yjdjwjg, Yancey LABOR, MD  Brief Summary: Patient is a 89 y.o.  female with history of CKD 4, type B dissection, AAA-s/p repair with endoleak, CHB s/p PPM-who presented with a ground-level fall-left maxillary hematoma-found to have oliguric AKI on CKD 4 with severe hyperkalemia.  Hospital course complicated by worsening hypoxemia requiring high flow oxygen.  See below for further details.  Significant events: 10/26>> admit to [facility] with severe hyperkalemia-not responsive to standard care-after discussion with family-trial of hemodialysis.  Unfortunately-hemodialysis cut short due to hypotension/tachycardia. 10/27>> worsening hypoxemia-on 10-15 L of oxygen-secondary to pulmonary edema on chest x-ray-discussed with daughter by attending MD-plans are to transition to hospice care.  FiO2 decreased to 8-10 L.  Significant studies: 10/25>> CXR: Patchy airspace opacities 10/25>> x-ray pelvis: No fracture 10/25>> x-ray right hand: No acute/dislocation. 10/25>> CT head: No acute intracranial abnormality 10/25>> CT maxillofacial: No fracture-large hematoma over left maxilla. 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  Consults: Nephrology Palliative care  Subjective: Lethargic but awake.  Appears comfortable.  Son/daughter at bedside.  Objective: Vitals: Blood pressure (!) 161/84, pulse 67, temperature 98.3 F (36.8 C), temperature source Oral, resp. rate 18, height 5' 2  (1.575 m), weight 57.8 kg, SpO2 92%.   Exam: Awake-but very lethargic Not in any distress Continues to have significant ecchymosis over the left maxillary area. Extremities: No edema.   Pertinent Labs/Radiology:    Latest Ref Rng & Units 12/12/2023    5:46 AM 12/11/2023    3:10 AM 12/10/2023    7:57 PM  CBC  WBC 4.0 - 10.5 K/uL 7.2  8.8  9.6   Hemoglobin 12.0 - 15.0 g/dL 7.5  8.1  9.5   Hematocrit 36.0 - 46.0 % 23.0  25.6  29.9   Platelets 150 - 400 K/uL 228  263  323     Lab Results  Component Value Date   NA 135 12/12/2023   K 4.0 12/12/2023   CL 97 (L) 12/12/2023   CO2 25 12/12/2023      Assessment/Plan: Oliguric AKI on CKD 4 Did not tolerate trial of HD on 10/26-unfortunately developed pulm edema and on high flow oxygen No response to IV Lasix Has been transition to full comfort measures-family meeting later today to decide on appropriate disposition.   Palliative care team following.  Acute hypoxic respiratory failure Secondary to acute pulmonary edema in the setting of worsening AKI No response to high-dose IV Lasix-did not tolerate HD Remains on 8-9 L of HFNC Comfort measures ineffective Continue as needed Ativan/Dilaudid  Palliative care meeting with family later today-to decide appropriate disposition.  Mechanical fall with left maxillary hematoma Supportive care  History of complete heart block-s/p PPM Supportive care  PAF Beta-blocker No role for anticoagulation  History of type B aortic dissection History of AAA with endoleak-s/p stenting Supportive care  HTN Continue metoprolol  for now  Goals of care/comfort care DNR Oliguric AKI-did not tolerate HD-no response to high-dose IV  Lasix-now with pulm edema. Full comfort measures in effect-palliative to meet with family later today to decide appropriate disposition.  Code status:   Code Status: Do not attempt resuscitation (DNR) - Comfort care   DVT Prophylaxis:    Family Communication:  Daughter at bedside   Disposition Plan: Status is: Inpatient Remains inpatient appropriate because: Severity of illness   Planned Discharge Destination:Hospice care   Diet: Diet Order             Diet regular Fluid consistency: Thin  Diet effective now                     Antimicrobial agents: Anti-infectives (From admission, onward)    None        MEDICATIONS: Scheduled Meds:  amLODipine   5 mg Oral Daily   metoprolol  succinate  50 mg Oral Daily   Continuous Infusions:   PRN Meds:.acetaminophen  **OR** acetaminophen , antiseptic oral rinse, artificial tears, glycopyrrolate **OR** glycopyrrolate **OR** glycopyrrolate, haloperidol **OR** haloperidol **OR** haloperidol lactate, HYDROmorphone  HCl, LORazepam, LORazepam, ondansetron  **OR** ondansetron  (ZOFRAN ) IV   I have personally reviewed following labs and imaging studies  LABORATORY DATA: CBC: Recent Labs  Lab 12/10/23 1940 12/10/23 1957 12/11/23 0310 12/12/23 0546  WBC  --  9.6 8.8 7.2  NEUTROABS  --  7.2  --   --   HGB 9.9* 9.5* 8.1* 7.5*  HCT 29.0* 29.9* 25.6* 23.0*  MCV  --  94.3 94.1 92.4  PLT  --  323 263 228    Basic Metabolic Panel: Recent Labs  Lab 12/10/23 1940 12/10/23 1957 12/11/23 0310 12/11/23 0550 12/11/23 0952 12/11/23 1348 12/12/23 0546  NA 132* 133* 133*  134*  --   --  134* 135  K 6.7* 6.6* 6.7*  6.6* 5.8* 5.8* 6.1* 4.0  CL 109 104 105  105  --   --  105 97*  CO2  --  14* 15*  15*  --   --  16* 25  GLUCOSE 136* 136* 110*  113*  --   --  99 109*  BUN 86* 92* 88*  87*  --   --  87* 54*  CREATININE 8.10* 7.40* 7.02*  7.04*  --   --  7.33* 5.61*  CALCIUM   --  10.3 9.9  9.8  --   --  9.8 8.9  MG  --   --  2.4  --   --   --   --   PHOS  --   --  4.1  4.1  --   --   --  4.2    GFR: Estimated Creatinine Clearance: 5.3 mL/min (A) (by C-G formula based on SCr of 5.61 mg/dL (H)).  Liver Function Tests: Recent Labs  Lab 12/10/23 1957 12/11/23 0310 12/12/23 0546   AST 27  --   --   ALT 22  --   --   ALKPHOS 112  --   --   BILITOT 0.5  --   --   PROT 6.5  --   --   ALBUMIN 3.1* 2.7* 2.7*   No results for input(s): LIPASE, AMYLASE in the last 168 hours. No results for input(s): AMMONIA in the last 168 hours.  Coagulation Profile: No results for input(s): INR, PROTIME in the last 168 hours.  Cardiac Enzymes: Recent Labs  Lab 12/10/23 1957 12/11/23 0310  CKTOTAL 58 56    BNP (last 3 results) No results for input(s): PROBNP in the last 8760 hours.  Lipid Profile: No results for input(s): CHOL, HDL, LDLCALC, TRIG, CHOLHDL, LDLDIRECT in the last 72 hours.  Thyroid Function Tests: No results for input(s): TSH, T4TOTAL, FREET4, T3FREE, THYROIDAB in the last 72 hours.  Anemia Panel: No results for input(s): VITAMINB12, FOLATE, FERRITIN, TIBC, IRON, RETICCTPCT in the last 72 hours.  Urine analysis:    Component Value Date/Time   COLORURINE AMBER (A) 12/11/2023 0303   APPEARANCEUR CLOUDY (A) 12/11/2023 0303   LABSPEC 1.019 12/11/2023 0303   PHURINE 5.0 12/11/2023 0303   GLUCOSEU NEGATIVE 12/11/2023 0303   HGBUR NEGATIVE 12/11/2023 0303   BILIRUBINUR NEGATIVE 12/11/2023 0303   KETONESUR NEGATIVE 12/11/2023 0303   PROTEINUR 30 (A) 12/11/2023 0303   NITRITE NEGATIVE 12/11/2023 0303   LEUKOCYTESUR MODERATE (A) 12/11/2023 0303    Sepsis Labs: Lactic Acid, Venous    Component Value Date/Time   LATICACIDVEN 0.8 12/10/2023 1941    MICROBIOLOGY: No results found for this or any previous visit (from the past 240 hours).  RADIOLOGY STUDIES/RESULTS: DG CHEST PORT 1 VIEW Result Date: 12/12/2023 EXAM: 1 VIEW(S) XRAY OF THE CHEST 12/12/2023 12:32:00 AM COMPARISON: 12/11/2023 CLINICAL HISTORY: Hypoxia FINDINGS: LINES, TUBES AND DEVICES: Right IJ CVC in place with tip terminating over the SVC. LUNGS AND PLEURA: Mild to moderate interstitial edema, progressive. Small bilateral pleural effusions,  left greater than right. No focal pulmonary opacity. No pneumothorax. HEART AND MEDIASTINUM: Stable left subclavian pacemaker. Left chest cardiac pacing device noted. Stable thoracic aortic atherosclerosis with known aneurysm, better evaluated on CT. BONES AND SOFT TISSUES: No acute osseous abnormality. IMPRESSION: 1. Mild to moderate interstitial pulmonary edema, progressive. 2. Small bilateral pleural effusions, left greater than right. Electronically signed by: Pinkie Pebbles MD 12/12/2023 12:40 AM EDT RP Workstation: [id number]   DG Chest Port 1 View Result Date: 12/11/2023 EXAM: 1 VIEW(S) XRAY OF THE CHEST 12/11/2023 06:22:00 PM COMPARISON: Chest x-ray 03/12/2008 and 03/11/2008. CLINICAL HISTORY: FINDINGS: LINES, TUBES AND DEVICES: New right-sided central venous catheter with distal tip in the mid SVC. Left-sided pacemaker device/leads at the level of the arch, unchanged. LUNGS AND PLEURA: Small left pleural effusion. Left basilar infiltrates have not significantly changed. No other focal pulmonary opacity. No pulmonary edema. No pneumothorax. ker is again seen. HEART AND MEDIASTINUM: Heart is mildly enlarged, unchanged. There are severe atherosclerotic calcifications of the aorta. The aorta is. BONES AND SOFT TISSUES: No acute osseous abnormality. IMPRESSION: 1. New right-sided central venous catheter with distal tip in the mid SVC. 2. Small left pleural effusion and left basilar infiltrates, not significantly changed. Electronically signed by: Greig Pique MD 12/11/2023 06:55 PM EDT RP Workstation: [id number]     LOS: 3 days   Donalda Applebaum, MD  [facility]    To contact the attending provider between 7A-7P or the covering provider during after hours 7P-7A, please log into the web site [email or web address] and access using universal [place] password for that web site. If you do not have the password, please call the hospital operator.  12/13/2023, 10:35 AM

## 2023-12-14 DIAGNOSIS — R0600 Dyspnea, unspecified: Secondary | ICD-10-CM

## 2023-12-14 DIAGNOSIS — R0689 Other abnormalities of breathing: Secondary | ICD-10-CM

## 2023-12-14 DIAGNOSIS — N185 Chronic kidney disease, stage 5: Secondary | ICD-10-CM | POA: Diagnosis not present

## 2023-12-14 DIAGNOSIS — S0083XA Contusion of other part of head, initial encounter: Secondary | ICD-10-CM | POA: Diagnosis not present

## 2023-12-14 DIAGNOSIS — Z515 Encounter for palliative care: Secondary | ICD-10-CM | POA: Diagnosis not present

## 2023-12-14 DIAGNOSIS — N179 Acute kidney failure, unspecified: Secondary | ICD-10-CM | POA: Diagnosis not present

## 2023-12-14 DIAGNOSIS — S61011A Laceration without foreign body of right thumb without damage to nail, initial encounter: Secondary | ICD-10-CM | POA: Diagnosis not present

## 2023-12-14 DIAGNOSIS — W19XXXA Unspecified fall, initial encounter: Secondary | ICD-10-CM | POA: Diagnosis not present

## 2023-12-14 MED ORDER — LORAZEPAM 2 MG/ML PO CONC: 1.0000 mg | Freq: Four times a day (QID) | ORAL | Status: DC | PRN

## 2023-12-14 MED ORDER — HYDROMORPHONE HCL 1 MG/ML IJ SOLN: 0.5000 mg | INTRAMUSCULAR | Status: DC | PRN

## 2023-12-14 MED ORDER — HYDROMORPHONE HCL 1 MG/ML PO LIQD
1.0000 mg | Freq: Once | ORAL | Status: AC
  Administered 2023-12-14: 1 mg via ORAL
  Filled 2023-12-14: qty 1

## 2023-12-14 MED ORDER — HYDROMORPHONE HCL 1 MG/ML PO LIQD: 1.0000 mg | Freq: Four times a day (QID) | ORAL | Status: DC | PRN

## 2023-12-14 MED ORDER — GLYCOPYRROLATE 1 MG PO TABS: 1.0000 mg | ORAL_TABLET | ORAL | Status: DC | PRN

## 2023-12-14 MED ORDER — ONDANSETRON 4 MG PO TBDP: 4.0000 mg | ORAL_TABLET | Freq: Four times a day (QID) | ORAL | Status: DC | PRN

## 2023-12-14 NOTE — TOC Transition Note (Signed)
 Transition of Care [facility]) - Discharge Note   Patient Details  Name: Deborah Moses MRN: [id number] Date of Birth: 02/26/1931  Transition of Care [facility] CM/SW Contact:  Inocente GORMAN Kindle, LCSW Phone Number: 12/14/2023, 12:49 PM   Clinical Narrative:    Patient will DC to: [facility] Anticipated DC date: 12/14/23 Family notified: Daughter, Surveyor, [employer] Transport by: ROME goldman   Per MD patient ready for DC to [facility]. RN to call report prior to discharge [phone]). RN, patient, patient's family, and facility notified of DC. Discharge Summary sent to facility. DC packet on chart including signed DNR. Ambulance transport requested for patient.   CSW will sign off for now as social work intervention is no longer needed. Please consult us  again if new needs arise.     Final next level of care: Hospice Medical Facility Barriers to Discharge: Barriers Resolved   Patient Goals and CMS Choice Patient states their goals for this hospitalization and ongoing recovery are:: Comfort CMS Medicare.gov Compare Post Acute Care list provided to:: Patient Represenative (must comment) Choice offered to / list presented to : Adult Children [place] ownership interest in [facility].provided to:: Adult Children    Discharge Placement                Patient to be transferred to facility by: [facility]   Patient and family notified of of transfer: 12/14/23  Discharge Plan and Services Additional resources added to the After Visit Summary for   In-house Referral: Clinical Social Work Discharge Planning Services: CM Consult Post Acute Care Choice: Hospice                               Social Drivers of Health (SDOH) Interventions SDOH Screenings   Food Insecurity: No Food Insecurity (12/11/2023)  Housing: Low Risk  (12/11/2023)  Transportation Needs: No Transportation Needs (12/11/2023)  Utilities: Not At Risk (12/11/2023)  Social Connections: Moderately Isolated  (12/11/2023)  Tobacco Use: Low Risk  (12/11/2023)     Readmission Risk Interventions     No data to display

## 2023-12-14 NOTE — Discharge Summary (Signed)
 PATIENT DETAILS Name: Deborah Moses Age: 89 y.o. Sex: female Date of Birth: 06-Feb-1932 MRN: [id number]. Admitting Physician: Dorn Dawson, MD ERE:[id number], Yancey LABOR, MD  Admit Date: 12/10/2023 Discharge date: 12/14/2023  Recommendations for Outpatient Follow-up:  Optimize comfort measures  Admitted From:  Home  Disposition: Hospice care   Discharge Condition: fair  CODE STATUS:   Code Status: Do not attempt resuscitation (DNR) - Comfort care   Diet recommendation:  Diet Order             Diet general           Diet regular Fluid consistency: Thin  Diet effective now                    Brief Summary: Patient is a 89 y.o.  female with history of CKD 4, type B dissection, AAA-s/p repair with endoleak, CHB s/p PPM-who presented with a ground-level fall-left maxillary hematoma-found to have oliguric AKI on CKD 4 with severe hyperkalemia.  Hospital course complicated by worsening hypoxemia requiring high flow oxygen.  See below for further details.   Significant events: 10/26>> admit to [facility]I with severe hyperkalemia-not responsive to standard care-after discussion with family-trial of hemodialysis.  Unfortunately-hemodialysis cut short due to hypotension/tachycardia. 10/27>> worsening hypoxemia-on 10-15 L of oxygen-secondary to pulmonary edema on chest x-ray-discussed with daughter by attending MD-plans are to transition to hospice care.  FiO2 decreased to 8-10 L.   Significant studies: 10/25>> CXR: Patchy airspace opacities 10/25>> x-ray pelvis: No fracture 10/25>> x-ray right hand: No acute/dislocation. 10/25>> CT head: No acute intracranial abnormality 10/25>> CT maxillofacial: No fracture-large hematoma over left maxilla. 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   Consults: Nephrology Palliative care  Brief Hospital Course: Oliguric AKI on CKD 4 Did not tolerate trial of HD on 10/26-unfortunately developed pulm edema and on high flow oxygen No response to IV Lasix Remains uric for the past 3-4 days. Has been transitioned to full comfort measures Remain stable to be discharged to residential hospice once bed available.   Acute hypoxic respiratory failure Secondary to acute pulmonary edema in the setting of worsening AKI No response to high-dose IV Lasix-did not tolerate HD Remains on 8-9 L of HFNC Comfort measures ineffective Continue as needed Ativan/Dilaudid    Mechanical fall with left maxillary hematoma Supportive care   History of complete heart block-s/p PPM Supportive care   PAF Beta-blocker No role for anticoagulation   History of type B aortic dissection History of AAA with endoleak-s/p stenting Supportive care   HTN Continue metoprolol  for now   Goals of care/comfort care DNR Oliguric AKI-did not tolerate HD-no response to high-dose IV Lasix-now with pulm edema.  No other options apart from comfort measures at this point Plans are to discharge residential hospice when bed available.   Discharge Diagnoses:  Principal Problem:   Renal failure   Discharge Instructions:  Activity:  As tolerated with Full fall precautions use walker/cane & assistance as needed   Discharge Instructions     Diet general   Complete by: As directed    Discharge instructions   Complete by: As directed    Follow with Primary MD  Orpha Yancey LABOR, MD in 1-2 weeks  Please get a  complete blood count and chemistry panel checked by your Primary MD at your next visit, and again as instructed by your Primary MD.  Get Medicines reviewed and adjusted: Please take all your medications with you for your next  visit with your Primary MD  Laboratory/radiological data: Please request your Primary MD to go over all hospital tests and procedure/radiological results at the follow up, please ask your Primary MD to get all Hospital records sent to his/her office.  In some cases, they will be blood work, cultures and biopsy results pending at the time of your discharge. Please request that your primary care M.D. follows up on these results.  Also Note the following: If you experience worsening of your admission symptoms, develop shortness of breath, life threatening emergency, suicidal or homicidal thoughts you must seek medical attention immediately by calling 911 or calling your MD immediately  if symptoms less severe.  You must read complete instructions/literature along with all the possible adverse reactions/side effects for all the Medicines you take and that have been prescribed to you. Take any new Medicines after you have completely understood and accpet all the possible adverse reactions/side effects.   Do not drive when taking Pain medications or sleeping medications (Benzodaizepines)  Do not take more than prescribed Pain, Sleep and Anxiety Medications. It is not advisable to combine anxiety,sleep and pain medications without talking with your primary care practitioner  Special Instructions: If you have smoked or chewed Tobacco  in the last 2 yrs please stop smoking, stop any regular Alcohol  and or any Recreational drug use.  Wear Seat belts while driving.  Please note: You were cared for by a hospitalist during your hospital stay. Once you are discharged, your primary care physician will handle any further medical issues. Please note that NO REFILLS for any discharge medications will be authorized once you are discharged, as it is imperative that you return to your primary care physician (or establish a relationship with a primary care physician if you do not have one) for your post hospital  discharge needs so that they can reassess your need for medications and monitor your lab values.   Increase activity slowly   Complete by: As directed    No wound care   Complete by: As directed       Allergies as of 12/14/2023   No Known Allergies      Medication List     STOP taking these medications    acetaminophen  500 MG tablet Commonly known as: TYLENOL    alendronate 70 MG tablet Commonly known as: FOSAMAX   amLODipine  5 MG tablet Commonly known as: NORVASC    apixaban  2.5 MG Tabs tablet Commonly known as: Eliquis    calcium -vitamin D  500-200 MG-UNIT Tabs tablet Commonly known as: OSCAL WITH D   digoxin 0.125 MG tablet Commonly known as: LANOXIN   famotidine 20 MG tablet Commonly known as: PEPCID   Fish Oil 1000 MG Cpdr   IRON PO   lisinopril  20 MG tablet Commonly known as: ZESTRIL    lovastatin 20 MG tablet Commonly known as: MEVACOR   olmesartan 20 MG tablet Commonly known as: BENICAR   QC TUMERIC COMPLEX PO       TAKE these medications    glycopyrrolate 1 MG tablet Commonly known as: ROBINUL Take 1 tablet (1 mg total) by mouth every 4 (four) hours as needed (excessive secretions).   HYDROmorphone  HCl 1 MG/ML Liqd Commonly known as: DILAUDID  Take 1 mL (1 mg total) by mouth  every 6 (six) hours as needed for severe pain (pain score 7-10) (shortness of breath, comfort care).   LORazepam 2 MG/ML concentrated solution Commonly known as: ATIVAN Take 0.5 mLs (1 mg total) by mouth every 6 (six) hours as needed for anxiety, sleep or sedation.   metoprolol  succinate 50 MG 24 hr tablet Commonly known as: TOPROL -XL Take 1 tablet (50 mg total) by mouth daily.   ondansetron  4 MG disintegrating tablet Commonly known as: ZOFRAN -ODT Take 1 tablet (4 mg total) by mouth every 6 (six) hours as needed for nausea.        Follow-up Information     Orpha Yancey LABOR, MD. Schedule an appointment as soon as possible for a visit.   Specialty: Internal  Medicine Why: As needed Contact information: [address] [place] [address] [phone]                No Known Allergies   Other Procedures/Studies: DG CHEST PORT 1 VIEW Result Date: 12/12/2023 EXAM: 1 VIEW(S) XRAY OF THE CHEST 12/12/2023 12:32:00 AM COMPARISON: 12/11/2023 CLINICAL HISTORY: Hypoxia FINDINGS: LINES, TUBES AND DEVICES: Right IJ CVC in place with tip terminating over the SVC. LUNGS AND PLEURA: Mild to moderate interstitial edema, progressive. Small bilateral pleural effusions, left greater than right. No focal pulmonary opacity. No pneumothorax. HEART AND MEDIASTINUM: Stable left subclavian pacemaker. Left chest cardiac pacing device noted. Stable thoracic aortic atherosclerosis with known aneurysm, better evaluated on CT. BONES AND SOFT TISSUES: No acute osseous abnormality. IMPRESSION: 1. Mild to moderate interstitial pulmonary edema, progressive. 2. Small bilateral pleural effusions, left greater than right. Electronically signed by: Pinkie Pebbles MD 12/12/2023 12:40 AM EDT RP Workstation: [id number]   DG Chest Port 1 View Result Date: 12/11/2023 EXAM: 1 VIEW(S) XRAY OF THE CHEST 12/11/2023 06:22:00 PM COMPARISON: Chest x-ray 03/12/2008 and 03/11/2008. CLINICAL HISTORY: FINDINGS: LINES, TUBES AND DEVICES: New right-sided central venous catheter with distal tip in the mid SVC. Left-sided pacemaker device/leads at the level of the arch, unchanged. LUNGS AND PLEURA: Small left pleural effusion. Left basilar infiltrates have not significantly changed. No other focal pulmonary opacity. No pulmonary edema. No pneumothorax. ker is again seen. HEART AND MEDIASTINUM: Heart is mildly enlarged, unchanged. There are severe atherosclerotic calcifications of the aorta. The aorta is. BONES AND SOFT TISSUES: No acute osseous abnormality. IMPRESSION: 1. New right-sided central venous catheter with distal tip in the mid SVC. 2. Small left pleural effusion and left basilar  infiltrates, not significantly changed. Electronically signed by: Greig Pique MD 12/11/2023 06:55 PM EDT RP Workstation: [id number]   US  RENAL Result Date: 12/11/2023 EXAM: US  Retroperitoneum Complete, Renal. CLINICAL HISTORY: 89 year old female with acute kidney injury (AKI). TECHNIQUE: Real-time ultrasound of the retroperitoneum (complete) with image documentation. COMPARISON: Non-contrast CT chest, abdomen, and pelvis 12/10/2023. FINDINGS: RIGHT KIDNEY: Right kidney measures 6.4 x 2.7 x 3.2 cm, 29 ml. Renal cortical atrophy and increased cortical echogenicity, greater on this side. No hydronephrosis, renal stone, or solid renal mass visualized. LEFT KIDNEY: Left kidney measures 7.8 x 3.3 x 3.4 cm, 45 ml. Renal cortical atrophy and increased cortical echogenicity. No hydronephrosis, renal stone, or solid mass visualized. BLADDER: Urinary bladder decompressed with foley catheter balloon visible. PERIHEPATIC REGION: Trace perihepatic ascites suspected (series 1 image 30). IMPRESSION: 1. Renal atrophy. No acute findings. 2. Trace perihepatic ascites. Electronically signed by: Helayne Hurst MD 12/11/2023 09:12 AM EDT RP Workstation: [id number]   DG CHEST PORT 1 VIEW Result Date: 12/11/2023 EXAM: 1 VIEW XRAY OF THE  CHEST 12/11/2023 06:02:07 AM COMPARISON: 12/10/2022 CLINICAL HISTORY: [id number] Acute hypoxic respiratory failure (HCC) [id number]. FINDINGS: LUNGS AND PLEURA: Bilateral pleural effusions. Interstitial edema. Persistent retrocardiac opacification in the left base, which may reflect atelectasis or airspace disease. No pneumothorax. HEART AND MEDIASTINUM: Stable left chest wall pacer. Aortic atherosclerosis unchanged. Appearance of thoracic aortic aneurysm unchanged. BONES AND SOFT TISSUES: No acute osseous abnormality. IMPRESSION: 1. Persistent retrocardiac opacification in the left base, possibly atelectasis or airspace disease. 2. No change in bilateral pleural effusions and interstitial edema. 3.  Thoracic aortic aneurysm refer to CT chest report from 12/10/2023. Electronically signed by: Waddell Calk MD 12/11/2023 06:08 AM EDT RP Workstation: [id number]   CT CHEST ABDOMEN PELVIS WO CONTRAST Result Date: 12/10/2023 EXAM: CT CHEST, ABDOMEN AND PELVIS WITHOUT CONTRAST 12/10/2023 07:59:53 PM TECHNIQUE: CT of the chest, abdomen and pelvis was performed without the administration of intravenous contrast. Multiplanar reformatted images are provided for review. Automated exposure control, iterative reconstruction, and/or weight based adjustment of the mA/kV was utilized to reduce the radiation dose to as low as reasonably achievable. COMPARISON: 11/09/2023. CLINICAL HISTORY: Fall FINDINGS: CHEST: MEDIASTINUM AND LYMPH NODES: Mild cardiomegaly. Moderate 3 vessel coronary artery disease. The central airways are clear. No mediastinal, hilar or axillary lymphadenopathy. Stable appearance of chronic dissection of the thoracic aortic arch with aneurysmal dilatation measuring up to 5.7 cm (image 20) and hyperdense intramural hematoma extending along the descending thoracic aorta (image 26). Additional intramural hematoma just above the aortic hiatus (image 49), unchanged. 4.1 cm descending thoracic aortic aneurysm at the aortic hiatus, unchanged. LUNGS AND PLEURA: Small bilateral pleural effusions. No pneumothorax. Mild patchy bilateral lower lobe opacities, likely atelectasis. Mild patchy ground-glass opacities in the right upper lobe (image 51), possibly mild interstitial edema, although infection/pneumonia is not excluded. ABDOMEN AND PELVIS: LIVER: The liver is unremarkable. GALLBLADDER AND BILE DUCTS: Status post cholecystectomy. No biliary ductal dilatation. SPLEEN: No acute abnormality. PANCREAS: No acute abnormality. ADRENAL GLANDS: No acute abnormality. KIDNEYS, URETERS AND BLADDER: Two small hyperdense/hemorrhagic left renal cysts. Atrophic bilateral kidneys. No stones in the kidneys or ureters. No  hydronephrosis. No perinephric or periureteral stranding. Urinary bladder is unremarkable. GI AND BOWEL: Stomach demonstrates no acute abnormality. Diffuse colonic diverticulosis. There is no bowel obstruction. REPRODUCTIVE ORGANS: Status post hysterectomy. PERITONEUM AND RETROPERITONEUM: Trace simple free fluid within pelvis. No free air. VASCULATURE: Aortobiiliac stent graft with stable 5.9 cm infrarenal aortic aneurysm (image 66). ABDOMINAL AND PELVIS LYMPH NODES: No lymphadenopathy. BONES AND SOFT TISSUES: Left chest wall pacemaker noted. No acute osseous abnormality. No focal soft tissue abnormality. IMPRESSION: 1. No traumatic injury to the chest, abdomen, or pelvis. 2. Stable chronic dissection of the thoracic aortic arch with aneurysmal dilatation measuring up to 5.7 cm. Additional descending thoracic aortic findings as above. 3. Stable 5.9 cm infrarenal abdominal aortic aneurysm with aortobiiliac stent graft. 4. Mild patchy ground-glass opacities in the right upper lobe, possibly mild interstitial edema, although infection or pneumonia is not excluded. 5. Small bilateral pleural effusions. Electronically signed by: Pinkie Pebbles MD 12/10/2023 08:49 PM EDT RP Workstation: [id number]   CT CERVICAL SPINE WO CONTRAST Result Date: 12/10/2023 EXAM: CT CERVICAL SPINE WITHOUT CONTRAST 12/10/2023 07:59:53 PM TECHNIQUE: CT of the cervical spine was performed without the administration of intravenous contrast. Multiplanar reformatted images are provided for review. Automated exposure control, iterative reconstruction, and/or weight based adjustment of the mA/kV was utilized to reduce the radiation dose to as low as reasonably achievable. COMPARISON: None available. CLINICAL HISTORY: Polytrauma, blunt. Fall when  getting mail. FINDINGS: BONES AND ALIGNMENT: No acute fracture or traumatic malalignment. DEGENERATIVE CHANGES: No significant degenerative changes. SOFT TISSUES: No prevertebral soft tissue swelling.  IMPRESSION: 1. No acute abnormality of the cervical spine. Electronically signed by: Pinkie Pebbles MD 12/10/2023 08:38 PM EDT RP Workstation: [id number]   CT MAXILLOFACIAL WO CONTRAST Result Date: 12/10/2023 EXAM: CT OF THE FACE WITHOUT CONTRAST 12/10/2023 07:59:53 PM TECHNIQUE: CT of the face was performed without the administration of intravenous contrast. Multiplanar reformatted images are provided for review. Automated exposure control, iterative reconstruction, and/or weight based adjustment of the mA/kV was utilized to reduce the radiation dose to as low as reasonably achievable. COMPARISON: None available. CLINICAL HISTORY: Facial trauma, blunt. Fall when getting mail. FINDINGS: FACIAL BONES: No acute facial fracture. No mandibular dislocation. No suspicious bone lesion. ORBITS: Globes are intact. No acute traumatic injury. No inflammatory change. SINUSES AND MASTOIDS: Chronic opacification of the right maxillary sinus. Mastoid air cells are clear. SOFT TISSUES: Large hematoma centered over the left maxilla (image 38). Associated with left facial soft tissue swelling extending from the frontal region to the left cheek. IMPRESSION: 1. No acute facial fracture. 2. Large hematoma centered over the left maxilla. 3. Associated left facial soft tissue swelling. Electronically signed by: Pinkie Pebbles MD 12/10/2023 08:37 PM EDT RP Workstation: [id number]   CT HEAD WO CONTRAST Result Date: 12/10/2023 EXAM: CT HEAD WITHOUT CONTRAST 12/10/2023 07:59:53 PM TECHNIQUE: CT of the head was performed without the administration of intravenous contrast. Automated exposure control, iterative reconstruction, and/or weight based adjustment of the mA/kV was utilized to reduce the radiation dose to as low as reasonably achievable. COMPARISON: None available. CLINICAL HISTORY: Head trauma, moderate-severe. Fall when getting mail. FINDINGS: BRAIN AND VENTRICLES: Global cortical atrophy. Subcortical and periventricular  small vessel ischemic changes. Intracranial atherosclerosis. No acute hemorrhage. No evidence of acute infarct. No hydrocephalus. No extra-axial collection. No mass effect or midline shift. ORBITS: No acute abnormality. SINUSES: No acute abnormality. SOFT TISSUES AND SKULL: No acute soft tissue abnormality. No skull fracture. Additional maxillofacial findings reported on dedicated CT. IMPRESSION: 1. No acute intracranial abnormality. 2. Additional maxillofacial findings reported on dedicated CT. Electronically signed by: Pinkie Pebbles MD 12/10/2023 08:33 PM EDT RP Workstation: [id number]   DG Hand Complete Right Result Date: 12/10/2023 CLINICAL DATA:  Status post fall with laceration to the thumb. EXAM: DG HAND COMPLETE 3+V*R* COMPARISON:  None Available. FINDINGS: There is no evidence of fracture or dislocation. Soft tissue thickening with skin irregularity about the thumb at the level of the interphalangeal joint with suspected punctate radiopaque debris. Chronic arthropathy of the digits and thumb carpal metacarpal joint. The distal interphalangeal joint of the third digit has a Gull wing deformity. Chondrocalcinosis of the triangular fibrocartilage and proximal carpal row. IMPRESSION: 1. No acute fracture or dislocation. 2. Soft tissue thickening with skin irregularity about the thumb at the level of the interphalangeal joint with suspected punctate radiopaque debris. 3. Chronic arthropathy of the digits and thumb carpometacarpal joint. Gull wing deformity of the distal interphalangeal joint of the third digit, can be seen with erosive osteoarthritis. Electronically Signed   By: Andrea Gasman M.D.   On: 12/10/2023 20:16   DG Pelvis Portable Result Date: 12/10/2023 CLINICAL DATA:  Blunt trauma. EXAM: PORTABLE PELVIS 1-2 VIEWS COMPARISON:  None Available. FINDINGS: The bones are under mineralized. The cortical margins of the bony pelvis are intact. No fracture. Pubic symphysis and sacroiliac joints  are congruent. Both femoral heads are well-seated in the respective acetabula.  Mild bilateral hip osteoarthritis. Aorto bi-iliac stent graft. Peripheral vascular calcifications. IMPRESSION: 1. No fracture of the pelvis. 2. Mild bilateral hip osteoarthritis. Electronically Signed   By: Andrea Gasman M.D.   On: 12/10/2023 20:11   DG Chest Port 1 View Result Date: 12/10/2023 EXAM: 1 VIEW(S) XRAY OF THE CHEST 12/10/2023 07:52:00 PM COMPARISON: Chest x-ray 6621. CT chest abdomen and pelvis9/24/2025. CLINICAL HISTORY: Trauma. Reason for exam: Trauma /FOT; Laceration on right thumb. FINDINGS: LUNGS AND PLEURA: There are patchy airspace opacities in the left lung base. Small left pleural effusion. No pulmonary edema. No pneumothorax. HEART AND MEDIASTINUM: Left-sided pacemaker is present. The aortic arch appears dilated and calcified similar to prior study. Aorta is heavily calcified. Heart is mildly enlarged. BONES AND SOFT TISSUES: No acute osseous abnormality. IMPRESSION: 1. Patchy airspace opacities in the left lung base with small left pleural effusion. 2. Grossly unchanged dilated aorta. If there is high clinical concern for acute aortic pathology, recommend follow-up CT. Electronically signed by: Greig Pique MD 12/10/2023 08:10 PM EDT RP Workstation: [id number]   CUP PACEART INCLINIC DEVICE CHECK Result Date: 11/17/2023 Normal in-clinic dual chamber pacemaker check. Presenting Rhythm: AS/VS-VP 71 . Routine testing of thresholds, sensing, and impedance demonstrate stable parameters and no programming changes needed at this time. AF burden 1.6%. Estimated longevity 2.7 years. Pt enrolled in remote follow-up.Randall Sharps, BSN, RN    TODAY-DAY OF DISCHARGE:  Subjective:   Deborah Moses today has no headache,no chest abdominal pain,no new weakness tingling or numbness, feels much better wants to go home today.   Objective:   Blood pressure (!) 156/89, pulse 70, temperature 98.3 F (36.8 C),  temperature source Oral, resp. rate 18, height 5' 2 (1.575 m), weight 57.8 kg, SpO2 92%.  Intake/Output Summary (Last 24 hours) at 12/14/2023 0844 Last data filed at 12/13/2023 2000 Gross per 24 hour  Intake --  Output 200 ml  Net -200 ml   Filed Weights   12/10/23 2015 12/11/23 2110 12/11/23 2115  Weight: 47.6 kg 57.8 kg 57.8 kg    Exam: Awake Alert, Oriented *3, No new F.N deficits, Normal affect [place].AT,PERRAL Supple Neck,No JVD, No cervical lymphadenopathy appriciated.  Symmetrical Chest wall movement, Good air movement bilaterally, CTAB RRR,No Gallops,Rubs or new Murmurs, No Parasternal Heave +ve B.Sounds, Abd Soft, Non tender, No organomegaly appriciated, No rebound -guarding or rigidity. No Cyanosis, Clubbing or edema, No new Rash or bruise   PERTINENT RADIOLOGIC STUDIES: No results found.   PERTINENT LAB RESULTS: CBC: Recent Labs    12/12/23 0546  WBC 7.2  HGB 7.5*  HCT 23.0*  PLT 228   CMET CMP     Component Value Date/Time   NA 135 12/12/2023 0546   K 4.0 12/12/2023 0546   CL 97 (L) 12/12/2023 0546   CO2 25 12/12/2023 0546   GLUCOSE 109 (H) 12/12/2023 0546   BUN 54 (H) 12/12/2023 0546   CREATININE 5.61 (H) 12/12/2023 0546   CALCIUM  8.9 12/12/2023 0546   PROT 6.5 12/10/2023 1957   ALBUMIN 2.7 (L) 12/12/2023 0546   AST 27 12/10/2023 1957   ALT 22 12/10/2023 1957   ALKPHOS 112 12/10/2023 1957   BILITOT 0.5 12/10/2023 1957   GFRNONAA 7 (L) 12/12/2023 0546    GFR Estimated Creatinine Clearance: 5.3 mL/min (A) (by C-G formula based on SCr of 5.61 mg/dL (H)). No results for input(s): LIPASE, AMYLASE in the last 72 hours. No results for input(s): CKTOTAL, CKMB, CKMBINDEX, TROPONINI in the last 72 hours. Invalid input(s):  POCBNP No results for input(s): DDIMER in the last 72 hours. No results for input(s): HGBA1C in the last 72 hours. No results for input(s): CHOL, HDL, LDLCALC, TRIG, CHOLHDL, LDLDIRECT in the last 72  hours. No results for input(s): TSH, T4TOTAL, T3FREE, THYROIDAB in the last 72 hours.  Invalid input(s): FREET3 No results for input(s): VITAMINB12, FOLATE, FERRITIN, TIBC, IRON, RETICCTPCT in the last 72 hours. Coags: No results for input(s): INR in the last 72 hours.  Invalid input(s): PT Microbiology: No results found for this or any previous visit (from the past 240 hours).  FURTHER DISCHARGE INSTRUCTIONS:  Get Medicines reviewed and adjusted: Please take all your medications with you for your next visit with your Primary MD  Laboratory/radiological data: Please request your Primary MD to go over all hospital tests and procedure/radiological results at the follow up, please ask your Primary MD to get all Hospital records sent to his/her office.  In some cases, they will be blood work, cultures and biopsy results pending at the time of your discharge. Please request that your primary care M.D. goes through all the records of your hospital data and follows up on these results.  Also Note the following: If you experience worsening of your admission symptoms, develop shortness of breath, life threatening emergency, suicidal or homicidal thoughts you must seek medical attention immediately by calling 911 or calling your MD immediately  if symptoms less severe.  You must read complete instructions/literature along with all the possible adverse reactions/side effects for all the Medicines you take and that have been prescribed to you. Take any new Medicines after you have completely understood and accpet all the possible adverse reactions/side effects.   Do not drive when taking Pain medications or sleeping medications (Benzodaizepines)  Do not take more than prescribed Pain, Sleep and Anxiety Medications. It is not advisable to combine anxiety,sleep and pain medications without talking with your primary care practitioner  Special Instructions: If you have smoked  or chewed Tobacco  in the last 2 yrs please stop smoking, stop any regular Alcohol  and or any Recreational drug use.  Wear Seat belts while driving.  Please note: You were cared for by a hospitalist during your hospital stay. Once you are discharged, your primary care physician will handle any further medical issues. Please note that NO REFILLS for any discharge medications will be authorized once you are discharged, as it is imperative that you return to your primary care physician (or establish a relationship with a primary care physician if you do not have one) for your post hospital discharge needs so that they can reassess your need for medications and monitor your lab values.  Total Time spent coordinating discharge including counseling, education and face to face time equals greater than 30 minutes.  SignedBETHA Donalda Applebaum 12/14/2023 8:44 AM

## 2023-12-14 NOTE — Progress Notes (Signed)
 Daily Progress Note   Patient Name: Deborah Moses       Date: 12/14/2023 DOB: [birth year]  Age: 89 y.o. MRN#: [id number] Attending Physician: Raenelle Donalda HERO, MD Primary Care Physician: Orpha Yancey LABOR, MD Admit Date: 12/10/2023  Reason for Consultation/Follow-up: Establishing goals of care  Subjective: Medical records reviewed including progress notes, MAR.  She received 2 doses of as needed Dilaudid  in the past 24 hours.  Patient assessed at the bedside. She reports feeling fine today. Her daughter and another visitor are present. Discussed with RN. Will attempt to wean O2 and provide PRN medications for dyspnea/air hunger. Provided additional education to patient and family. They anticipate transfer to residential hospice as early as today and as late as Friday.  Questions and concerns addressed. PMT will continue to support holistically.   Length of Stay: 4   Physical Exam Vitals and nursing note reviewed.  Constitutional:      General: She is not in acute distress.    Appearance: She is ill-appearing.  HENT:     Head: Normocephalic.  Cardiovascular:     Rate and Rhythm: Normal rate.  Pulmonary:     Effort: Pulmonary effort is normal.  Skin:    General: Skin is warm and dry.  Neurological:     Mental Status: She is alert.  Psychiatric:        Mood and Affect: Mood normal.        Behavior: Behavior normal.            Vital Signs: BP (!) 156/89 (BP Location: Right Arm)   Pulse 70   Temp 98.3 F (36.8 C) (Oral)   Resp 18   Ht 5' 2 (1.575 m)   Wt 57.8 kg   SpO2 92%   BMI 23.31 kg/m  SpO2: SpO2: 92 % O2 Device: O2 Device: Nasal Cannula O2 Flow Rate: O2 Flow Rate (L/min): 9 L/min     Palliative Care Assessment & Plan   Patient Profile: 89 y.o. female  with past medical history of CKD 4, type B dissection,  AAA-s/p repair with endoleak, CHB s/p PPM was admitted on 12/10/2023 after ground level fall.  Left maxillary hematoma-found to have oliguric AKI on CKD 4 with severe hyperkalemia. Hospital course complicated by worsening hypoxemia requiring high flow oxygen. After discussions with attending, family opted for patient's transition to full comfort measures - discharge plan pending.   Assessment: Goals of care conversation End-of-life care AKI on CKD 4 Acute hypoxic respiratory failure Mechanical fall with left maxillary hematoma CHB status post PPM  Recommendations/Plan: Continue DNR/DNI Continue comfort focused care Ordered PRN  IV Dilaudid  to assist with weaning O2 as tolerated Transfer to hospice of [place] facility for end-of-life care  Psychosocial and emotional support provided PMT will continue to follow and support   Prognosis: Weeks  Discharge Planning: Hospice facility  Care plan was discussed with patient, patient's family, RN          Deborah Tri SHAUNNA Fell, PA-C  Palliative Medicine Team Team phone # [phone]  Thank you for allowing the Palliative Medicine Team to assist in the care of this patient. Please utilize secure chat with additional questions, if there is no response within 30 minutes please call the above phone number.  Palliative Medicine Team providers are available by phone from 7am to 7pm daily and can be reached through the team cell phone.  Should this patient require assistance outside of these hours, please call the patient's attending physician.

## 2023-12-14 NOTE — Progress Notes (Signed)
 Attempted to call report to [employer], nurse not available at this time. Left call back number with staff member.

## 2023-12-14 NOTE — TOC Progression Note (Signed)
 Transition of Care [facility]) - Progression Note    Patient Details  Name: Deborah Moses MRN: [id number] Date of Birth: [birth year]  Transition of Care [facility]) CM/SW Contact  Inocente GORMAN Kindle, LCSW Phone Number: 12/14/2023, 10:56 AM  Clinical Narrative:    [facility] able to accept patient today after 3pm. Will schedule transportation.    Expected Discharge Plan: Home w Hospice Care Barriers to Discharge: Barriers Resolved               Expected Discharge Plan and Services In-house Referral: Clinical Social Work Discharge Planning Services: CM Consult Post Acute Care Choice: Hospice Living arrangements for the past 2 months: Single Family Home Expected Discharge Date: 12/14/23                                     Social Drivers of Health (SDOH) Interventions SDOH Screenings   Food Insecurity: No Food Insecurity (12/11/2023)  Housing: Low Risk  (12/11/2023)  Transportation Needs: No Transportation Needs (12/11/2023)  Utilities: Not At Risk (12/11/2023)  Social Connections: Moderately Isolated (12/11/2023)  Tobacco Use: Low Risk  (12/11/2023)    Readmission Risk Interventions     No data to display

## 2023-12-14 NOTE — Progress Notes (Signed)
 PROGRESS NOTE        PATIENT DETAILS Name: Deborah Moses Age: 89 y.o. Sex: female Date of Birth: 1931-12-18 Admit Date: 12/10/2023 Admitting Physician Dorn Dawson, MD ERE:Yjdjwjg, Yancey LABOR, MD  Brief Summary: Patient is a 89 y.o.  female with history of CKD 4, type B dissection, AAA-s/p repair with endoleak, CHB s/p PPM-who presented with a ground-level fall-left maxillary hematoma-found to have oliguric AKI on CKD 4 with severe hyperkalemia.  Hospital course complicated by worsening hypoxemia requiring high flow oxygen.  See below for further details.  Significant events: 10/26>> admit to [facility] with severe hyperkalemia-not responsive to standard care-after discussion with family-trial of hemodialysis.  Unfortunately-hemodialysis cut short due to hypotension/tachycardia. 10/27>> worsening hypoxemia-on 10-15 L of oxygen-secondary to pulmonary edema on chest x-ray-discussed with daughter by attending MD-plans are to transition to hospice care.  FiO2 decreased to 8-10 L.  Significant studies: 10/25>> CXR: Patchy airspace opacities 10/25>> x-ray pelvis: No fracture 10/25>> x-ray right hand: No acute/dislocation. 10/25>> CT head: No acute intracranial abnormality 10/25>> CT maxillofacial: No fracture-large hematoma over left maxilla. 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  Consults: Nephrology Palliative care  Subjective: A bit more awake today.  Family at bedside.  Objective: Vitals: Blood pressure (!) 156/89, pulse 70, temperature 98.3 F (36.8 C), temperature source Oral, resp. rate 18, height 5' 2 (1.575 m), weight 57.8 kg,  SpO2 92%.   Exam: Not in any distress Bit more awake and alert compared to yesterday.   Pertinent Labs/Radiology:    Latest Ref Rng & Units 12/12/2023    5:46 AM 12/11/2023    3:10 AM 12/10/2023    7:57 PM  CBC  WBC 4.0 - 10.5 K/uL 7.2  8.8  9.6   Hemoglobin 12.0 - 15.0 g/dL 7.5  8.1  9.5   Hematocrit 36.0 - 46.0 % 23.0  25.6  29.9   Platelets 150 - 400 K/uL 228  263  323     Lab Results  Component Value Date   NA 135 12/12/2023   K 4.0 12/12/2023   CL 97 (L) 12/12/2023   CO2 25 12/12/2023      Assessment/Plan: Oliguric AKI on CKD 4 Did not tolerate trial of HD on 10/26-unfortunately developed pulm edema and on high flow oxygen No response to IV Lasix Remains oliguric-only 200 cc of urine overnight. Appreciate palliative care input-awaiting residential hospice bed.  Acute hypoxic respiratory failure Secondary to acute pulmonary edema in the setting of worsening AKI No response to high-dose IV Lasix-did not tolerate HD Remains on 8-9 L of HFNC Comfort measures ineffective Continue as needed Ativan/Dilaudid  Awaiting residential hospice bed.  Mechanical fall with left maxillary hematoma Supportive care  History of complete heart block-s/p PPM Supportive care  PAF Beta-blocker No role for anticoagulation  History of type B aortic dissection History of AAA with endoleak-s/p stenting Supportive care  HTN Continue metoprolol  for now  Goals of care/comfort care DNR Oliguric AKI-did not tolerate HD-no response to high-dose IV Lasix-now with pulm edema. Full comfort measures in effect-palliative care met with family 10/28-residential hospice bed pending.  Code status:  Code Status: Do not attempt resuscitation (DNR) - Comfort care   DVT Prophylaxis:    Family Communication: Daughter at bedside   Disposition Plan: Status is: Inpatient Remains inpatient appropriate because: Severity of illness   Planned Discharge Destination:Hospice  care   Diet: Diet Order             Diet general           Diet regular Fluid consistency: Thin  Diet effective now                     Antimicrobial agents: Anti-infectives (From admission, onward)    None        MEDICATIONS: Scheduled Meds:  Chlorhexidine  Gluconate Cloth  6 each Topical Daily   metoprolol  succinate  50 mg Oral Daily   sodium chloride  flush  10-40 mL Intracatheter Q12H   Continuous Infusions:   PRN Meds:.acetaminophen  **OR** acetaminophen , antiseptic oral rinse, artificial tears, glycopyrrolate **OR** glycopyrrolate **OR** glycopyrrolate, haloperidol **OR** haloperidol **OR** haloperidol lactate, HYDROmorphone  HCl, LORazepam, LORazepam, ondansetron  **OR** ondansetron  (ZOFRAN ) IV, sodium chloride  flush   I have personally reviewed following labs and imaging studies  LABORATORY DATA: CBC: Recent Labs  Lab 12/10/23 1940 12/10/23 1957 12/11/23 0310 12/12/23 0546  WBC  --  9.6 8.8 7.2  NEUTROABS  --  7.2  --   --   HGB 9.9* 9.5* 8.1* 7.5*  HCT 29.0* 29.9* 25.6* 23.0*  MCV  --  94.3 94.1 92.4  PLT  --  323 263 228    Basic Metabolic Panel: Recent Labs  Lab 12/10/23 1940 12/10/23 1957 12/11/23 0310 12/11/23 0550 12/11/23 0952 12/11/23 1348 12/12/23 0546  NA 132* 133* 133*  134*  --   --  134* 135  K 6.7* 6.6* 6.7*  6.6* 5.8* 5.8* 6.1* 4.0  CL 109 104 105  105  --   --  105 97*  CO2  --  14* 15*  15*  --   --  16* 25  GLUCOSE 136* 136* 110*  113*  --   --  99 109*  BUN 86* 92* 88*  87*  --   --  87* 54*  CREATININE 8.10* 7.40* 7.02*  7.04*  --   --  7.33* 5.61*  CALCIUM   --  10.3 9.9  9.8  --   --  9.8 8.9  MG  --   --  2.4  --   --   --   --   PHOS  --   --  4.1  4.1  --   --   --  4.2    GFR: Estimated Creatinine Clearance: 5.3 mL/min (A) (by C-G formula based on SCr of 5.61 mg/dL (H)).  Liver Function Tests: Recent Labs  Lab 12/10/23 1957 12/11/23 0310 12/12/23 0546  AST 27  --   --   ALT 22  --   --    ALKPHOS 112  --   --   BILITOT 0.5  --   --   PROT 6.5  --   --   ALBUMIN 3.1* 2.7* 2.7*   No results for input(s): LIPASE, AMYLASE in the last 168 hours. No results for input(s): AMMONIA in the last 168 hours.  Coagulation Profile: No results for input(s): INR, PROTIME in the last 168 hours.  Cardiac Enzymes: Recent Labs  Lab 12/10/23 1957 12/11/23 0310  CKTOTAL 58 56    BNP (last 3 results) No results for input(s): PROBNP in the last  8760 hours.  Lipid Profile: No results for input(s): CHOL, HDL, LDLCALC, TRIG, CHOLHDL, LDLDIRECT in the last 72 hours.  Thyroid Function Tests: No results for input(s): TSH, T4TOTAL, FREET4, T3FREE, THYROIDAB in the last 72 hours.  Anemia Panel: No results for input(s): VITAMINB12, FOLATE, FERRITIN, TIBC, IRON, RETICCTPCT in the last 72 hours.  Urine analysis:    Component Value Date/Time   COLORURINE AMBER (A) 12/11/2023 0303   APPEARANCEUR CLOUDY (A) 12/11/2023 0303   LABSPEC 1.019 12/11/2023 0303   PHURINE 5.0 12/11/2023 0303   GLUCOSEU NEGATIVE 12/11/2023 0303   HGBUR NEGATIVE 12/11/2023 0303   BILIRUBINUR NEGATIVE 12/11/2023 0303   KETONESUR NEGATIVE 12/11/2023 0303   PROTEINUR 30 (A) 12/11/2023 0303   NITRITE NEGATIVE 12/11/2023 0303   LEUKOCYTESUR MODERATE (A) 12/11/2023 0303    Sepsis Labs: Lactic Acid, Venous    Component Value Date/Time   LATICACIDVEN 0.8 12/10/2023 1941    MICROBIOLOGY: No results found for this or any previous visit (from the past 240 hours).  RADIOLOGY STUDIES/RESULTS: No results found.    LOS: 4 days   Donalda Applebaum, MD  [facility]    To contact the attending provider between 7A-7P or the covering provider during after hours 7P-7A, please log into the web site [email or web address] and access using universal [place] password for that web site. If you do not have the password, please call the hospital operator.  12/14/2023, 8:44  AM

## 2023-12-22 ENCOUNTER — Encounter (INDEPENDENT_AMBULATORY_CARE_PROVIDER_SITE_OTHER): Payer: Self-pay | Admitting: Gastroenterology

## 2024-01-16 DEATH — deceased

## 2024-01-19 ENCOUNTER — Ambulatory Visit

## 2024-04-19 ENCOUNTER — Ambulatory Visit

## 2024-05-07 ENCOUNTER — Ambulatory Visit: Admitting: Surgery

## 2024-07-19 ENCOUNTER — Ambulatory Visit

## 2024-10-18 ENCOUNTER — Ambulatory Visit

## 2025-01-17 ENCOUNTER — Ambulatory Visit
